# Patient Record
Sex: Male | Born: 1965 | Race: Black or African American | Hispanic: No | Marital: Married | Smoking: Never smoker
Health system: Southern US, Community
[De-identification: ages and names within clinical notes are randomized; demographics above are authoritative.]

## PROBLEM LIST (undated history)

## (undated) DIAGNOSIS — E669 Obesity, unspecified: Secondary | ICD-10-CM

## (undated) DIAGNOSIS — Z87442 Personal history of urinary calculi: Secondary | ICD-10-CM

## (undated) DIAGNOSIS — D509 Iron deficiency anemia, unspecified: Secondary | ICD-10-CM

## (undated) DIAGNOSIS — D649 Anemia, unspecified: Secondary | ICD-10-CM

## (undated) DIAGNOSIS — J45909 Unspecified asthma, uncomplicated: Secondary | ICD-10-CM

## (undated) DIAGNOSIS — K219 Gastro-esophageal reflux disease without esophagitis: Secondary | ICD-10-CM

## (undated) DIAGNOSIS — E785 Hyperlipidemia, unspecified: Secondary | ICD-10-CM

## (undated) DIAGNOSIS — F419 Anxiety disorder, unspecified: Secondary | ICD-10-CM

## (undated) DIAGNOSIS — M1990 Unspecified osteoarthritis, unspecified site: Secondary | ICD-10-CM

## (undated) DIAGNOSIS — I10 Essential (primary) hypertension: Secondary | ICD-10-CM

## (undated) DIAGNOSIS — K5790 Diverticulosis of intestine, part unspecified, without perforation or abscess without bleeding: Secondary | ICD-10-CM

## (undated) HISTORY — DX: Iron deficiency anemia, unspecified: D50.9

## (undated) HISTORY — DX: Obesity, unspecified: E66.9

## (undated) HISTORY — DX: Anemia, unspecified: D64.9

## (undated) HISTORY — DX: Diverticulosis of intestine, part unspecified, without perforation or abscess without bleeding: K57.90

## (undated) HISTORY — DX: Unspecified asthma, uncomplicated: J45.909

## (undated) HISTORY — DX: Hyperlipidemia, unspecified: E78.5

## (undated) HISTORY — DX: Essential (primary) hypertension: I10

## (undated) HISTORY — DX: Unspecified osteoarthritis, unspecified site: M19.90

---

## 1992-11-18 HISTORY — PX: HERNIA REPAIR: SHX51

## 2010-07-29 ENCOUNTER — Ambulatory Visit: Payer: Self-pay | Admitting: Family Medicine

## 2010-09-03 ENCOUNTER — Encounter: Payer: Self-pay | Admitting: Family Medicine

## 2010-09-03 ENCOUNTER — Ambulatory Visit: Payer: Self-pay | Admitting: Internal Medicine

## 2010-09-03 DIAGNOSIS — Z6835 Body mass index (BMI) 35.0-35.9, adult: Secondary | ICD-10-CM

## 2010-09-03 DIAGNOSIS — I10 Essential (primary) hypertension: Secondary | ICD-10-CM

## 2010-09-03 DIAGNOSIS — E669 Obesity, unspecified: Secondary | ICD-10-CM | POA: Insufficient documentation

## 2010-09-05 ENCOUNTER — Encounter: Payer: Self-pay | Admitting: Family Medicine

## 2010-09-05 DIAGNOSIS — D509 Iron deficiency anemia, unspecified: Secondary | ICD-10-CM | POA: Insufficient documentation

## 2010-12-18 NOTE — Assessment & Plan Note (Signed)
Summary: FLU SHOT/EVM   Vital Signs:  Patient Profile:   45 Years Old Male CC:      flu shot Temp:     98.3 degrees F oral                  Current Allergies: ! * NUTSHistory of Present Illness Reason for visit: Flu shot Chief Complaint: flu shot    Social History: Married Works at [employer]  The patient and/or caregiver has been counseled thoroughly with regard to medications prescribed including dosage, schedule, interactions, rationale for use, and possible side effects and they verbalize understanding.  Diagnoses and expected course of recovery discussed and will return if not improved as expected or if the condition worsens. Patient and/or caregiver verbalized understanding.   Orders Added: 1)  Flu Vaccine 8yrs + [90658] 2)  Admin 1st Vaccine [90471] 3)  Admin 1st Vaccine [facility]) [90471S]   Influenza Vaccine    Vaccine Type: Fluvax 3+    Site: right deltoid    Mfr: GlaxoSmithKline    Dose: 0.5 ml    Route: IM    Given by: Providence Crosby LPN    Exp. Date: 03/2011    Lot #: [id number]    VIS given: 06/12/10 version given July 29, 2010.  Flu Vaccine Consent Questions    Do you have a history of severe allergic reactions to this vaccine? no    Any prior history of allergic reactions to egg and/or gelatin? no    Do you have a sensitivity to the preservative Thimersol? no    Do you have a past history of Guillan-Barre Syndrome? no    Do you currently have an acute febrile illness? no    Have you ever had a severe reaction to latex? no    Vaccine information given and explained to patient? yes

## 2010-12-18 NOTE — Letter (Signed)
Summary: [facility]: Immunoassay Fecal Occult Blood (iFOB) Order Form  [place] [facility]  [address] [place], [place] [address]   Phone: [phone]  Fax: [phone]      [facility]: Immunoassay Fecal Occult Blood (iFOB) Order Form   September 05, 2010 MRN: [id number]   Anwar Teague 05-Jun-1966   Physicican Name:_________________________  Diagnosis Code:_________790.09_________________      Eustaquio Boyden  MD

## 2010-12-18 NOTE — Assessment & Plan Note (Signed)
Summary: CPE   Vital Signs:  Patient profile:   45 year old male Height:      68.25 inches Weight:      242.25 pounds BMI:     36.70 Temp:     98.7 degrees F oral Pulse rate:   96 / minute Pulse rhythm:   regular BP sitting:   150 / 100  (left arm) Cuff size:   large  Vitals Entered By: Selena Batten Dance CMA Duncan Dull) (September 03, 2010 9:35 AM)  Serial Vital Signs/Assessments:  Time      Position  BP       Pulse  Resp  Temp     By                     152/100                        [name] Boyden  MD  CC: New patient to establish care Comments Patient is fasting and wants CPx   History of Present Illness: CC: new patient, requests CPE today.  1. elevated BP today.  no h/o HTN, never on meds.  no HA, vision changes, chest pain, tightness, urinary changes, LE swelling.    2. obesity - walking 62min/day.  thinks eats healthy.   3. bioscreen through job last month, not fasting.  told may have elevated cholesterol  preventative - had prostate checked 4 years ago, told normal.  would like recheck again as next month will be 44. colon screening - no early fmhx, no changes in stools.  tetanus shot - last done >10 years ago. flu shot - had this year already.  Current Medications (verified): 1)  None  Allergies: 1)  ! * Peanuts  Past History:  Past Medical History: ?HTN  ?HLD  asthma as child  Past Surgical History: hernia repair 1994  Family History: M: cervical CA F: alive and well MGF: CAD/MI at 46yo  No HTN, HLD, DM, CVA, other CA  Social History: No smoking, no etOH, no rec drugs Occupation: Diplomatic Services operational officer, billing specialist Lives with wife and son  Review of Systems  The patient denies anorexia, fever, weight loss, weight gain, vision loss, decreased hearing, hoarseness, chest pain, syncope, dyspnea on exertion, peripheral edema, prolonged cough, headaches, hemoptysis, abdominal pain, melena, hematochezia, severe indigestion/heartburn, hematuria, incontinence,  muscle weakness, suspicious skin lesions, transient blindness, difficulty walking, depression, and testicular masses.    Physical Exam  General:  Well-developed,well-nourished,in no acute distress; alert,appropriate and cooperative throughout examination Head:  Normocephalic and atraumatic without obvious abnormalities. No apparent alopecia or balding. Eyes:  No corneal or conjunctival inflammation noted. EOMI. Perrla.  Ears:  External ear exam shows no significant lesions or deformities.  Otoscopic examination reveals clear canals, tympanic membranes are intact bilaterally without bulging, retraction, inflammation or discharge. Hearing is grossly normal bilaterally. Nose:  External nasal examination shows no deformity or inflammation. Nasal mucosa are pink and moist without lesions or exudates. Mouth:  Oral mucosa and oropharynx without lesions or exudates.  Teeth in good repair. Neck:  No deformities, masses, or tenderness noted. Lungs:  Normal respiratory effort, chest expands symmetrically. Lungs are clear to auscultation, no crackles or wheezes. Heart:  Normal rate and regular rhythm. S1 and S2 normal without gallop, murmur, click, rub or other extra sounds. Abdomen:  Bowel sounds positive,abdomen soft and non-tender without masses, organomegaly or hernias noted.  obese Rectal:  No external abnormalities noted. Normal sphincter tone. No  rectal masses or tenderness. Prostate:  Prostate gland firm and smooth, no enlargement, nodularity, tenderness, mass, asymmetry or induration. Msk:  No deformity or scoliosis noted of thoracic or lumbar spine.   Pulses:  2+ rad pulses Extremities:  no edema Neurologic:  CN grossly intact, station and gait intact Skin:  no rash/jaundice Psych:  pleasant, full affect   Impression & Recommendations:  Problem # 1:  HEALTH MAINTENANCE EXAM (ICD-V70.0) Reviewed preventive care protocols, scheduled due services, and updated immunizations.  Tdap today.   Prostate check today.  Orders: TLB-CBC Platelet - w/Differential (85025-CBCD)  Problem # 2:  SPECIAL SCREENING MALIGNANT NEOPLASM OF PROSTATE (ICD-V76.44)  DRE reassuring, check PSA.  Orders: TLB-PSA (Prostate Specific Antigen) (84153-PSA)  Problem # 3:  ELEVATED BP READING WITHOUT DX HYPERTENSION (ICD-796.2) pt prefers to stay away from meds if possible, requests a few months to try lifestyle cahnges, increased exercise and diet modifications (rec eat more fruits/vegetables, stay away from salt - 2gm Na per day, less juices for weight loss).  RTC 2-3 mo for f/u.  If remains elevated, rec start med.  Likely start HCTZ.  basic blood work today. Orders: TLB-Lipid Panel (80061-LIPID) TLB-BMP (Basic Metabolic Panel-BMET) (80048-METABOL) TLB-Hepatic/Liver Function Pnl (80076-HEPATIC) TLB-TSH (Thyroid Stimulating Hormone) (84443-TSH) TLB-CBC Platelet - w/Differential (85025-CBCD)  BP today: 150/100  Instructed in low sodium diet (DASH Handout) and behavior modification.    Problem # 4:  OBESITY (ICD-278.00) see above.  rec weight loss, increased exercise (currently walking/day), increase fruits/vegetables, decreased salt.  Orders: TLB-CBC Platelet - w/Differential (85025-CBCD)  Ht: 68.25 (09/03/2010)   Wt: 242.25 (09/03/2010)   BMI: 36.70 (09/03/2010)  Other Orders: Tdap => 74yrs IM (32951) Admin 1st Vaccine (88416)  Patient Instructions: 1)  Return in 2-3 months for follow up blood pressure. 2)  Tetanus shot today. 3)  PSA level with cholesterol, as well as sugar to check at [facility]. 4)  Keep track of blood pressure for next couple months. 5)  Limit sodium in your diet to 2000mg /day.  Increase potassium in diet (more fruits and vegetable). 6)  If blood pressure consistently >150/100, I'd probably want to see you sooner.   7)  Good to meet you today, call clinic with questions.   Orders Added: 1)  TLB-Lipid Panel [80061-LIPID] 2)  TLB-BMP (Basic Metabolic Panel-BMET)  [80048-METABOL] 3)  TLB-Hepatic/Liver Function Pnl [80076-HEPATIC] 4)  TLB-TSH (Thyroid Stimulating Hormone) [84443-TSH] 5)  TLB-PSA (Prostate Specific Antigen) [60630-ZSW] 6)  TLB-CBC Platelet - w/Differential [85025-CBCD] 7)  New Patient 40-64 years [99386] 8)  Tdap => 65yrs IM [90715] 9)  Admin 1st Vaccine [90471]   Immunizations Administered:  Tetanus Vaccine:    Vaccine Type: Tdap    Site: left deltoid    Mfr: GlaxoSmithKline    Dose: 0.5 ml    Route: IM    Given by: Selena Batten Dance CMA (AAMA)    Exp. Date: 09/06/2012    Lot #: [id number]    VIS given: 10/05/08 version given September 03, 2010.   Immunizations Administered:  Tetanus Vaccine:    Vaccine Type: Tdap    Site: left deltoid    Mfr: GlaxoSmithKline    Dose: 0.5 ml    Route: IM    Given by: Selena Batten Dance CMA (AAMA)    Exp. Date: 09/06/2012    Lot #: [id number]    VIS given: 10/05/08 version given September 03, 2010.  Prior Medications: None Current Allergies (reviewed today): ! * PEANUTS

## 2011-08-26 ENCOUNTER — Encounter: Payer: Self-pay | Admitting: Family Medicine

## 2011-08-27 ENCOUNTER — Encounter: Payer: Self-pay | Admitting: Family Medicine

## 2011-08-27 ENCOUNTER — Ambulatory Visit (INDEPENDENT_AMBULATORY_CARE_PROVIDER_SITE_OTHER): Payer: 59 | Admitting: Family Medicine

## 2011-08-27 DIAGNOSIS — I10 Essential (primary) hypertension: Secondary | ICD-10-CM

## 2011-08-27 DIAGNOSIS — J329 Chronic sinusitis, unspecified: Secondary | ICD-10-CM

## 2011-08-27 MED ORDER — FLUTICASONE PROPIONATE 50 MCG/ACT NA SUSP: 2.0000 | Freq: Every day | NASAL | Status: DC

## 2011-08-27 MED ORDER — HYDROCHLOROTHIAZIDE 12.5 MG PO CAPS: 12.5000 mg | ORAL_CAPSULE | Freq: Every day | ORAL | Status: DC

## 2011-08-27 NOTE — Assessment & Plan Note (Signed)
Going on 6 days. Likely viral. Supportive care. Treat with flonase as well given turbinate swelling. Update if red flags, call us end of week if not improving as expected for abx course.

## 2011-08-27 NOTE — Patient Instructions (Signed)
You have a sinus infection. Take medicine as prescribed: flonase Push fluids and plenty of rest. Nasal saline irrigation or neti pot to help drain sinuses. May use simple mucinex with plenty of fluid to help mobilize mucous. Let me know if fever >101.5, trouble opening/closing mouth, difficulty swallowing, or worsening. For blood pressure - start hydrochlorothiazide 12.5mg  once daily for blood pressure.  Limit salt intake to <1.5gm sodium/day.  Drink plenty of water.  Weight loss would help with blood pressure as well. Call me late this week if things not improving as expected.

## 2011-08-27 NOTE — Assessment & Plan Note (Signed)
Several elevated readings in last year.   Start HCTZ. Discussed low salt diet.

## 2011-08-27 NOTE — Progress Notes (Signed)
  Subjective:    Patient ID: [name] [name], male    DOB: 10-Oct-1966, 45 y.o.   MRN: [id number]  HPI CC: f/u HTN  Health screenings at work - bp elevated.  Asked to come in for check.  No HA, vision changes, CP/tightness, SOB, leg swelling.  Was high at last year's CPE, never returned for f/u.  Sinus - noticed 7days ago.  Sinus congestion, pressure, worse with sleep at night with sore gums, body aches, pain.  Tylenol not helping.  Using pseudophed as well.  Hasn't tried nasal saline.  No sick contacts at home.  No smokers at home.  H/o asthma as child, grew out of this.  No cough, abd pain.  Scheduled to return for cpe in next few months.  Never returned stool kit last year.  Was microcytic without anemia.  Review of Systems Per HPI    Objective:   Physical Exam  Nursing note and vitals reviewed. Constitutional: He appears well-developed and well-nourished. No distress.  HENT:  Head: Normocephalic and atraumatic.  Right Ear: Hearing, tympanic membrane, external ear and ear canal normal.  Left Ear: Hearing, tympanic membrane, external ear and ear canal normal.  Nose: Mucosal edema present. No rhinorrhea. Right sinus exhibits frontal sinus tenderness. Right sinus exhibits no maxillary sinus tenderness. Left sinus exhibits frontal sinus tenderness. Left sinus exhibits no maxillary sinus tenderness.  Mouth/Throat: Uvula is midline, oropharynx is clear and moist and mucous membranes are normal. No oropharyngeal exudate, posterior oropharyngeal edema, posterior oropharyngeal erythema or tonsillar abscesses.       Mild sinus pressure R>L turbinate swelling  Eyes: Conjunctivae and EOM are normal. Pupils are equal, round, and reactive to light. No scleral icterus.  Neck: Normal range of motion. Neck supple. Carotid bruit is not present. No thyromegaly present.  Cardiovascular: Normal rate, regular rhythm, normal heart sounds and intact distal pulses.   No murmur heard. Pulmonary/Chest: Effort  normal and breath sounds normal. No respiratory distress. He has no wheezes. He has no rales.  Abdominal: Soft. Bowel sounds are normal.       No abd/renal bruits  Musculoskeletal: He exhibits no edema.  Lymphadenopathy:    He has no cervical adenopathy.  Skin: Skin is warm and dry. No rash noted.  Psychiatric: He has a normal mood and affect.          Assessment & Plan:

## 2011-08-29 ENCOUNTER — Telehealth: Payer: Self-pay | Admitting: *Deleted

## 2011-08-29 MED ORDER — AMOXICILLIN-POT CLAVULANATE 875-125 MG PO TABS: 1.0000 | ORAL_TABLET | Freq: Two times a day (BID) | ORAL | Status: AC

## 2011-08-29 NOTE — Telephone Encounter (Signed)
Will send in course of augmentin twice daily for 10 days. Has he tried ibuprfoen/advil for pain?  I'd recommend 400-600mg  2-3 times/day.  Let me know if he wants something stronger.

## 2011-08-29 NOTE — Telephone Encounter (Signed)
Pt was seen on 10/9 for sinusitis. He says he is not any better, his teeth are hurting and he is requesting an antibiotic and something for pain.  Says he discussed this with you at his visit.  Uses [facility] [address].

## 2011-08-29 NOTE — Telephone Encounter (Signed)
Message left on VM advising patient of Rx and to try 400-600 mg of ibuprofen for pain and that should help in combination with the abx. I instructed him to call back if that doesn't help or if he has any questions or concerns.

## 2011-09-09 ENCOUNTER — Encounter: Payer: Self-pay | Admitting: Family Medicine

## 2011-09-23 ENCOUNTER — Ambulatory Visit (INDEPENDENT_AMBULATORY_CARE_PROVIDER_SITE_OTHER): Payer: 59 | Admitting: Family Medicine

## 2011-09-23 ENCOUNTER — Encounter: Payer: Self-pay | Admitting: Family Medicine

## 2011-09-23 DIAGNOSIS — Z0000 Encounter for general adult medical examination without abnormal findings: Secondary | ICD-10-CM | POA: Insufficient documentation

## 2011-09-23 DIAGNOSIS — E785 Hyperlipidemia, unspecified: Secondary | ICD-10-CM | POA: Insufficient documentation

## 2011-09-23 DIAGNOSIS — I10 Essential (primary) hypertension: Secondary | ICD-10-CM

## 2011-09-23 DIAGNOSIS — R718 Other abnormality of red blood cells: Secondary | ICD-10-CM

## 2011-09-23 DIAGNOSIS — Z0001 Encounter for general adult medical examination with abnormal findings: Secondary | ICD-10-CM | POA: Insufficient documentation

## 2011-09-23 DIAGNOSIS — Z1211 Encounter for screening for malignant neoplasm of colon: Secondary | ICD-10-CM

## 2011-09-23 MED ORDER — HYDROCHLOROTHIAZIDE 25 MG PO TABS: 25.0000 mg | ORAL_TABLET | Freq: Every day | ORAL | Status: DC

## 2011-09-23 NOTE — Assessment & Plan Note (Signed)
Increase HCTZ to 25mg  daily as still somewhat elevated. rtc 3 mo for /fu.

## 2011-09-23 NOTE — Assessment & Plan Note (Signed)
Reviewed last year's numbers. Advised to get blood work fasting. Goal for him <130, ideally <100.  (obesity, HTN, fmhx). Low chol diet provided today.

## 2011-09-23 NOTE — Progress Notes (Signed)
Subjective:    Patient ID: [name] [name], male    DOB: Oct 24, 1966, 45 y.o.   MRN: [id number]  HPI CC: CPE  [name] [name] presents today for CPE.    Started on HCTZ last month for elevated bp readings and dx HTN.    Good diet - fish 2x/wk, limits red meat to 2/wk.  Tries to eat sweet potatoes instead of rice, wheat instead of white.  Tries to stay active by walking 29min/day.  Thinks could increase amount of walking.  Thinks portion sizes are all right.  Wt Readings from Last 3 Encounters:  09/23/11 247 lb 1.9 oz (112.093 kg)  08/27/11 244 lb (110.678 kg)  09/03/10 242 lb 4 oz (109.884 kg)   Found to have microcytosis without anemia last year, never returned iFOB or had iron panel drawn.  States will do this year.  Preventative Prostate - desires check yearly. colon screening - no early fmhx, no changes in stools.   Did not turn in iFOB last year, states will turn in today. tetanus shot - 2011 flu shot - at [facility].  Asks if I can sign temporary handicap placard for father who will be visiting this winter.  Advised to mail to his father's physician to sign, if any problems, to let me know.  Medications and allergies reviewed and updated in chart.  Past histories reviewed and updated if relevant as below. Patient Active Problem List  Diagnoses  . OBESITY  . MICROCYTOSIS  . HTN (hypertension)  . Sinusitis   Past Medical History  Diagnosis Date  . HTN (hypertension)   . Microcytosis   . Obesity, unspecified   . HLD (hyperlipidemia)   . Childhood asthma    Past Surgical History  Procedure Date  . Hernia repair 1994   History  Substance Use Topics  . Smoking status: Never Smoker   . Smokeless tobacco: Not on file  . Alcohol Use: No   Family History  Problem Relation Age of Onset  . Cervical cancer Mother   . Healthy Father   . Coronary artery disease Maternal Grandfather   . Heart attack Maternal Grandfather 56  . Hypertension Neg Hx   . Hyperlipidemia Neg Hx     . Diabetes Neg Hx   . Stroke Neg Hx    Allergies  Allergen Reactions  . Peanut-Containing Drug Products     REACTION: swelling   Current Outpatient Prescriptions on File Prior to Visit  Medication Sig Dispense Refill  . fluticasone (FLONASE) 50 MCG/ACT nasal spray Place 2 sprays into the nose daily.  16 g  3   Review of Systems  Constitutional: Negative for fever, chills, activity change, appetite change, fatigue and unexpected weight change.  HENT: Negative for hearing loss and neck pain.   Eyes: Negative for visual disturbance.  Respiratory: Negative for cough, chest tightness, shortness of breath and wheezing.   Cardiovascular: Negative for chest pain, palpitations and leg swelling.  Gastrointestinal: Negative for nausea, vomiting, abdominal pain, diarrhea, constipation, blood in stool and abdominal distention.  Genitourinary: Negative for hematuria and difficulty urinating.  Musculoskeletal: Negative for myalgias and arthralgias.  Skin: Negative for rash.  Neurological: Negative for dizziness, seizures, syncope and headaches.  Hematological: Does not bruise/bleed easily.  Psychiatric/Behavioral: Negative for dysphoric mood. The patient is not nervous/anxious.        Objective:   Physical Exam  Nursing note and vitals reviewed. Constitutional: He is oriented to person, place, and time. He appears well-developed and well-nourished. No distress.  HENT:  Head: Normocephalic and atraumatic.  Right Ear: External ear normal.  Left Ear: External ear normal.  Nose: Nose normal.  Mouth/Throat: Oropharynx is clear and moist. No oropharyngeal exudate.  Eyes: Conjunctivae and EOM are normal. Pupils are equal, round, and reactive to light. No scleral icterus.  Neck: Normal range of motion. Neck supple. No thyromegaly present.  Cardiovascular: Normal rate, regular rhythm, normal heart sounds and intact distal pulses.   No murmur heard. Pulses:      Radial pulses are 2+ on the right  side, and 2+ on the left side.  Pulmonary/Chest: Effort normal and breath sounds normal. No respiratory distress. He has no wheezes. He has no rales.  Abdominal: Soft. Bowel sounds are normal. He exhibits no distension and no mass. There is no tenderness. There is no rebound and no guarding.  Genitourinary: Prostate normal. Rectal exam shows external hemorrhoid. Rectal exam shows no internal hemorrhoid, no fissure, no mass, no tenderness and anal tone normal. Guaiac negative stool. Prostate is not enlarged and not tender.       20-30gm prostate  Musculoskeletal: Normal range of motion.  Lymphadenopathy:    He has no cervical adenopathy.  Neurological: He is alert and oriented to person, place, and time.       CN grossly intact, station and gait intact  Skin: Skin is warm and dry. No rash noted.  Psychiatric: He has a normal mood and affect. His behavior is normal. Judgment and thought content normal.      Assessment & Plan:

## 2011-09-23 NOTE — Patient Instructions (Addendum)
Increase hydrochlorothiazide to 25mg  daily (2 pills daily, new dose will be 25mg  so will need only one pill a day). Your bad cholesterol was 158 last year, goal for you is <130.  Low cholesterol diet provided today. Return in 3 months for follow up blood pressure. Stool kit provided today. Good to see you today, call us with questions.

## 2011-09-23 NOTE — Assessment & Plan Note (Signed)
Never f/u last year.  Set up with iron panel as well as iFOB today. No fmhx colon cancer.  Hemoccult negative today.

## 2011-09-23 NOTE — Assessment & Plan Note (Addendum)
Reviewed preventative protocols and updated. Requests yearly prostate screen.  DRE today, to get PSA at [facility]. tdap last year 2011 Flu this year at [facility] Sent iFOB to [facility]. Discussed healthy living and diet, recommended low cholesterol diet.

## 2011-09-30 ENCOUNTER — Encounter: Payer: Self-pay | Admitting: Family Medicine

## 2011-10-01 ENCOUNTER — Telehealth: Payer: Self-pay | Admitting: Family Medicine

## 2011-10-01 NOTE — Telephone Encounter (Signed)
Patient notified. He said he sent in the iFOB on Thursday of last week, so we should be getting results soon. He said he wanted to investigate GI doctors and check with his insurance. He said he will call me back with where he wants to go. Copy of labs mailed to patient as requested.

## 2011-10-01 NOTE — Telephone Encounter (Signed)
Please notify has mild anemia - with low iron stores.  Has he turned in iFOB to [facility]?  Because I have not received results yet.  I would like to set him up with a screening colonoscopy given blood count has decreased some compared to last year. Kidneys, sugar, prostate normal. Cholesterol levels improved from last year, still recommend low cholesterol diet. Offer to send copy of blood work.

## 2011-10-01 NOTE — Telephone Encounter (Signed)
Message left for patient to return my call.  

## 2011-10-01 NOTE — Telephone Encounter (Signed)
Noted  

## 2011-10-14 ENCOUNTER — Telehealth: Payer: Self-pay | Admitting: *Deleted

## 2011-10-14 DIAGNOSIS — R718 Other abnormality of red blood cells: Secondary | ICD-10-CM

## 2011-10-14 DIAGNOSIS — Z1211 Encounter for screening for malignant neoplasm of colon: Secondary | ICD-10-CM

## 2011-10-14 NOTE — Telephone Encounter (Signed)
Patient dropped off temporary handicapped placard paperwork for his father.

## 2011-10-14 NOTE — Telephone Encounter (Signed)
And pt states he wants to go to Dr. Niel Hummer in [place] for colonoscopy. Pt states checked with father's doctor who didn't want to fill out handicap placard. Filled out temporary placard for 6 mo.  Indication - unable to ambulate without use of cane.

## 2011-11-06 ENCOUNTER — Encounter: Payer: Self-pay | Admitting: Radiology

## 2011-11-18 ENCOUNTER — Encounter: Payer: Self-pay | Admitting: Family Medicine

## 2012-10-19 ENCOUNTER — Other Ambulatory Visit: Payer: Self-pay | Admitting: Family Medicine

## 2012-10-19 ENCOUNTER — Encounter: Payer: Self-pay | Admitting: Family Medicine

## 2012-10-19 ENCOUNTER — Ambulatory Visit (INDEPENDENT_AMBULATORY_CARE_PROVIDER_SITE_OTHER): Payer: 59 | Admitting: Family Medicine

## 2012-10-19 VITALS — BP 136/82 | HR 94 | Temp 98.2°F | Ht 69.0 in | Wt 243.8 lb

## 2012-10-19 DIAGNOSIS — Z1211 Encounter for screening for malignant neoplasm of colon: Secondary | ICD-10-CM

## 2012-10-19 DIAGNOSIS — E785 Hyperlipidemia, unspecified: Secondary | ICD-10-CM

## 2012-10-19 DIAGNOSIS — I10 Essential (primary) hypertension: Secondary | ICD-10-CM

## 2012-10-19 DIAGNOSIS — Z0000 Encounter for general adult medical examination without abnormal findings: Secondary | ICD-10-CM

## 2012-10-19 DIAGNOSIS — D509 Iron deficiency anemia, unspecified: Secondary | ICD-10-CM

## 2012-10-19 DIAGNOSIS — E669 Obesity, unspecified: Secondary | ICD-10-CM

## 2012-10-19 LAB — POC HEMOCCULT BLD/STL (OFFICE/1-CARD/DIAGNOSTIC): Fecal Occult Blood, POC: NEGATIVE

## 2012-10-19 MED ORDER — HYDROCHLOROTHIAZIDE 25 MG PO TABS: 25.0000 mg | ORAL_TABLET | Freq: Every day | ORAL | Status: DC

## 2012-10-19 NOTE — Patient Instructions (Addendum)
Get blood work at [facility] - fasting. I recommend colonoscopy for iron deficiency anemia found last year. Return in 1 year or as needed. Good to see you today, call us with questions.

## 2012-10-19 NOTE — Assessment & Plan Note (Signed)
Discussed concerns with this dx based on last year's blood work.  Pt has not had colonoscopy done yet.  Again recommended do this.  Pt opts to start with blood work again, aware of concern for colon cancer among other dx's. Will again refer to GI for IDA. Stool kit neg in office today.

## 2012-10-19 NOTE — Assessment & Plan Note (Signed)
Chronic.  Stable, continue HCTZ 25mg  daily.  Refilled today.

## 2012-10-19 NOTE — Progress Notes (Signed)
Subjective:    Patient ID: [name] [name], male    DOB: October 29, 1966, 46 y.o.   MRN: [id number]  HPI CC: CPE  H/o IDA - found last year.  Pt was advised to go to GI for colonoscopy - has not done.  Prior wanted to go to Dr. Niel Hummer.  Denies bleeding from anywhere.  Endorses good red meat intake.  Preventative  Prostate - desires check yearly.   colon screening - no early fmhx, no changes in stools. Did not turn in iFOB last year, states will turn in today.  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 Diet: overall healthy per pt, good water, fruits/vegetables daily, fish 1x/wk  Wt Readings from Last 3 Encounters:  10/19/12 243 lb 12 oz (110.564 kg)  09/23/11 247 lb 1.9 oz (112.093 kg)  08/27/11 244 lb (110.678 kg)    Medications and allergies reviewed and updated in chart.  Past histories reviewed and updated if relevant as below. Patient Active Problem List  Diagnosis  . OBESITY  . MICROCYTOSIS  . HTN (hypertension)  . Sinusitis  . Healthcare maintenance  . HLD (hyperlipidemia)   Past Medical History  Diagnosis Date  . HTN (hypertension)   . Microcytosis   . Obesity, unspecified   . HLD (hyperlipidemia)   . Childhood asthma    Past Surgical History  Procedure Date  . Hernia repair 1994   History  Substance Use Topics  . Smoking status: Never Smoker   . Smokeless tobacco: Not on file  . Alcohol Use: No   Family History  Problem Relation Age of Onset  . Cervical cancer Mother   . Healthy Father   . Coronary artery disease Maternal Grandfather   . Heart attack Maternal Grandfather 56  . Hypertension Neg Hx   . Hyperlipidemia Neg Hx   . Diabetes Neg Hx   . Stroke Neg Hx    Allergies  Allergen Reactions  . Peanut-Containing Drug Products     REACTION: swelling   Current Outpatient Prescriptions on File Prior to Visit  Medication Sig  Dispense Refill  . fluticasone (FLONASE) 50 MCG/ACT nasal spray Place 2 sprays into the nose daily.  16 g  3  . hydrochlorothiazide (HYDRODIURIL) 25 MG tablet Take 1 tablet (25 mg total) by mouth daily.  90 tablet  3     Review of Systems  Constitutional: Negative for fever, chills, activity change, appetite change, fatigue and unexpected weight change.  HENT: Negative for hearing loss and neck pain.   Eyes: Negative for visual disturbance.  Respiratory: Negative for cough, chest tightness, shortness of breath and wheezing.   Cardiovascular: Negative for chest pain, palpitations and leg swelling.  Gastrointestinal: Negative for nausea, vomiting, abdominal pain, diarrhea, constipation, blood in stool and abdominal distention.  Genitourinary: Negative for hematuria and difficulty urinating.  Musculoskeletal: Negative for myalgias and arthralgias.  Skin: Negative for rash.  Neurological: Negative for dizziness, seizures, syncope and headaches.  Hematological: Does not bruise/bleed easily.  Psychiatric/Behavioral: Negative for dysphoric mood. The patient is not nervous/anxious.        Objective:   Physical Exam  Nursing note and vitals reviewed. Constitutional: He is oriented to person, place, and time. He appears well-developed and well-nourished. No distress.  HENT:  Head: Normocephalic and atraumatic.  Right Ear: Hearing, tympanic membrane, external ear and ear canal normal.  Left Ear: Hearing, tympanic  membrane, external ear and ear canal normal.  Nose: Nose normal.  Mouth/Throat: Oropharynx is clear and moist. No oropharyngeal exudate.  Eyes: Conjunctivae normal and EOM are normal. Pupils are equal, round, and reactive to light. No scleral icterus.  Neck: Normal range of motion. Neck supple. No thyromegaly present.  Cardiovascular: Normal rate, regular rhythm, normal heart sounds and intact distal pulses.   No murmur heard. Pulses:      Radial pulses are 2+ on the right side, and  2+ on the left side.  Pulmonary/Chest: Effort normal and breath sounds normal. No respiratory distress. He has no wheezes. He has no rales.  Abdominal: Soft. Bowel sounds are normal. He exhibits no distension and no mass. There is no tenderness. There is no rebound and no guarding.  Genitourinary: Prostate normal. Rectal exam shows external hemorrhoid (noninflamed). Rectal exam shows no internal hemorrhoid, no fissure, no mass, no tenderness and anal tone normal. Guaiac negative stool. Prostate is not enlarged (20gm) and not tender.  Musculoskeletal: Normal range of motion. He exhibits no edema.  Lymphadenopathy:    He has no cervical adenopathy.  Neurological: He is alert and oriented to person, place, and time.       CN grossly intact, station and gait intact  Skin: Skin is warm and dry. No rash noted. No pallor.  Psychiatric: He has a normal mood and affect. His behavior is normal. Judgment and thought content normal.       Assessment & Plan:

## 2012-10-19 NOTE — Assessment & Plan Note (Signed)
Preventative protocols reviewed and updated unless pt declined. Discussed healthy diet and lifestyle.  

## 2012-10-19 NOTE — Assessment & Plan Note (Signed)
Mild, diet controlled.  Goal LDL <130, ideally lower given fmhx.

## 2012-10-19 NOTE — Assessment & Plan Note (Signed)
Encourage continued weight loss through healthy diet and increased activity.

## 2012-10-22 LAB — BASIC METABOLIC PANEL
CO2: 24 mmol/L (ref 19–28)
Calcium: 9.6 mg/dL (ref 8.7–10.2)
Creatinine, Ser: 1.38 mg/dL — ABNORMAL HIGH (ref 0.76–1.27)
GFR calc non Af Amer: 60 mL/min/{1.73_m2} (ref >59)
Glucose: 97 mg/dL (ref 65–99)
Potassium: 4 mmol/L (ref 3.5–5.2)
Sodium: 138 mmol/L (ref 134–144)

## 2012-10-22 LAB — CBC/DIFF AMBIGUOUS DEFAULT
Basophils Absolute: 0 10*3/uL (ref 0.0–0.2)
HCT: 36.9 % — ABNORMAL LOW (ref 37.5–51.0)
Immature Grans (Abs): 0 10*3/uL (ref 0.0–0.1)
Immature Granulocytes: 0 % (ref 0–2)
Lymphs: 20 % (ref 14–46)
Monocytes: 10 % (ref 4–12)
Neutrophils Absolute: 4.5 10*3/uL (ref 1.4–7.0)
Platelets: 256 10*3/uL (ref 155–379)
RDW: 15.4 % (ref 12.3–15.4)
WBC: 6.6 10*3/uL (ref 3.4–10.8)

## 2012-10-22 LAB — PSA: PSA: 1.3 ng/mL (ref 0.0–4.0)

## 2012-10-22 LAB — IRON AND TIBC
Iron: 84 ug/dL (ref 40–155)
UIBC: 297 ug/dL (ref 150–375)

## 2012-10-22 LAB — LIPID PANEL W/O CHOL/HDL RATIO: Cholesterol, Total: 225 mg/dL — ABNORMAL HIGH (ref 100–199)

## 2012-10-22 LAB — FERRITIN: Ferritin: 20 ng/mL — ABNORMAL LOW (ref 30–400)

## 2012-10-25 ENCOUNTER — Other Ambulatory Visit: Payer: Self-pay | Admitting: Family Medicine

## 2012-10-25 DIAGNOSIS — D509 Iron deficiency anemia, unspecified: Secondary | ICD-10-CM

## 2012-12-19 HISTORY — PX: COLONOSCOPY: SHX174

## 2012-12-21 LAB — HM COLONOSCOPY

## 2013-09-23 ENCOUNTER — Other Ambulatory Visit: Payer: Self-pay | Admitting: Family Medicine

## 2013-12-24 ENCOUNTER — Other Ambulatory Visit: Payer: Self-pay | Admitting: *Deleted

## 2013-12-24 MED ORDER — HYDROCHLOROTHIAZIDE 25 MG PO TABS: ORAL_TABLET | ORAL | Status: DC

## 2013-12-24 NOTE — Telephone Encounter (Signed)
Pt requesting medication refill.  Upcoming CPE 12/31/2013, last ov 10/2012. pls advise

## 2013-12-31 ENCOUNTER — Encounter: Payer: 59 | Admitting: Family Medicine

## 2014-02-09 ENCOUNTER — Encounter: Payer: 59 | Admitting: Family Medicine

## 2014-03-17 ENCOUNTER — Ambulatory Visit (INDEPENDENT_AMBULATORY_CARE_PROVIDER_SITE_OTHER): Payer: 59 | Admitting: Family Medicine

## 2014-03-17 ENCOUNTER — Encounter: Payer: Self-pay | Admitting: Family Medicine

## 2014-03-17 VITALS — BP 128/86 | HR 90 | Temp 98.1°F | Wt 242.0 lb

## 2014-03-17 DIAGNOSIS — I10 Essential (primary) hypertension: Secondary | ICD-10-CM

## 2014-03-17 DIAGNOSIS — D509 Iron deficiency anemia, unspecified: Secondary | ICD-10-CM

## 2014-03-17 MED ORDER — HYDROCHLOROTHIAZIDE 25 MG PO TABS: ORAL_TABLET | ORAL | Status: DC

## 2014-03-17 NOTE — Progress Notes (Signed)
Pre visit review using our clinic review tool, if applicable. No additional management support is needed unless otherwise documented below in the visit note. 

## 2014-03-17 NOTE — Patient Instructions (Signed)
Sign release for copy of colonoscopy from last year from Dr. Niel [facility]merIftik[facility] Script provided today for labs from [facility]. Hydrochlorothiazide refilled. Good to see you today, return in 1 year for physical or as needed.  Iron Deficiency Anemia, Adult Anemia is a condition in which there are less red blood cells or hemoglobin in the blood than normal. Hemoglobin is this part of red blood cells that carries oxygen. Iron deficiency anemia is anemia caused by too little iron. It is the most common type of anemia. It may leave you tired and short of breath. CAUSES   Lack of iron in the diet.  Poor absorption of iron, as seen with intestinal disorders.  Intestinal bleeding.  Heavy periods. SIGNS AND SYMPTOMS  Mild anemia may not be noticeable. Symptoms may include:  Fatigue.  Headache.  Pale skin.  Weakness.  Tiredness.  Shortness of breath.  Dizziness.  Cold hands and feet.  Fast or irregular heartbeat. DIAGNOSIS  Diagnosis requires a thorough evaluation and physical exam by your health care provider. Blood tests are generally used to confirm iron deficiency anemia. Additional tests may be done to find the underlying cause of your anemia. These may include:  Testing for blood in the stool (fecal occult blood test).  A procedure to see inside the colon and rectum (colonoscopy).  A procedure to see inside the esophagus and stomach (endoscopy). TREATMENT  Iron deficiency anemia is treated by correcting the cause of the deficiency. Treatment may involve:  Adding iron-rich foods to your diet.  Taking iron supplements. Pregnant or breastfeeding women need to take extra iron, because their normal diet usually does not provide the required amount.  Taking vitamins. Vitamin C improves the absorption of iron. Your health care provider may recommend taking your iron tablets with a glass of orange juice or vitamin C supplement.  Medicines to make heavy menstrual flow  lighter.  Surgery. HOME CARE INSTRUCTIONS   Take iron as directed by your health care provider.  If you cannot tolerate taking iron supplements by mouth, talk to your health care provider about taking them through a vein (intravenously) or an injection into a muscle.  For the best iron absorption, iron supplements should be taken on an empty stomach. If you cannot tolerate them on an empty stomach, you may need to take them with food.  Do not drink milk or take antacids at the same time as your iron supplements. Milk and antacids may interfere with the absorption of iron.  Iron supplements can cause constipation. Make sure to include fiber in your diet to prevent constipation. A stool softener may also be recommended.  Take vitamins as directed by your health care provider.  Eat a diet rich in iron. Foods high in iron include liver, lean beef, whole-grain bread, eggs, dried fruit, and dark green, leafy vegetables. SEEK IMMEDIATE MEDICAL CARE IF:   You faint. If this happens, do not drive. Call your local emergency services (911 in [place] if no other help is available.  You have chest pain.  You feel nauseous or vomit.  You have severe or increased shortness of breath with activity.  You feel weak.  You have a rapid heartbeat.  You have unexplained sweating.  You become lightheaded when getting up from a chair or bed. MAKE SURE YOU:   Understand these instructions.  Will watch your condition.  Will get help right away if you are not doing well or get worse. Document Released: 11/01/2000 Document Revised: 08/25/2013 Document Reviewed:  07/12/2013 Exit[employer] Patient Information 2014 [employer] [place]

## 2014-03-17 NOTE — Assessment & Plan Note (Signed)
Reviewed dx - recheck (CBC, iron panel with ferritin). Pt states had normal colonoscopy last year - have requested records today. Discussed causes of IDA - doubt dietary insufficiency or malabsorption.   Check UA at next labwork at [facility] (script provided today). Educational patient handout provided today

## 2014-03-17 NOTE — Assessment & Plan Note (Signed)
Chronic, stable. conitnue hctz 25mg  daily.  Check K today.

## 2014-03-17 NOTE — Progress Notes (Signed)
   BP 128/86  Pulse 90  Temp(Src) 98.1 F (36.7 C) (Oral)  Wt 242 lb (109.77 kg)   CC: med refill visit  Subjective:    Patient ID: [name] [name], male    DOB: 1966/05/27, 48 y.o.   MRN: [id number]  HPI: [name] [name] is a 48 y.o. male presenting on 03/17/2014 for Follow-up   Med refill visit today, no questions or concerns today.  Last seen here 10/2012.  HTN - Compliant with current antihypertensive regimen of hctz 25mg  daily.  Does not check blood pressures at home.  No low blood pressure symptoms of dizziness/syncope.  Denies HA, vision changes, CP/tightness, SOB, leg swelling.  Denies leg cramps.  IDA - states had normal colonoscopy done last year by Dr.Iftikhar   Obesity - Body mass index is 35.72 kg/(m^2).  Relevant past medical, surgical, family and social history reviewed and updated as indicated.  Allergies and medications reviewed and updated. No current outpatient prescriptions on file prior to visit.   No current facility-administered medications on file prior to visit.    Review of Systems  Gastrointestinal: Negative for nausea, vomiting, abdominal pain, diarrhea, constipation and blood in stool.  Genitourinary: Negative for hematuria.   Per HPI unless specifically indicated above    Objective:    BP 128/86  Pulse 90  Temp(Src) 98.1 F (36.7 C) (Oral)  Wt 242 lb (109.77 kg)  Physical Exam  Nursing note and vitals reviewed. Constitutional: He appears well-developed and well-nourished. No distress.  HENT:  Mouth/Throat: Oropharynx is clear and moist. No oropharyngeal exudate.  Neck: Normal range of motion. Neck supple. No thyromegaly present.  Cardiovascular: Normal rate, regular rhythm, normal heart sounds and intact distal pulses.   No murmur heard. Pulmonary/Chest: Effort normal and breath sounds normal. No respiratory distress. He has no wheezes. He has no rales.  Musculoskeletal: He exhibits no edema.  Skin: Skin is warm and dry. No rash noted.    Psychiatric: He has a normal mood and affect.       Assessment & Plan:   Problem List Items Addressed This Visit   Iron deficiency anemia     Reviewed dx - recheck (CBC, iron panel with ferritin). Pt states had normal colonoscopy last year - have requested records today. Discussed causes of IDA - doubt dietary insufficiency or malabsorption.   Check UA at next labwork at [facility] (script provided today). Educational patient handout provided today    HTN (hypertension) - Primary     Chronic, stable. conitnue hctz 25mg  daily.  Check K today.    Relevant Medications      hydrochlorothiazide tablet       Follow up plan: Return in about 1 year (around 03/18/2015), or as needed, for physical.

## 2014-03-18 ENCOUNTER — Telehealth: Payer: Self-pay | Admitting: Family Medicine

## 2014-03-18 ENCOUNTER — Encounter: Payer: Self-pay | Admitting: Family Medicine

## 2014-03-18 NOTE — Telephone Encounter (Signed)
Relevant patient education assigned to patient using Emmi. ° °

## 2014-03-24 ENCOUNTER — Encounter: Payer: Self-pay | Admitting: Family Medicine

## 2014-03-24 ENCOUNTER — Other Ambulatory Visit: Payer: Self-pay | Admitting: Family Medicine

## 2014-03-24 DIAGNOSIS — D509 Iron deficiency anemia, unspecified: Secondary | ICD-10-CM

## 2014-03-29 ENCOUNTER — Encounter: Payer: Self-pay | Admitting: Gastroenterology

## 2014-04-18 ENCOUNTER — Ambulatory Visit (INDEPENDENT_AMBULATORY_CARE_PROVIDER_SITE_OTHER): Payer: 59 | Admitting: Family Medicine

## 2014-04-18 ENCOUNTER — Encounter: Payer: Self-pay | Admitting: Family Medicine

## 2014-04-18 VITALS — BP 122/76 | HR 86 | Temp 97.7°F | Wt 241.0 lb

## 2014-04-18 DIAGNOSIS — D509 Iron deficiency anemia, unspecified: Secondary | ICD-10-CM

## 2014-04-18 DIAGNOSIS — M25562 Pain in left knee: Principal | ICD-10-CM

## 2014-04-18 DIAGNOSIS — M25569 Pain in unspecified knee: Secondary | ICD-10-CM

## 2014-04-18 DIAGNOSIS — M25561 Pain in right knee: Secondary | ICD-10-CM

## 2014-04-18 MED ORDER — TRAMADOL HCL 50 MG PO TABS: 50.0000 mg | ORAL_TABLET | Freq: Two times a day (BID) | ORAL | Status: DC | PRN

## 2014-04-18 NOTE — Assessment & Plan Note (Signed)
Anticipate PFPS vs patellofemoral arthritis. Treat with exercises from [facility] pt advisor on runner's knee. Rec back off NSAID 2/2 IDA currently undergoing workup. Will treat with tramadol prn in interim. Pt does not tolerate tylenol Update if not improving with treatment for referral to PT. Pt agrees with plan.

## 2014-04-18 NOTE — Progress Notes (Signed)
   BP 122/76  Pulse 86  Temp(Src) 97.7 F (36.5 C) (Oral)  Wt 241 lb (109.317 kg)  SpO2 98%   CC: knee pain  Subjective:    Patient ID: [name] [name], male    DOB: July 06, 1966, 48 y.o.   MRN: [id number]  HPI: [name] [name] is a 48 y.o. male presenting on 04/18/2014 for Knee Pain   Several month h/o bilateral R>L knee pain, worse over the past week. Worse with walking up stairs. Points to anterior knee at kneecap. Denies inciting trauma/injury. Denies locking of knee, fever.  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 Not able to wear sneakers as much as he was used to (with transition to new [facility] workplace) Self treating with ibuprofen 600-800mg  twice daily as needed. Tylenol upsets stomach.  Wt Readings from Last 3 Encounters:  04/18/14 241 lb (109.317 kg)  03/17/14 242 lb (109.77 kg)  10/19/12 243 lb 12 oz (110.564 kg)  Body mass index is 35.57 kg/(m^2).  H/o IDA - referred back to GI, scheduled appt 6/25 with Dr. Arlyce [name].  Relevant past medical, surgical, family and social history reviewed and updated as indicated.  Allergies and medications reviewed and updated. Current Outpatient Prescriptions on File Prior to Visit  Medication Sig  . hydrochlorothiazide (HYDRODIURIL) 25 MG tablet TAKE ONE TABLET BY MOUTH EVERY DAY   No current facility-administered medications on file prior to visit.    Review of Systems Per HPI unless specifically indicated above    Objective:    BP 122/76  Pulse 86  Temp(Src) 97.7 F (36.5 C) (Oral)  Wt 241 lb (109.317 kg)  SpO2 98%  Physical Exam  Nursing note and vitals reviewed. Constitutional: He appears well-developed and well-nourished. No distress.  Musculoskeletal: He exhibits no edema.  Bilateral knee exam: No deformity on inspection. Mild pain with palpation of L>R anterior knee medial to patella No effusion/swelling noted. FROM in flex/extension without crepitus. No popliteal fullness. Neg  drawer test. Mild discomfort with mcmurray on left. Tender to pressure at [facility] Mildly + PFgrind L>R No abnormal patellar mobility.    Results for orders placed in visit on 03/18/14  [facility] COLONOSCOPY      Result Value Ref Range   HM Colonoscopy [name] [name]        Assessment & Plan:   Problem List Items Addressed This Visit   Knee pain, bilateral - Primary     Anticipate PFPS vs patellofemoral arthritis. Treat with exercises from [facility] pt advisor on runner's knee. Rec back off NSAID 2/2 IDA currently undergoing workup. Will treat with tramadol prn in interim. Pt does not tolerate tylenol Update if not improving with treatment for referral to PT. Pt agrees with plan.    Iron deficiency anemia     rec back off NSAIDs until GI eval later this month.        Follow up plan: Return if symptoms worsen or fail to improve.

## 2014-04-18 NOTE — Assessment & Plan Note (Signed)
rec back off NSAIDs until GI eval later this month.

## 2014-04-18 NOTE — Patient Instructions (Signed)
Let's try and back off ibuprofen. May use tramadol up to twice daily as needed for discomfort (sedation precautions). I think you have inflammation between kneecap and knee. Do stretching and strengthening exercises provided today. Let me know if not improving - for referral to physical therapy

## 2014-04-18 NOTE — Progress Notes (Signed)
Pre-visit discussion using our clinic review tool. No additional management support is needed unless otherwise documented below in the visit note.  

## 2014-04-19 ENCOUNTER — Telehealth: Payer: Self-pay

## 2014-04-19 NOTE — Telephone Encounter (Signed)
[name] [name] with [facility] [address] said pts tramadol rx was considered tampered with due to pt cutting around rx. Verified Tramadol 50 mg with instructions take 1 tab q 12 h prn for moderate pain # 50 x 0 printed on 04/18/14. [name] [name] voiced understanding.

## 2014-05-12 ENCOUNTER — Ambulatory Visit: Payer: 59 | Admitting: Gastroenterology

## 2014-12-21 ENCOUNTER — Ambulatory Visit (INDEPENDENT_AMBULATORY_CARE_PROVIDER_SITE_OTHER): Payer: 59 | Admitting: Family Medicine

## 2014-12-21 ENCOUNTER — Encounter: Payer: Self-pay | Admitting: Family Medicine

## 2014-12-21 ENCOUNTER — Telehealth: Payer: Self-pay | Admitting: Family Medicine

## 2014-12-21 VITALS — BP 146/98 | HR 88 | Temp 98.1°F | Ht 69.0 in | Wt 235.5 lb

## 2014-12-21 DIAGNOSIS — M25562 Pain in left knee: Secondary | ICD-10-CM

## 2014-12-21 DIAGNOSIS — M25561 Pain in right knee: Secondary | ICD-10-CM

## 2014-12-21 DIAGNOSIS — Z0000 Encounter for general adult medical examination without abnormal findings: Secondary | ICD-10-CM

## 2014-12-21 DIAGNOSIS — E785 Hyperlipidemia, unspecified: Secondary | ICD-10-CM

## 2014-12-21 DIAGNOSIS — I10 Essential (primary) hypertension: Secondary | ICD-10-CM

## 2014-12-21 DIAGNOSIS — D509 Iron deficiency anemia, unspecified: Secondary | ICD-10-CM

## 2014-12-21 DIAGNOSIS — Z125 Encounter for screening for malignant neoplasm of prostate: Secondary | ICD-10-CM

## 2014-12-21 DIAGNOSIS — E669 Obesity, unspecified: Secondary | ICD-10-CM

## 2014-12-21 MED ORDER — TRAMADOL HCL 50 MG PO TABS: 50.0000 mg | ORAL_TABLET | Freq: Two times a day (BID) | ORAL | Status: DC | PRN

## 2014-12-21 MED ORDER — HYDROCHLOROTHIAZIDE 25 MG PO TABS: ORAL_TABLET | ORAL | Status: DC

## 2014-12-21 NOTE — Telephone Encounter (Signed)
Tobi BastosAnna, [facility] called re: concerns over Ambien medication.  Pt is trying to get wife's Ambien filled for his use.  Please call Tobi Bastosnna at [phone] / lt

## 2014-12-21 NOTE — Assessment & Plan Note (Signed)
Recheck levels today. Pt has backed off NSAIDs. Pt cancelled prior GI office visit.

## 2014-12-21 NOTE — Assessment & Plan Note (Signed)
Preventative protocols reviewed and updated unless pt declined. Discussed healthy diet and lifestyle.  

## 2014-12-21 NOTE — Assessment & Plan Note (Addendum)
R knee pain improved. Intermittent L knee pain flares. ?PFPS vs arthritis. Provided with temporary handicap placard for next 6 months. If needs past this will need further evaluation Per patient when he has a flare cannot walk >27900ft

## 2014-12-21 NOTE — Assessment & Plan Note (Signed)
Chronic, elevated today. Prior levels normal. Will monitor at home and notify me if persistently >140/90

## 2014-12-21 NOTE — Progress Notes (Addendum)
BP 140/100 mmHg  Pulse 88  Temp(Src) 98.1 F (36.7 C) (Oral)  Ht  (1.753 m)  Wt 235 lb 8 oz (106.822 kg)  BMI 34.76 kg/m2   CC: CPE  Subjective:    Patient ID: [name] [name], male    DOB: 17-Jul-1966, 49 y.o.   MRN: [id number]  HPI: [name] [name] is a 49 y.o. male presenting on 12/21/2014 for Annual Exam   Iron deficiency anemia - s/p normal colonoscopy, referred back to Dr Arlyce Dice 04/2014 but later cancelled appointment.  Knee pain - R knee pain has resolved. Intermittent L sharp stabbing lateral knee pain affects ability to walk. Occurs very occasionally. Requests temporary handicap placard be filled out for when flaring pain.  HTN - bp elevated today. Doesn't check at home. Walking regularly. Avoids salt in diet. No HA, vision changes, CP/tightness, SOB, leg swelling. 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 Diet: overall healthy per pt, good water, fruits/vegetables daily, fish 1x/wk   Relevant past medical, surgical, family and social history reviewed and updated as indicated. Interim medical history since our last visit reviewed. Allergies and medications reviewed and updated. Current Outpatient Prescriptions on File Prior to Visit  Medication Sig  . ibuprofen (ADVIL,MOTRIN) 200 MG tablet Take 200 mg by mouth every 6 (six) hours as needed.   No current facility-administered medications on file prior to visit.    Review of Systems  Constitutional: Negative for fever, chills, activity change, appetite change, fatigue and unexpected weight  change.  HENT: Positive for congestion. Negative for hearing loss.   Eyes: Negative for visual disturbance.  Respiratory: Negative for cough, chest tightness, shortness of breath and wheezing.   Cardiovascular: Negative for chest pain, palpitations and leg swelling.  Gastrointestinal: Negative for nausea, vomiting, abdominal pain, diarrhea, constipation, blood in stool and abdominal distention.  Genitourinary: Negative for hematuria and difficulty urinating.  Musculoskeletal: Negative for myalgias, arthralgias and neck pain.  Skin: Negative for rash.  Neurological: Negative for dizziness, seizures, syncope and headaches.  Hematological: Negative for adenopathy. Does not bruise/bleed easily.  Psychiatric/Behavioral: Negative for dysphoric mood. The patient is not nervous/anxious.    Per HPI unless specifically indicated above     Objective:    BP 140/100 mmHg  Pulse 88  Temp(Src) 98.1 F (36.7 C) (Oral)  Ht  (1.753 m)  Wt 235 lb 8 oz (106.822 kg)  BMI 34.76 kg/m2  Wt Readings from Last 3 Encounters:  12/21/14 235 lb 8 oz (106.822 kg)  04/18/14 241 lb (109.317 kg)  03/17/14 242 lb (109.77 kg)    Physical Exam  Constitutional: He is oriented to person, place, and time. He appears well-developed and well-nourished. No distress.  HENT:  Head: Normocephalic and atraumatic.  Right Ear: Hearing, tympanic membrane, external ear and ear canal normal.  Left Ear: Hearing, tympanic membrane, external ear and ear canal normal.  Nose: Nose normal.  Mouth/Throat: Uvula is midline, oropharynx is clear and moist and mucous membranes are normal. No oropharyngeal exudate, posterior oropharyngeal edema or posterior oropharyngeal erythema.  Eyes: Conjunctivae and  EOM are normal. Pupils are equal, round, and reactive to light. No scleral icterus.  Neck: Normal range of motion. Neck supple. No thyromegaly present.  Cardiovascular: Normal rate, regular rhythm, normal heart sounds and intact  distal pulses.   No murmur heard. Pulses:      Radial pulses are 2+ on the right side, and 2+ on the left side.  Pulmonary/Chest: Effort normal and breath sounds normal. No respiratory distress. He has no wheezes. He has no rales.  Abdominal: Soft. Bowel sounds are normal. He exhibits no distension and no mass. There is no tenderness. There is no rebound and no guarding.  Genitourinary: Rectum normal and prostate normal. Rectal exam shows no external hemorrhoid, no internal hemorrhoid, no fissure, no mass, no tenderness and anal tone normal. Prostate is not enlarged and not tender.  Musculoskeletal: Normal range of motion. He exhibits no edema.  Lymphadenopathy:    He has no cervical adenopathy.  Neurological: He is alert and oriented to person, place, and time.  CN grossly intact, station and gait intact  Skin: Skin is warm and dry. No rash noted.  Psychiatric: He has a normal mood and affect. His behavior is normal. Judgment and thought content normal.  Nursing note and vitals reviewed.  Results for orders placed or performed in visit on 03/18/14  HM COLONOSCOPY  Result Value Ref Range   HM Colonoscopy Iftikhar       Assessment & Plan:   Problem List Items Addressed This Visit    Obesity    Continue to encourage regular exercise and active lifestyle to affect sustainable weight loss.      Knee pain, bilateral    R knee pain improved. Intermittent L knee pain flares. ?PFPS vs arthritis. Provided with temporary handicap placard for next 6 months. If needs past this will need further evaluation Per patient when he has a flare cannot walk >24000ft      Iron deficiency anemia    Recheck levels today. Pt has backed off NSAIDs. Pt cancelled prior GI office visit.      Relevant Orders   TSH   CBC with Differential/Platelet   Ferritin   IBC panel   HTN (hypertension)    Chronic, elevated today. Prior levels normal. Will monitor at home and notify me if persistently >140/90       Relevant Medications   hydrochlorothiazide tablet   Other Relevant Orders   Comprehensive metabolic panel   HLD (hyperlipidemia)    Recheck today. Diet controlled in the past.      Relevant Medications   hydrochlorothiazide tablet   Other Relevant Orders   Lipid panel   Comprehensive metabolic panel   Healthcare maintenance - Primary    Preventative protocols reviewed and updated unless pt declined. Discussed healthy diet and lifestyle.        Other Visit Diagnoses    Special screening for malignant neoplasm of prostate        Relevant Orders    PSA        Follow up plan: Return as needed, for annual exam, prior fasting for blood work.

## 2014-12-21 NOTE — Telephone Encounter (Signed)
PLACED IN WRONG CHART - please place in correct chart - [name]. Spoke with pharmacy. Pharmacy not to fill early, pt to contact us with future requests like this.

## 2014-12-21 NOTE — Assessment & Plan Note (Signed)
Recheck today. Diet controlled in the past.

## 2014-12-21 NOTE — Patient Instructions (Addendum)
Start monitoring blood pressure at local pharmacy once every 2 weeks. If persistently >140/90 let me know. Pass by [facility] for blood work today - script provided today.

## 2014-12-21 NOTE — Progress Notes (Signed)
Pre visit review using our clinic review tool, if applicable. No additional management support is needed unless otherwise documented below in the visit note. 

## 2014-12-21 NOTE — Assessment & Plan Note (Signed)
Continue to encourage regular exercise and active lifestyle to affect sustainable weight loss.

## 2014-12-21 NOTE — Addendum Note (Signed)
Addended by: Eustaquio BoydenGUTIERREZ, Ed Rayson on: 12/21/2014 08:44 AM   Modules accepted: Kipp BroodSmartSet

## 2014-12-22 ENCOUNTER — Encounter: Payer: Self-pay | Admitting: Family Medicine

## 2015-02-20 ENCOUNTER — Ambulatory Visit (INDEPENDENT_AMBULATORY_CARE_PROVIDER_SITE_OTHER): Payer: 59 | Admitting: Family Medicine

## 2015-02-20 ENCOUNTER — Encounter: Payer: Self-pay | Admitting: Family Medicine

## 2015-02-20 VITALS — BP 146/100 | HR 92 | Temp 98.2°F | Wt 229.2 lb

## 2015-02-20 DIAGNOSIS — I10 Essential (primary) hypertension: Secondary | ICD-10-CM

## 2015-02-20 MED ORDER — AMLODIPINE BESYLATE 5 MG PO TABS: 5.0000 mg | ORAL_TABLET | Freq: Every day | ORAL | Status: DC

## 2015-02-20 NOTE — Progress Notes (Addendum)
   BP 146/100 mmHg  Pulse 92  Temp(Src) 98.2 F (36.8 C) (Oral)  Wt 229 lb 4 oz (103.987 kg)   CC: check BP  Subjective:    Patient ID: [name] [name], male    DOB: 02-Mar-1966, 49 y.o.   MRN: [id number]  HPI: [name] ShellLee Bocchino is a 49 y.o. male presenting on 02/20/2015 for Hypertension   HTN - bp elevated again today. Over past 2 weeks bp running elevated last well. Has noticed increased headaches. Every time he's checked, bp 150/100s. Walking regularly. No diet changes recently. Avoids salt in diet. No vision changes, CP/tightness, SOB, leg swelling.  BP Readings from Last 3 Encounters:  02/20/15 146/100  12/21/14 146/98  04/18/14 122/76   Relevant past medical, surgical, family and social history reviewed and updated as indicated. Interim medical history since our last visit reviewed. Allergies and medications reviewed and updated. Current Outpatient Prescriptions on File Prior to Visit  Medication Sig  . hydrochlorothiazide (HYDRODIURIL) 25 MG tablet TAKE ONE TABLET BY MOUTH EVERY DAY  . ibuprofen (ADVIL,MOTRIN) 200 MG tablet Take 200 mg by mouth every 6 (six) hours as needed.  . traMADol (ULTRAM) 50 MG tablet Take 1 tablet (50 mg total) by mouth every 12 (twelve) hours as needed for moderate pain.   No current facility-administered medications on file prior to visit.    Review of Systems Per HPI unless specifically indicated above     Objective:    BP 146/100 mmHg  Pulse 92  Temp(Src) 98.2 F (36.8 C) (Oral)  Wt 229 lb 4 oz (103.987 kg)  Wt Readings from Last 3 Encounters:  02/20/15 229 lb 4 oz (103.987 kg)  12/21/14 235 lb 8 oz (106.822 kg)  04/18/14 241 lb (109.317 kg)    Physical Exam  Constitutional: He appears well-developed and well-nourished. No distress.  HENT:  Mouth/Throat: Oropharynx is clear and moist. No oropharyngeal exudate.  Cardiovascular: Normal rate, regular rhythm, normal heart sounds and intact distal pulses.   No murmur heard. Pulmonary/Chest:  Effort normal and breath sounds normal. No respiratory distress. He has no wheezes. He has no rales.  Musculoskeletal: He exhibits no edema.  Skin: Skin is warm and dry. No rash noted.  Psychiatric: He has a normal mood and affect.  Nursing note and vitals reviewed.  Results for orders placed or performed in visit on 03/18/14  HM COLONOSCOPY  Result Value Ref Range   HM Colonoscopy Iftikhar       Assessment & Plan:   Problem List Items Addressed This Visit    HTN (hypertension) - Primary    bp again staying elevated - will add on amlodipine 5mg  daily to bp regimen. Discussed common side effects to monitor for. updat in 3-4 wks if not achieving goal bp <140/90 for increased dose. Baseline EKG today. Pt agrees with plan. EKG - NSR rate 90, mild LAD, normal axis and intervals, no acute ST/T changes.      Relevant Medications   amLODIpine (NORVASC) tablet   Other Relevant Orders   EKG 12-Lead (Completed)       Follow up plan: Return in about 3 months (around 05/22/2015).

## 2015-02-20 NOTE — Addendum Note (Signed)
Addended by: Josph MachoANCE, KIMBERLY A on: 02/20/2015 03:25 PM   Modules accepted: Orders

## 2015-02-20 NOTE — Addendum Note (Signed)
Addended by: Eustaquio BoydenGUTIERREZ, Lilia Letterman on: 02/20/2015 11:09 PM   Modules accepted: Kipp BroodSmartSet

## 2015-02-20 NOTE — Progress Notes (Signed)
Pre visit review using our clinic review tool, if applicable. No additional management support is needed unless otherwise documented below in the visit note. 

## 2015-02-20 NOTE — Patient Instructions (Addendum)
Your goal blood pressure is <140/90.  Let's add on amlodipine 5mg  daily to blood pressure regimen. Work on low salt/sodium diet - goal <1.5gm (1,500mg ) per day. Eat a diet high in fruits/vegetables and whole grains.  Look into mediterranean and DASH diet. Goal activity is 11050min/wk of moderate intensity exercise.  This can be split into 30 minute chunks.  If you are not at this level, you can start with smaller 10-15 min increments and slowly build up activity. Look at www.heart.org for more resources.  EKG today.

## 2015-02-20 NOTE — Assessment & Plan Note (Addendum)
bp again staying elevated - will add on amlodipine 5mg  daily to bp regimen. Discussed common side effects to monitor for. updat in 3-4 wks if not achieving goal bp <140/90 for increased dose. Baseline EKG today. Pt agrees with plan. EKG - NSR rate 90, mild LAD, normal axis and intervals, no acute ST/T changes.

## 2015-06-23 ENCOUNTER — Ambulatory Visit: Payer: 59 | Admitting: Family Medicine

## 2015-09-25 ENCOUNTER — Other Ambulatory Visit: Payer: Self-pay

## 2015-09-25 MED ORDER — AMLODIPINE BESYLATE 5 MG PO TABS: 5.0000 mg | ORAL_TABLET | Freq: Every day | ORAL | Status: DC

## 2015-09-25 MED ORDER — TRAMADOL HCL 50 MG PO TABS: 50.0000 mg | ORAL_TABLET | Freq: Two times a day (BID) | ORAL | Status: DC | PRN

## 2015-09-25 NOTE — Telephone Encounter (Signed)
plz phone in tramadol. Will refill amlodipine for 77mo then pt will need CPE.

## 2015-09-25 NOTE — Telephone Encounter (Signed)
Pt left v/m requesting refill amlodipine(last refilled # 30 x 6 0n 02/20/15) and tramadol(last refilled # 50 on 12/21/14). Pt last seen 02/20/15 and was to f/u in 3 months; pt cancelled 06/23/15 f/u appt and no future appt scheduled. Is OK to refill med?

## 2015-09-26 NOTE — Telephone Encounter (Signed)
Rx called in as directed.   

## 2015-11-21 ENCOUNTER — Telehealth: Payer: Self-pay | Admitting: Family Medicine

## 2015-11-21 NOTE — Telephone Encounter (Signed)
Pt is wanting to get [facility]PE [facility] Feb [facility] He needs the orders. The best number to contact him is [phone].

## 2015-11-23 NOTE — Telephone Encounter (Signed)
Message left advising patient that orders were upfront for pick up.

## 2015-11-23 NOTE — Telephone Encounter (Signed)
Filled and in Kim's box. 

## 2015-12-15 ENCOUNTER — Other Ambulatory Visit: Payer: Self-pay | Admitting: Family Medicine

## 2015-12-16 LAB — LIPID PANEL W/O CHOL/HDL RATIO
Cholesterol, Total: 211 mg/dL — ABNORMAL HIGH (ref 100–199)
HDL: 45 mg/dL (ref >39)
LDL Calculated: 145 mg/dL — ABNORMAL HIGH (ref 0–99)
TRIGLYCERIDES: 107 mg/dL (ref 0–149)
VLDL Cholesterol Cal: 21 mg/dL (ref 5–40)

## 2015-12-16 LAB — CBC WITH DIFFERENTIAL/PLATELET
BASOS ABS: 0 10*3/uL (ref 0.0–0.2)
BASOS: 1 %
EOS (ABSOLUTE): 0.1 10*3/uL (ref 0.0–0.4)
Eos: 2 %
HEMOGLOBIN: 11.8 g/dL — AB (ref 12.6–17.7)
Hematocrit: 35.5 % — ABNORMAL LOW (ref 37.5–51.0)
IMMATURE GRANS (ABS): 0 10*3/uL (ref 0.0–0.1)
Immature Granulocytes: 0 %
Lymphocytes Absolute: 1.2 10*3/uL (ref 0.7–3.1)
Lymphs: 21 %
MCH: 24.9 pg — AB (ref 26.6–33.0)
MCHC: 33.2 g/dL (ref 31.5–35.7)
MCV: 75 fL — ABNORMAL LOW (ref 79–97)
MONOCYTES: 12 %
Monocytes Absolute: 0.7 10*3/uL (ref 0.1–0.9)
NEUTROS ABS: 3.7 10*3/uL (ref 1.4–7.0)
Neutrophils: 64 %
PLATELETS: 279 10*3/uL (ref 150–379)
RBC: 4.74 x10E6/uL (ref 4.14–5.80)
RDW: 15.4 % (ref 12.3–15.4)
WBC: 5.7 10*3/uL (ref 3.4–10.8)

## 2015-12-16 LAB — RENAL FUNCTION PANEL
Albumin: 4.2 g/dL (ref 3.5–5.5)
BUN/Creatinine Ratio: 12 (ref 9–20)
BUN: 13 mg/dL (ref 6–24)
CALCIUM: 9.5 mg/dL (ref 8.7–10.2)
CHLORIDE: 97 mmol/L (ref 96–106)
CO2: 23 mmol/L (ref 18–29)
Creatinine, Ser: 1.11 mg/dL (ref 0.76–1.27)
GFR calc Af Amer: 89 mL/min/{1.73_m2} (ref >59)
GFR calc non Af Amer: 77 mL/min/{1.73_m2} (ref >59)
GLUCOSE: 96 mg/dL (ref 65–99)
Phosphorus: 4.1 mg/dL (ref 2.5–4.5)
Potassium: 3.8 mmol/L (ref 3.5–5.2)
SODIUM: 138 mmol/L (ref 134–144)

## 2015-12-16 LAB — PSA: Prostate Specific Ag, Serum: 1.4 ng/mL (ref 0.0–4.0)

## 2015-12-16 LAB — FERRITIN: Ferritin: 22 ng/mL — ABNORMAL LOW (ref 30–400)

## 2016-01-03 ENCOUNTER — Ambulatory Visit (INDEPENDENT_AMBULATORY_CARE_PROVIDER_SITE_OTHER): Payer: 59 | Admitting: Family Medicine

## 2016-01-03 ENCOUNTER — Encounter: Payer: Self-pay | Admitting: Family Medicine

## 2016-01-03 VITALS — BP 138/86 | HR 88 | Temp 98.1°F | Wt 227.8 lb

## 2016-01-03 DIAGNOSIS — I10 Essential (primary) hypertension: Secondary | ICD-10-CM

## 2016-01-03 DIAGNOSIS — M25561 Pain in right knee: Secondary | ICD-10-CM

## 2016-01-03 DIAGNOSIS — Z0000 Encounter for general adult medical examination without abnormal findings: Secondary | ICD-10-CM

## 2016-01-03 DIAGNOSIS — E669 Obesity, unspecified: Secondary | ICD-10-CM

## 2016-01-03 DIAGNOSIS — E785 Hyperlipidemia, unspecified: Secondary | ICD-10-CM

## 2016-01-03 DIAGNOSIS — D509 Iron deficiency anemia, unspecified: Secondary | ICD-10-CM

## 2016-01-03 MED ORDER — HYDROCHLOROTHIAZIDE 25 MG PO TABS: ORAL_TABLET | ORAL | Status: DC

## 2016-01-03 MED ORDER — AMLODIPINE BESYLATE 5 MG PO TABS: 5.0000 mg | ORAL_TABLET | Freq: Every day | ORAL | Status: DC

## 2016-01-03 MED ORDER — TRAMADOL HCL 50 MG PO TABS: 50.0000 mg | ORAL_TABLET | Freq: Two times a day (BID) | ORAL | Status: DC | PRN

## 2016-01-03 NOTE — Patient Instructions (Addendum)
Work on low cholesterol diet. Look into mediterranean diet. If cholesterol remaining elevated next year we will discuss cholesterol medicine. Mild anemia with low iron stores - either from low iron or small amout of blood loss (?upper GI tract). Consider return to Dr Niel Hummer to discuss. Start ferrous sulfate  (65FE) iron tablet daily.  Return as needed or in 1 year for next physical.      Mediterranean Diet  Why follow it? Research shows. . Those who follow the Mediterranean diet have a reduced risk of heart disease  . The diet is associated with a reduced incidence of Parkinson's and Alzheimer's diseases . People following the diet may have longer life expectancies and lower rates of chronic diseases  . The Dietary Guidelines for Americans recommends the Mediterranean diet as an eating plan to promote health and prevent disease  What Is the Mediterranean Diet?  . Healthy eating plan based on typical foods and recipes of Mediterranean-style cooking . The diet is primarily a plant based diet; these foods should make up a majority of meals   Starches - Plant based foods should make up a majority of meals - They are an important sources of vitamins, minerals, energy, antioxidants, and fiber - Choose whole grains, foods high in fiber and minimally processed items  - Typical grain sources include wheat, oats, barley, corn, brown rice, bulgar, farro, millet, polenta, couscous  - Various types of beans include chickpeas, lentils, fava beans, black beans, white beans   Fruits  Veggies - Large quantities of antioxidant rich fruits & veggies; 6 or more servings  - Vegetables can be eaten raw or lightly drizzled with oil and cooked  - Vegetables common to the traditional Mediterranean Diet include: artichokes, arugula, beets, broccoli, brussel sprouts, cabbage, carrots, celery, collard greens, cucumbers, eggplant, kale, leeks, lemons, lettuce, mushrooms, okra, onions, peas, peppers, potatoes,  pumpkin, radishes, rutabaga, shallots, spinach, sweet potatoes, turnips, zucchini - Fruits common to the Mediterranean Diet include: apples, apricots, avocados, cherries, clementines, dates, figs, grapefruits, grapes, melons, nectarines, oranges, peaches, pears, pomegranates, strawberries, tangerines  Fats - Replace butter and margarine with healthy oils, such as olive oil, canola oil, and tahini  - Limit nuts to no more than a handful a day  - Nuts include walnuts, almonds, pecans, pistachios, pine nuts  - Limit or avoid candied, honey roasted or heavily salted nuts - Olives are central to the [employer] - can be eaten whole or used in a variety of dishes   Meats Protein - Limiting red meat: no more than a few times a month - When eating red meat: choose lean cuts and keep the portion to the size of deck of cards - Eggs: approx. 0 to 4 times a week  - Fish and lean poultry: at least 2 a week  - Healthy protein sources include, chicken, [place], lean beef, lamb - Increase intake of seafood such as tuna, salmon, trout, mackerel, shrimp, scallops - Avoid or limit high fat processed meats such as sausage and bacon  Dairy - Include moderate amounts of low fat dairy products  - Focus on healthy dairy such as fat free yogurt, skim milk, low or reduced fat cheese - Limit dairy products higher in fat such as whole or 2% milk, cheese, ice cream  Alcohol - Moderate amounts of red wine is ok  - No more than 5 oz daily for women (all ages) and men older than age 55  - No more than 10 oz of wine  daily for men younger than 7  Other - Limit sweets and other desserts  - Use herbs and spices instead of salt to flavor foods  - Herbs and spices common to the traditional Mediterranean Diet include: basil, bay leaves, chives, cloves, cumin, fennel, garlic, lavender, marjoram, mint, oregano, parsley, pepper, rosemary, sage, savory, sumac, tarragon, thyme   It's not just a diet, it's a lifestyle:  . The  Mediterranean diet includes lifestyle factors typical of those in the region  . Foods, drinks and meals are best eaten with others and savored . Daily physical activity is important for overall good health . This could be strenuous exercise like running and aerobics . This could also be more leisurely activities such as walking, housework, yard-work, or taking the stairs . Moderation is the key; a balanced and healthy diet accommodates most foods and drinks . Consider portion sizes and frequency of consumption of certain foods   Meal Ideas & Options:  . Breakfast:  o Whole wheat toast or whole wheat English muffins with peanut butter & hard boiled egg o Steel cut oats topped with apples & cinnamon and skim milk  o Fresh fruit: banana, strawberries, melon, berries, peaches  o Smoothies: strawberries, bananas, greek yogurt, peanut butter o Low fat greek yogurt with blueberries and granola  o Egg white omelet with spinach and mushrooms o Breakfast couscous: whole wheat couscous, apricots, skim milk, cranberries  . Sandwiches:  o Hummus and grilled vegetables (peppers, zucchini, squash) on whole wheat bread   o Grilled chicken on whole wheat pita with lettuce, tomatoes, cucumbers or tzatziki  o Tuna salad on whole wheat bread: tuna salad made with greek yogurt, olives, red peppers, capers, green onions o Garlic rosemary lamb pita: lamb sauted with garlic, rosemary, salt & pepper; add lettuce, cucumber, greek yogurt to pita - flavor with lemon juice and black pepper  . Seafood:  o Mediterranean grilled salmon, seasoned with garlic, basil, parsley, lemon juice and black pepper o Shrimp, lemon, and spinach whole-grain pasta salad made with low fat greek yogurt  o Seared scallops with lemon orzo  o Seared tuna steaks seasoned salt, pepper, coriander topped with tomato mixture of olives, tomatoes, olive oil, minced garlic, parsley, green onions and cappers  . Meats:  o Herbed greek chicken salad  with kalamata olives, cucumber, feta  o Red bell peppers stuffed with spinach, bulgur, lean ground beef (or lentils) & topped with feta   o Kebabs: skewers of chicken, tomatoes, onions, zucchini, squash  o [place] burgers: made with red onions, mint, dill, lemon juice, feta cheese topped with roasted red peppers . Vegetarian o Cucumber salad: cucumbers, artichoke hearts, celery, red onion, feta cheese, tossed in olive oil & lemon juice  o Hummus and whole grain pita points with a greek salad (lettuce, tomato, feta, olives, cucumbers, red onion) o Lentil soup with celery, carrots made with vegetable broth, garlic, salt and pepper  o Tabouli salad: parsley, bulgur, mint, scallions, cucumbers, tomato, radishes, lemon juice, olive oil, salt and pepper.

## 2016-01-03 NOTE — Addendum Note (Signed)
Addended by: Eustaquio Boyden on: 01/03/2016 08:57 AM   Modules accepted: Kipp Brood

## 2016-01-03 NOTE — Assessment & Plan Note (Signed)
Preventative protocols reviewed and updated unless pt declined. Discussed healthy diet and lifestyle.  

## 2016-01-03 NOTE — Assessment & Plan Note (Signed)
Chronic. Not at goal given fmhx (LDL<100). Discussed low chol diet and rec mediterranean diet. Handouts provided. If no improvement next year, discussed would recommend starting statin.

## 2016-01-03 NOTE — Progress Notes (Addendum)
BP 138/86 mmHg  Pulse 88  Temp(Src) 98.1 F (36.7 C) (Oral)  Wt 227 lb 12 oz (103.307 kg)   CC: CPE  Subjective:    Patient ID: [name] [name], male    DOB: May 24, 1966, 50 y.o.   MRN: [id number]  HPI: [name] [name] is a 50 y.o. male presenting on 01/03/2016 for Annual Exam   Iron deficiency anemia - s/p normal colonoscopy, referred back to Dr Arlyce Dice 04/2014 but later cancelled appointment.   Intermittent arthralgias - takes ibuprofen and tramadol intermittently for this. R knee, R ankle, L thigh occasionally. No active synovitis. Denies known trauma/injury.   Preventative: COLONOSCOPY Date: 12/2012 int hem, diverticulosis [employer]) Prostate - desires check yearly  Tetanus shot - 2011  Flu shot - declines, last year vomited after flu shot  Seat belt use discussed  No suspicious moles on skin  Billing specialist - [facility] Lives with wife and son Activity: walks daily 1 hour, walking on breaks at work and on weekends.  Diet: good water, fruits/vegetables daily, fish 1x/wk.   Relevant past medical, surgical, family and social history reviewed and updated as indicated. Interim medical history since our last visit reviewed. Allergies and medications reviewed and updated. Current Outpatient Prescriptions on File Prior to Visit  Medication Sig  . ibuprofen (ADVIL,MOTRIN) 200 MG tablet Take 200 mg by mouth every 6 (six) hours as needed.   No current facility-administered medications on file prior to visit.    Review of Systems  Constitutional: Negative for fever, chills, activity change, appetite change, fatigue and unexpected weight change.  HENT: Negative for hearing loss.   Eyes: Negative for visual disturbance.  Respiratory: Negative for cough, chest tightness, shortness of breath and wheezing.   Cardiovascular: Negative for chest pain, palpitations and leg swelling.  Gastrointestinal: Negative for nausea, vomiting, abdominal pain, diarrhea, constipation, blood in stool and  abdominal distention.  Genitourinary: Negative for hematuria and difficulty urinating.  Musculoskeletal: Negative for myalgias, arthralgias and neck pain.  Skin: Negative for rash.  Neurological: Negative for dizziness, seizures, syncope and headaches.  Hematological: Negative for adenopathy. Does not bruise/bleed easily.  Psychiatric/Behavioral: Negative for dysphoric mood. The patient is not nervous/anxious.    Per HPI unless specifically indicated in ROS section     Objective:    BP 138/86 mmHg  Pulse 88  Temp(Src) 98.1 F (36.7 C) (Oral)  Wt 227 lb 12 oz (103.307 kg)  Wt Readings from Last 3 Encounters:  01/03/16 227 lb 12 oz (103.307 kg)  02/20/15 229 lb 4 oz (103.987 kg)  12/21/14 235 lb 8 oz (106.822 kg)   Body mass index is 33.62 kg/(m^2).  Physical Exam  Constitutional: He is oriented to person, place, and time. He appears well-developed and well-nourished. No distress.  HENT:  Head: Normocephalic and atraumatic.  Right Ear: Hearing, tympanic membrane, external ear and ear canal normal.  Left Ear: Hearing, tympanic membrane, external ear and ear canal normal.  Nose: Nose normal.  Mouth/Throat: Uvula is midline, oropharynx is clear and moist and mucous membranes are normal. No oropharyngeal exudate, posterior oropharyngeal edema or posterior oropharyngeal erythema.  Eyes: Conjunctivae and EOM are normal. Pupils are equal, round, and reactive to light. No scleral icterus.  Neck: Normal range of motion. Neck supple. No thyromegaly present.  Cardiovascular: Normal rate, regular rhythm, normal heart sounds and intact distal pulses.   No murmur heard. Pulses:      Radial pulses are 2+ on the right side, and 2+ on the left side.  Pulmonary/Chest: Effort normal and breath sounds normal. No respiratory distress. He has no wheezes. He has no rales.  Abdominal: Soft. Bowel sounds are normal. He exhibits no distension and no mass. There is no tenderness. There is no rebound and no  guarding.  Genitourinary: Rectum normal and prostate normal. Rectal exam shows no external hemorrhoid, no internal hemorrhoid, no fissure, no mass, no tenderness and anal tone normal. Prostate is not enlarged (20gm) and not tender.  Musculoskeletal: Normal range of motion. He exhibits no edema.  L knee WNL R Knee exam: No deformity on inspection. No pain with palpation of knee landmarks. No effusion/swelling noted. FROM in flex/extension with crepitus on right. No popliteal fullness. Neg drawer test. Neg mcmurray test. No pain with valgus/varus stress. No PFgrind. No abnormal patellar mobility.   Lymphadenopathy:    He has no cervical adenopathy.  Neurological: He is alert and oriented to person, place, and time.  CN grossly intact, station and gait intact  Skin: Skin is warm and dry. No rash noted.  Psychiatric: He has a normal mood and affect. His behavior is normal. Judgment and thought content normal.  Nursing note and vitals reviewed.  Results for orders placed or performed in visit on 12/15/15  CBC with Differential/Platelet  Result Value Ref Range   WBC 5.7 3.4 - 10.8 x10E3/uL   RBC 4.74 4.14 - 5.80 x10E6/uL   Hemoglobin 11.8 (L) 12.6 - 17.7 g/dL   Hematocrit 16.1 (L) 09.6 - 51.0 %   MCV 75 (L) 79 - 97 fL   MCH 24.9 (L) 26.6 - 33.0 pg   MCHC 33.2 31.5 - 35.7 g/dL   RDW 04.5 40.9 - 81.1 %   Platelets 279 150 - 379 x10E3/uL   Neutrophils 64 %   Lymphs 21 %   Monocytes 12 %   Eos 2 %   Basos 1 %   Neutrophils Absolute 3.7 1.4 - 7.0 x10E3/uL   Lymphocytes Absolute 1.2 0.7 - 3.1 x10E3/uL   Monocytes Absolute 0.7 0.1 - 0.9 x10E3/uL   EOS (ABSOLUTE) 0.1 0.0 - 0.4 x10E3/uL   Basophils Absolute 0.0 0.0 - 0.2 x10E3/uL   Immature Granulocytes 0 %   Immature Grans (Abs) 0.0 0.0 - 0.1 x10E3/uL  Renal function panel  Result Value Ref Range   Glucose 96 65 - 99 mg/dL   BUN 13 6 - 24 mg/dL   Creatinine, Ser 9.14 0.76 - 1.27 mg/dL   GFR calc non Af Amer 77 >59 mL/min/1.73    GFR calc Af Amer 89 >59 mL/min/1.73   BUN/Creatinine Ratio 12 9 - 20   Sodium 138 134 - 144 mmol/L   Potassium 3.8 3.5 - 5.2 mmol/L   Chloride 97 96 - 106 mmol/L   CO2 23 18 - 29 mmol/L   Calcium 9.5 8.7 - 10.2 mg/dL   Phosphorus 4.1 2.5 - 4.5 mg/dL   Albumin 4.2 3.5 - 5.5 g/dL  Lipid Panel w/o Chol/HDL Ratio  Result Value Ref Range   Cholesterol, Total 211 (H) 100 - 199 mg/dL   Triglycerides 782 0 - 149 mg/dL   HDL 45 >95 mg/dL   VLDL Cholesterol Cal 21 5 - 40 mg/dL   LDL Calculated 621 (H) 0 - 99 mg/dL  PSA  Result Value Ref Range   Prostate Specific Ag, Serum 1.4 0.0 - 4.0 ng/mL  Ferritin  Result Value Ref Range   Ferritin 22 (L) 30 - 400 ng/mL      Assessment & Plan:  Problem List Items Addressed This Visit    Right knee pain    Intermittent discomfort anticipate osteoarthritis component. No pain today on exam. Declined to fill handicap placard today. Pt states he does go up 3 flights of stairs daily at work.      Obesity, Class I, BMI 30-34.9    Discussed healthy diet and lifestyles changes to affect sustainable weight loss      Iron deficiency anemia    Persistent. Had normal colonoscopy 2014 (int hem, diverticulosis). I sent him back to GI after this to discuss persistent IDA, pt cancelled appt. Discussed persistent IDA, rec return to GI and rec start ferrous sulfate  daily. Advised take on empty stomach with OJ if tolerated otherwise with meal.      Relevant Medications   ferrous sulfate 325 (65 FE) MG tablet   HTN (hypertension)    Chronic, stable. Continue current regimen.      Relevant Medications   amLODipine (NORVASC) 5 MG tablet   hydrochlorothiazide (HYDRODIURIL) 25 MG tablet   HLD (hyperlipidemia)    Chronic. Not at goal given fmhx (LDL<100). Discussed low chol diet and rec mediterranean diet. Handouts provided. If no improvement next year, discussed would recommend starting statin.      Relevant Medications   amLODipine (NORVASC) 5 MG tablet    hydrochlorothiazide (HYDRODIURIL) 25 MG tablet   Healthcare maintenance - Primary    Preventative protocols reviewed and updated unless pt declined. Discussed healthy diet and lifestyle.           Follow up plan: No Follow-up on file.

## 2016-01-03 NOTE — Assessment & Plan Note (Signed)
Intermittent discomfort anticipate osteoarthritis component. No pain today on exam. Declined to fill handicap placard today. Pt states he does go up 3 flights of stairs daily at work.

## 2016-01-03 NOTE — Assessment & Plan Note (Addendum)
Persistent. Had normal colonoscopy 2014 (int hem, diverticulosis). I sent him back to GI after this to discuss persistent IDA, pt cancelled appt. Discussed persistent IDA, rec return to GI and rec start ferrous sulfate  daily. Advised take on empty stomach with OJ if tolerated otherwise with meal.

## 2016-01-03 NOTE — Progress Notes (Signed)
Pre visit review using our clinic review tool, if applicable. No additional management support is needed unless otherwise documented below in the visit note. 

## 2016-01-03 NOTE — Assessment & Plan Note (Signed)
Chronic, stable. Continue current regimen. 

## 2016-01-03 NOTE — Assessment & Plan Note (Signed)
Discussed healthy diet and lifestyles changes to affect sustainable weight loss

## 2016-04-16 ENCOUNTER — Other Ambulatory Visit: Payer: Self-pay

## 2016-04-16 MED ORDER — TRAMADOL HCL 50 MG PO TABS: 50.0000 mg | ORAL_TABLET | Freq: Two times a day (BID) | ORAL | Status: DC | PRN

## 2016-04-16 NOTE — Telephone Encounter (Signed)
plz phone in. 

## 2016-04-16 NOTE — Telephone Encounter (Signed)
Pt left v/m requesting refill tramadol to [facility] [address]. Pt last seen and rx last refilled # 50 on 01/03/16.Please advise.

## 2016-04-17 NOTE — Telephone Encounter (Signed)
Rx called in as directed.   

## 2016-12-09 ENCOUNTER — Encounter: Payer: Self-pay | Admitting: Family Medicine

## 2016-12-09 ENCOUNTER — Ambulatory Visit (INDEPENDENT_AMBULATORY_CARE_PROVIDER_SITE_OTHER): Payer: 59 | Admitting: Family Medicine

## 2016-12-09 ENCOUNTER — Ambulatory Visit (INDEPENDENT_AMBULATORY_CARE_PROVIDER_SITE_OTHER)
Admission: RE | Admit: 2016-12-09 | Discharge: 2016-12-09 | Disposition: A | Discharge status: Home / Self Care | Payer: 59 | Source: Ambulatory Visit | Attending: Family Medicine | Admitting: Family Medicine

## 2016-12-09 VITALS — BP 130/82 | HR 80 | Temp 98.2°F | Ht 69.0 in | Wt 203.5 lb

## 2016-12-09 DIAGNOSIS — D509 Iron deficiency anemia, unspecified: Secondary | ICD-10-CM | POA: Diagnosis not present

## 2016-12-09 DIAGNOSIS — I10 Essential (primary) hypertension: Secondary | ICD-10-CM

## 2016-12-09 DIAGNOSIS — Z0001 Encounter for general adult medical examination with abnormal findings: Secondary | ICD-10-CM | POA: Diagnosis not present

## 2016-12-09 DIAGNOSIS — M25549 Pain in joints of unspecified hand: Secondary | ICD-10-CM

## 2016-12-09 DIAGNOSIS — E669 Obesity, unspecified: Secondary | ICD-10-CM | POA: Diagnosis not present

## 2016-12-09 DIAGNOSIS — M2550 Pain in unspecified joint: Secondary | ICD-10-CM | POA: Insufficient documentation

## 2016-12-09 DIAGNOSIS — R634 Abnormal weight loss: Secondary | ICD-10-CM

## 2016-12-09 DIAGNOSIS — M545 Low back pain, unspecified: Secondary | ICD-10-CM | POA: Insufficient documentation

## 2016-12-09 DIAGNOSIS — E785 Hyperlipidemia, unspecified: Secondary | ICD-10-CM

## 2016-12-09 DIAGNOSIS — M25561 Pain in right knee: Secondary | ICD-10-CM

## 2016-12-09 DIAGNOSIS — G8929 Other chronic pain: Secondary | ICD-10-CM | POA: Insufficient documentation

## 2016-12-09 LAB — POC URINALSYSI DIPSTICK (AUTOMATED)
Bilirubin, UA: NEGATIVE
GLUCOSE UA: NEGATIVE
Ketones, UA: NEGATIVE
Leukocytes, UA: NEGATIVE
Nitrite, UA: NEGATIVE
Protein, UA: NEGATIVE
RBC UA: NEGATIVE
Spec Grav, UA: >=1.03
UROBILINOGEN UA: 0.2
pH, UA: 6

## 2016-12-09 NOTE — Assessment & Plan Note (Signed)
New. Not intentionally trying but overall healthy lifestyle changes endorsed.  Check CXR, UA, baseline labs as above.

## 2016-12-09 NOTE — Progress Notes (Addendum)
BP 130/82   Pulse 80   Temp 98.2 F (36.8 C) (Oral)   Ht 5' 9"  (1.753 m)   Wt 203 lb 8 oz (92.3 kg)   BMI 30.05 kg/m    CC: CPE Subjective:    Patient ID: [name] [name], male    DOB: [birth year], 51 y.o.   MRN: [id number]  HPI: [name] [name] is a 51 y.o. male presenting on 12/09/2016 for Annual Exam   25 lb weight loss - eating healthier but not intentionally trying to lose weight. Stressful year - with recent move.   Some stiffening of joints and ongoing R knee discomfort worse with climbing stairs. Tramadol and ibuprofen help, but notes some itching with tramadol.   Iron deficiency anemia - s/p normal colonoscopy, referred back to Dr Deatra Ina 04/2014 but later cancelled appointment.   Takes 816m ibuprofen daily. Not much caffeine, spicy foods.  Preventative: COLONOSCOPY Date: 12/2012 int hem, diverticulosis ([facility]ft[facility]har)  Prostate - desires check yearly  Flu shot - declines Tetanus shot - 2011  Seat belt use discussed Sunscreen use discussed. 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 Interim medical history since our last visit reviewed. Allergies and medications reviewed and updated. Current Outpatient Prescriptions on File Prior to Visit  Medication Sig  . amLODipine (NORVASC) 5 MG tablet Take 1 tablet (5 mg total) by mouth daily.  . ferrous sulfate 325 (65 FE) MG tablet Take 325 mg by mouth daily with breakfast.  . hydrochlorothiazide (HYDRODIURIL) 25 MG tablet TAKE ONE TABLET BY MOUTH EVERY DAY  . ibuprofen (ADVIL,MOTRIN) 200 MG tablet Take 200 mg by mouth every 6 (six) hours as needed.  . traMADol (ULTRAM) 50 MG tablet Take 1 tablet (50 mg total) by mouth every 12 (twelve) hours as  needed for moderate pain.   No current facility-administered medications on file prior to visit.     Review of Systems  Constitutional: Positive for unexpected weight change (loss). Negative for activity change, appetite change, chills, fatigue and fever.  HENT: Negative for hearing loss.   Eyes: Negative for visual disturbance.  Respiratory: Negative for cough, chest tightness, shortness of breath and wheezing.   Cardiovascular: Negative for chest pain, palpitations and leg swelling.  Gastrointestinal: Negative for abdominal distention, abdominal pain, blood in stool, constipation, diarrhea, nausea and vomiting.       No dysphagia, early satiety   Endocrine: Positive for cold intolerance. Negative for heat intolerance.  Genitourinary: Negative for difficulty urinating and hematuria.  Musculoskeletal: Positive for arthralgias. Negative for myalgias and neck pain.  Skin: Negative for rash.  Neurological: Negative for dizziness, seizures, syncope and headaches.  Hematological: Negative for adenopathy. Does not bruise/bleed easily.  Psychiatric/Behavioral: Negative for dysphoric mood. The patient is not nervous/anxious.    Per HPI unless specifically indicated in ROS section     Objective:    BP 130/82   Pulse 80   Temp 98.2 F (36.8 C) (Oral)   Ht 5' 9"  (1.753 m)   Wt 203 lb 8 oz (92.3 kg)   BMI 30.05 kg/m   Wt Readings from Last 3 Encounters:  12/09/16 203 lb 8 oz (92.3 kg)  01/03/16 227 lb 12 oz (103.3 kg)  02/20/15 229 lb  4 oz (104 kg)    Physical Exam  Constitutional: He is oriented to person, place, and time. He appears well-developed and well-nourished. No distress.  HENT:  Head: Normocephalic and atraumatic.  Right Ear: Hearing, tympanic membrane, external ear and ear canal normal.  Left Ear: Hearing, tympanic membrane, external ear and ear canal normal.  Nose: Nose normal.  Mouth/Throat: Uvula is midline, oropharynx is clear and moist and mucous membranes are  normal. No oropharyngeal exudate, posterior oropharyngeal edema or posterior oropharyngeal erythema.  Eyes: Conjunctivae and EOM are normal. Pupils are equal, round, and reactive to light. No scleral icterus.  Neck: Normal range of motion. Neck supple. No thyromegaly present.  Cardiovascular: Normal rate, regular rhythm, normal heart sounds and intact distal pulses.   No murmur heard. Pulses:      Radial pulses are 2+ on the right side, and 2+ on the left side.  Pulmonary/Chest: Effort normal and breath sounds normal. No respiratory distress. He has no wheezes. He has no rales.  Abdominal: Soft. Bowel sounds are normal. He exhibits no distension and no mass. There is no tenderness. There is no rebound and no guarding.  Genitourinary: Rectum normal and prostate normal. Rectal exam shows no external hemorrhoid, no fissure, no mass, no tenderness and anal tone normal. Prostate is not enlarged (20gm) and not tender.  Musculoskeletal: Normal range of motion. He exhibits no edema.  No active synovitis  Lymphadenopathy:    He has no cervical adenopathy.  Neurological: He is alert and oriented to person, place, and time.  CN grossly intact, station and gait intact  Skin: Skin is warm and dry. No rash noted.  Psychiatric: He has a normal mood and affect. His behavior is normal. Judgment and thought content normal.  Nursing note and vitals reviewed.  Results for orders placed or performed in visit on 12/15/15  CBC with Differential/Platelet  Result Value Ref Range   WBC 5.7 3.4 - 10.8 x10E3/uL   RBC 4.74 4.14 - 5.80 x10E6/uL   Hemoglobin 11.8 (L) 12.6 - 17.7 g/dL   Hematocrit 35.5 (L) 37.5 - 51.0 %   MCV 75 (L) 79 - 97 fL   MCH 24.9 (L) 26.6 - 33.0 pg   MCHC 33.2 31.5 - 35.7 g/dL   RDW 15.4 12.3 - 15.4 %   Platelets 279 150 - 379 x10E3/uL   Neutrophils 64 %   Lymphs 21 %   Monocytes 12 %   Eos 2 %   Basos 1 %   Neutrophils Absolute 3.7 1.4 - 7.0 x10E3/uL   Lymphocytes Absolute 1.2 0.7 -  3.1 x10E3/uL   Monocytes Absolute 0.7 0.1 - 0.9 x10E3/uL   EOS (ABSOLUTE) 0.1 0.0 - 0.4 x10E3/uL   Basophils Absolute 0.0 0.0 - 0.2 x10E3/uL   Immature Granulocytes 0 %   Immature Grans (Abs) 0.0 0.0 - 0.1 x10E3/uL  Renal function panel  Result Value Ref Range   Glucose 96 65 - 99 mg/dL   BUN 13 6 - 24 mg/dL   Creatinine, Ser 1.11 0.76 - 1.27 mg/dL   GFR calc non Af Amer 77 >59 mL/min/1.73   GFR calc Af Amer 89 >59 mL/min/1.73   BUN/Creatinine Ratio 12 9 - 20   Sodium 138 134 - 144 mmol/L   Potassium 3.8 3.5 - 5.2 mmol/L   Chloride 97 96 - 106 mmol/L   CO2 23 18 - 29 mmol/L   Calcium 9.5 8.7 - 10.2 mg/dL   Phosphorus 4.1 2.5 - 4.5 mg/dL  Albumin 4.2 3.5 - 5.5 g/dL  Lipid Panel w/o Chol/HDL Ratio  Result Value Ref Range   Cholesterol, Total 211 (H) 100 - 199 mg/dL   Triglycerides 107 0 - 149 mg/dL   HDL 45 >39 mg/dL   VLDL Cholesterol Cal 21 5 - 40 mg/dL   LDL Calculated 145 (H) 0 - 99 mg/dL  PSA  Result Value Ref Range   Prostate Specific Ag, Serum 1.4 0.0 - 4.0 ng/mL  Ferritin  Result Value Ref Range   Ferritin 22 (L) 30 - 400 ng/mL  No results found for: TSH     Assessment & Plan:   Problem List Items Addressed This Visit    Arthralgia    Of hands and R knee. Anticipate OA. Suggested glucosamine daily x 1 mo then update with effect. Tramadol causing itching - consider tylenol #3.  Discussed caution with NSAID use and IDA.      Encounter for well adult exam with abnormal findings - Primary    Preventative protocols reviewed and updated unless pt declined. Discussed healthy diet and lifestyle.       HLD (hyperlipidemia)    Chronic, stable off meds. Update FLP.       HTN (hypertension)    Chronic, stable. Continue current regimen.       Iron deficiency anemia    Normal colonoscopy 2014. Did not return to GI despite my request to do so. Reports compliance with ferrous sulfate daily.  Update labs (CBC, iron panel, ferritin, ESR).  Discussed if persistent IDA  - will need GI referral to eval for upper GI sources of bleeding.       Loss of weight    New. Not intentionally trying but overall healthy lifestyle changes endorsed.  Check CXR, UA, baseline labs as above.       Relevant Orders   DG Chest 2 View   Obesity, Class I, BMI 30-34.9    25 lb weight loss noted, not significantly trying - see below.           Follow up plan: Return in about 1 year (around 12/09/2017) for annual exam, prior fasting for blood work.  Ria Bush, MD

## 2016-12-09 NOTE — Progress Notes (Signed)
Pre visit review using our clinic review tool, if applicable. No additional management support is needed unless otherwise documented below in the visit note. 

## 2016-12-09 NOTE — Assessment & Plan Note (Signed)
Preventative protocols reviewed and updated unless pt declined. Discussed healthy diet and lifestyle.  

## 2016-12-09 NOTE — Assessment & Plan Note (Signed)
Normal colonoscopy 2014. Did not return to GI despite my request to do so. Reports compliance with ferrous sulfate daily.  Update labs (CBC, iron panel, ferritin, ESR).  Discussed if persistent IDA - will need GI referral to eval for upper GI sources of bleeding.

## 2016-12-09 NOTE — Addendum Note (Signed)
Addended by: Josph MachoANCE, KIMBERLY A on: 12/09/2016 11:10 AM   Modules accepted: Orders

## 2016-12-09 NOTE — Assessment & Plan Note (Signed)
25 lb weight loss noted, not significantly trying - see below.

## 2016-12-09 NOTE — Assessment & Plan Note (Signed)
Chronic, stable. Continue current regimen. 

## 2016-12-09 NOTE — Assessment & Plan Note (Signed)
Of hands and R knee. Anticipate OA. Suggested glucosamine daily x 1 mo then update with effect. Tramadol causing itching - consider tylenol #3.  Discussed caution with NSAID use and IDA.

## 2016-12-09 NOTE — Assessment & Plan Note (Signed)
Chronic, stable off meds. Update FLP.

## 2016-12-09 NOTE — Patient Instructions (Addendum)
Fasting labs at [facility] - script provided today. We will be in touch with lab results. Xray and urine check today.  Trial glucosamine supplement daily for joint health - update me with effect after a month.   Health Maintenance, Male A healthy lifestyle and preventative care can promote health and wellness.  Maintain regular health, dental, and eye exams.  Eat a healthy diet. Foods like vegetables, fruits, whole grains, low-fat dairy products, and lean protein foods contain the nutrients you need and are low in calories. Decrease your intake of foods high in solid fats, added sugars, and salt. Get information about a proper diet from your health care provider, if necessary.  Regular physical exercise is one of the most important things you can do for your health. Most adults should get at least 150 minutes of moderate-intensity exercise (any activity that increases your heart rate and causes you to sweat) each week. In addition, most adults need muscle-strengthening exercises on 2 or more days a week.   Maintain a healthy weight. The body mass index (BMI) is a screening tool to identify possible weight problems. It provides an estimate of body fat based on height and weight. Your health care provider can find your BMI and can help you achieve or maintain a healthy weight. For males 20 years and older:  A BMI below 18.5 is considered underweight.  A BMI of 18.5 to 24.9 is normal.  A BMI of 25 to 29.9 is considered overweight.  A BMI of 30 and above is considered obese.  Maintain normal blood lipids and cholesterol by exercising and minimizing your intake of saturated fat. Eat a balanced diet with plenty of fruits and vegetables. Blood tests for lipids and cholesterol should begin at age 73 and be repeated every 5 years. If your lipid or cholesterol levels are high, you are over age 27, or you are at high risk for heart disease, you may need your cholesterol levels checked more  frequently.Ongoing high lipid and cholesterol levels should be treated with medicines if diet and exercise are not working.  If you smoke, find out from your health care provider how to quit. If you do not use tobacco, do not start.  Lung cancer screening is recommended for adults aged 55-80 years who are at high risk for developing lung cancer because of a history of smoking. A yearly low-dose CT scan of the lungs is recommended for people who have at least a 30-pack-year history of smoking and are current smokers or have quit within the past 15 years. A pack year of smoking is smoking an average of 1 pack of cigarettes a day for 1 year (for example, a 30-pack-year history of smoking could mean smoking 1 pack a day for 30 years or 2 packs a day for 15 years). Yearly screening should continue until the smoker has stopped smoking for at least 15 years. Yearly screening should be stopped for people who develop a health problem that would prevent them from having lung cancer treatment.  If you choose to drink alcohol, do not have more than 2 drinks per day. One drink is considered to be 12 oz (360 mL) of beer, 5 oz (150 mL) of wine, or 1.5 oz (45 mL) of liquor.  Avoid the use of street drugs. Do not share needles with anyone. Ask for help if you need support or instructions about stopping the use of drugs.  High blood pressure causes heart disease and increases the risk of stroke.  High blood pressure is more likely to develop in:  People who have blood pressure in the end of the normal range (100-139/85-89 mm Hg).  People who are overweight or obese.  People who are African American.  If you are 7418-52 years of age, have your blood pressure checked every 3-5 years. If you are 52 years of age or older, have your blood pressure checked every year. You should have your blood pressure measured twice-once when you are at a hospital or clinic, and once when you are not at a hospital or clinic. Record the  average of the two measurements. To check your blood pressure when you are not at a hospital or clinic, you can use:  An automated blood pressure machine at a pharmacy.  A home blood pressure monitor.  If you are 3045-52 years old, ask your health care provider if you should take aspirin to prevent heart disease.  Diabetes screening involves taking a blood sample to check your fasting blood sugar level. This should be done once every 3 years after age 52 if you are at a normal weight and without risk factors for diabetes. Testing should be considered at a younger age or be carried out more frequently if you are overweight and have at least 1 risk factor for diabetes.  Colorectal cancer can be detected and often prevented. Most routine colorectal cancer screening begins at the age of 52 and continues through age 52. However, your health care provider may recommend screening at an earlier age if you have risk factors for colon cancer. On a yearly basis, your health care provider may provide home test kits to check for hidden blood in the stool. A small camera at the end of a tube may be used to directly examine the colon (sigmoidoscopy or colonoscopy) to detect the earliest forms of colorectal cancer. Talk to your health care provider about this at age 52 when routine screening begins. A direct exam of the colon should be repeated every 5-10 years through age 52, unless early forms of precancerous polyps or small growths are found.  People who are at an increased risk for hepatitis B should be screened for this virus. You are considered at high risk for hepatitis B if:  You were born in a country where hepatitis B occurs often. Talk with your health care provider about which countries are considered high risk.  Your parents were born in a high-risk country and you have not received a shot to protect against hepatitis B (hepatitis B vaccine).  You have HIV or AIDS.  You use needles to inject street  drugs.  You live with, or have sex with, someone who has hepatitis B.  You are a man who has sex with other men (MSM).  You get hemodialysis treatment.  You take certain medicines for conditions like cancer, organ transplantation, and autoimmune conditions.  Hepatitis C blood testing is recommended for all people born from [id number] through 1966 and any individual with known risk factors for hepatitis C.  Healthy men should no longer receive prostate-specific antigen (PSA) blood tests as part of routine cancer screening. Talk to your health care provider about prostate cancer screening.  Testicular cancer screening is not recommended for adolescents or adult males who have no symptoms. Screening includes self-exam, a health care provider exam, and other screening tests. Consult with your health care provider about any symptoms you have or any concerns you have about testicular cancer.  Practice safe sex. Use condoms  and avoid high-risk sexual practices to reduce the spread of sexually transmitted infections (STIs).  You should be screened for STIs, including gonorrhea and chlamydia if:  You are sexually active and are younger than 24 years.  You are older than 24 years, and your health care provider tells you that you are at risk for this type of infection.  Your sexual activity has changed since you were last screened, and you are at an increased risk for chlamydia or gonorrhea. Ask your health care provider if you are at risk.  If you are at risk of being infected with HIV, it is recommended that you take a prescription medicine daily to prevent HIV infection. This is called pre-exposure prophylaxis (PrEP). You are considered at risk if:  You are a man who has sex with other men (MSM).  You are a heterosexual man who is sexually active with multiple partners.  You take drugs by injection.  You are sexually active with a partner who has HIV.  Talk with your health care provider about  whether you are at high risk of being infected with HIV. If you choose to begin PrEP, you should first be tested for HIV. You should then be tested every 3 months for as long as you are taking PrEP.  Use sunscreen. Apply sunscreen liberally and repeatedly throughout the day. You should seek shade when your shadow is shorter than you. Protect yourself by wearing long sleeves, pants, a wide-brimmed hat, and sunglasses year round whenever you are outdoors.  Tell your health care provider of new moles or changes in moles, especially if there is a change in shape or color. Also, tell your health care provider if a mole is larger than the size of a pencil eraser.  A one-time screening for abdominal aortic aneurysm (AAA) and surgical repair of large AAAs by ultrasound is recommended for men aged 65-75 years who are current or former smokers.  Stay current with your vaccines (immunizations). This information is not intended to replace advice given to you by your health care provider. Make sure you discuss any questions you have with your health care provider. Document Released: 05/02/2008 Document Revised: 11/25/2014 Document Reviewed: 08/08/2015 Elsevier Interactive Patient Education  2017 [employer].

## 2016-12-09 NOTE — Addendum Note (Signed)
Addended by: Eustaquio BoydenGUTIERREZ, Jasman Pfeifle on: 12/09/2016 10:33 AM   Modules accepted: Orders

## 2016-12-13 ENCOUNTER — Other Ambulatory Visit: Payer: Self-pay | Admitting: Family Medicine

## 2016-12-14 LAB — CBC WITH DIFFERENTIAL/PLATELET
BASOS ABS: 0 10*3/uL (ref 0.0–0.2)
Basos: 0 %
EOS (ABSOLUTE): 0.1 10*3/uL (ref 0.0–0.4)
Eos: 2 %
HEMATOCRIT: 36.3 % — AB (ref 37.5–51.0)
Hemoglobin: 11.7 g/dL — ABNORMAL LOW (ref 13.0–17.7)
Immature Grans (Abs): 0 10*3/uL (ref 0.0–0.1)
Immature Granulocytes: 0 %
LYMPHS ABS: 1.1 10*3/uL (ref 0.7–3.1)
Lymphs: 23 %
MCH: 25.7 pg — ABNORMAL LOW (ref 26.6–33.0)
MCHC: 32.2 g/dL (ref 31.5–35.7)
MCV: 80 fL (ref 79–97)
MONOS ABS: 0.7 10*3/uL (ref 0.1–0.9)
Monocytes: 14 %
Neutrophils Absolute: 2.8 10*3/uL (ref 1.4–7.0)
Neutrophils: 61 %
Platelets: 252 10*3/uL (ref 150–379)
RBC: 4.56 x10E6/uL (ref 4.14–5.80)
RDW: 15.4 % (ref 12.3–15.4)
WBC: 4.7 10*3/uL (ref 3.4–10.8)

## 2016-12-14 LAB — COMPREHENSIVE METABOLIC PANEL
A/G RATIO: 1.8 (ref 1.2–2.2)
ALK PHOS: 37 IU/L — AB (ref 39–117)
ALT: 9 IU/L (ref 0–44)
AST: 12 IU/L (ref 0–40)
Albumin: 4.2 g/dL (ref 3.5–5.5)
BILIRUBIN TOTAL: 1.3 mg/dL — AB (ref 0.0–1.2)
BUN/Creatinine Ratio: 16 (ref 9–20)
BUN: 17 mg/dL (ref 6–24)
CO2: 25 mmol/L (ref 18–29)
Calcium: 9.6 mg/dL (ref 8.7–10.2)
Chloride: 99 mmol/L (ref 96–106)
Creatinine, Ser: 1.07 mg/dL (ref 0.76–1.27)
GFR calc Af Amer: 92 mL/min/{1.73_m2} (ref >59)
GFR calc non Af Amer: 80 mL/min/{1.73_m2} (ref >59)
GLUCOSE: 88 mg/dL (ref 65–99)
Globulin, Total: 2.4 g/dL (ref 1.5–4.5)
POTASSIUM: 4.2 mmol/L (ref 3.5–5.2)
Sodium: 139 mmol/L (ref 134–144)
Total Protein: 6.6 g/dL (ref 6.0–8.5)

## 2016-12-14 LAB — SEDIMENTATION RATE: SED RATE: 4 mm/h (ref 0–30)

## 2016-12-14 LAB — LIPID PANEL W/O CHOL/HDL RATIO
CHOLESTEROL TOTAL: 215 mg/dL — AB (ref 100–199)
HDL: 50 mg/dL (ref >39)
LDL Calculated: 146 mg/dL — ABNORMAL HIGH (ref 0–99)
Triglycerides: 93 mg/dL (ref 0–149)
VLDL CHOLESTEROL CAL: 19 mg/dL (ref 5–40)

## 2016-12-14 LAB — IRON AND TIBC
IRON: 113 ug/dL (ref 38–169)
Iron Saturation: 37 % (ref 15–55)
Total Iron Binding Capacity: 309 ug/dL (ref 250–450)
UIBC: 196 ug/dL (ref 111–343)

## 2016-12-14 LAB — FERRITIN: FERRITIN: 24 ng/mL — AB (ref 30–400)

## 2016-12-14 LAB — TSH: TSH: 1.24 u[IU]/mL (ref 0.450–4.500)

## 2016-12-18 ENCOUNTER — Other Ambulatory Visit: Payer: Self-pay | Admitting: Family Medicine

## 2016-12-18 DIAGNOSIS — D509 Iron deficiency anemia, unspecified: Secondary | ICD-10-CM

## 2016-12-18 DIAGNOSIS — R634 Abnormal weight loss: Secondary | ICD-10-CM

## 2016-12-19 ENCOUNTER — Encounter: Payer: Self-pay | Admitting: Physician Assistant

## 2016-12-28 ENCOUNTER — Other Ambulatory Visit: Payer: Self-pay | Admitting: Family Medicine

## 2016-12-30 ENCOUNTER — Ambulatory Visit: Payer: 59 | Admitting: Nurse Practitioner

## 2016-12-30 MED ORDER — HYDROCHLOROTHIAZIDE 25 MG PO TABS: ORAL_TABLET | ORAL | 3 refills | Status: DC

## 2016-12-30 MED ORDER — TRAMADOL HCL 50 MG PO TABS: 50.0000 mg | ORAL_TABLET | Freq: Two times a day (BID) | ORAL | 0 refills | Status: DC | PRN

## 2016-12-30 MED ORDER — AMLODIPINE BESYLATE 5 MG PO TABS: 5.0000 mg | ORAL_TABLET | Freq: Every day | ORAL | 3 refills | Status: DC

## 2016-12-30 NOTE — Telephone Encounter (Signed)
plz phone in. 

## 2016-12-30 NOTE — Telephone Encounter (Signed)
Rx called in as directed.   

## 2017-01-09 ENCOUNTER — Ambulatory Visit: Payer: 59 | Admitting: Physician Assistant

## 2017-02-25 ENCOUNTER — Other Ambulatory Visit: Payer: Self-pay | Admitting: Family Medicine

## 2017-02-27 MED ORDER — TRAMADOL HCL 50 MG PO TABS: 50.0000 mg | ORAL_TABLET | Freq: Two times a day (BID) | ORAL | 0 refills | Status: DC | PRN

## 2017-02-27 NOTE — Telephone Encounter (Signed)
Left refill on voice mail at pharmacy  

## 2017-02-27 NOTE — Telephone Encounter (Signed)
plz phone in. 

## 2017-04-17 ENCOUNTER — Other Ambulatory Visit: Payer: Self-pay | Admitting: Family Medicine

## 2017-04-18 ENCOUNTER — Other Ambulatory Visit: Payer: Self-pay | Admitting: Family Medicine

## 2017-04-18 MED ORDER — TRAMADOL HCL 50 MG PO TABS: 50.0000 mg | ORAL_TABLET | Freq: Two times a day (BID) | ORAL | 0 refills | Status: DC | PRN

## 2017-04-18 NOTE — Telephone Encounter (Signed)
Rx called in to requested pharmacy 

## 2017-04-18 NOTE — Telephone Encounter (Signed)
Last Rx 02/27/2017. Last OV 11/2016

## 2017-04-18 NOTE — Telephone Encounter (Signed)
plz phone in. 

## 2017-04-21 ENCOUNTER — Encounter: Payer: Self-pay | Admitting: Gastroenterology

## 2017-05-18 HISTORY — PX: COLONOSCOPY: SHX174

## 2017-05-28 ENCOUNTER — Ambulatory Visit (INDEPENDENT_AMBULATORY_CARE_PROVIDER_SITE_OTHER): Payer: 59 | Admitting: Gastroenterology

## 2017-05-28 ENCOUNTER — Encounter: Payer: Self-pay | Admitting: Gastroenterology

## 2017-05-28 VITALS — BP 114/72 | HR 90 | Ht 67.0 in | Wt 206.0 lb

## 2017-05-28 DIAGNOSIS — D509 Iron deficiency anemia, unspecified: Secondary | ICD-10-CM | POA: Diagnosis not present

## 2017-05-28 DIAGNOSIS — K649 Unspecified hemorrhoids: Secondary | ICD-10-CM

## 2017-05-28 MED ORDER — NA SULFATE-K SULFATE-MG SULF 17.5-3.13-1.6 GM/177ML PO SOLN: 1.0000 | Freq: Once | ORAL | 0 refills | Status: AC

## 2017-05-28 NOTE — Patient Instructions (Signed)
If you are age 765 or older, your body mass index should be between 23-30. Your Body mass index is 32.26 kg/m. If this is out of the aforementioned range listed, please consider follow up with your Primary Care Provider.  If you are age 52 or younger, your body mass index should be between 19-25. Your Body mass index is 32.26 kg/m. If this is out of the aformentioned range listed, please consider follow up with your Primary Care Provider.   We have sent the following medications to your pharmacy for you to pick up at your convenience:  Suprep  You have been scheduled for an endoscopy and colonoscopy. Please follow the written instructions given to you at your visit today. Please pick up your prep supplies at the pharmacy within the next 1-3 days. If you use inhalers (even only as needed), please bring them with you on the day of your procedure. Your physician has requested that you go to www.startemmi.com and enter the access code given to you at your visit today. This web site gives a general overview about your procedure. However, you should still follow specific instructions given to you by our office regarding your preparation for the procedure.  Please purchase Miralax over the counter and use twice daily. You may titrate as needed.  Please purchase over the counter glycerin suppository with 1% hydrocortisone cream and use daily as needed for hemorrhoids.  Thank you.

## 2017-05-28 NOTE — Progress Notes (Signed)
HPI :  51 y/o male with a history of iron deficiency anemia, HTN, HLD, here for a consultation from Dr. Ria Bush for an iron deficiency anemia, rectal bleeding  Labs in January 2018 - Hgb of 11.7, MCV 80, ferritin of 24 but iron level itself normal while taking oral iron.  Negative hemeoccult in 2013 Hgb of 12.2 noted back in old labs in 2013   He thinks anemic since 2013. He has been oral iron a few years. Colonoscopy in 2014 by by a Dr. Lia Hopping at [facility] showed hemorrhoids and diverticulosis.   He reports he has had some blood in the stools and in the toilet. He thinks this has been ongoing for the past month, notes it a few times per week, which is new for him. He does endorse straining and constipation, and notes some perianal pain with bowel movements. No abdominal pains. He thinks he has hemorrhoids.   No known FH of colon cancer. No dysphagia, eating well. No vomiting. No upper abdomina; pain. No prior upper endoscopy.   He uses prune juice occasionally. He usuallly does have a bowel movement in the morning each day prior to work, endorses straining. Denies routine NSAID use.  Colonoscopy 12/21/2012 - hemorrhoids, diverticulosis   Past Medical History:  Diagnosis Date  . Anemia   . Childhood asthma   . Diverticulosis   . HLD (hyperlipidemia)   . HTN (hypertension)   . Iron deficiency anemia    s/p unrevealing colonoscopy 12/2012 referred back to GI 2015  . Obesity, unspecified      Past Surgical History:  Procedure Laterality Date  . COLONOSCOPY  12/2012   int hem, diverticulosis ([place])  . HERNIA REPAIR  1994   Family History  Problem Relation Age of Onset  . Cervical cancer Mother   . Healthy Father   . Coronary artery disease Maternal Grandfather 56       MI  . Hypertension Neg Hx   . Hyperlipidemia Neg Hx   . Diabetes Neg Hx   . Stroke Neg Hx   . Stomach cancer Neg Hx   . Colon cancer Neg Hx    Social History  Substance Use Topics  .  Smoking status: Never Smoker  . Smokeless tobacco: Never Used  . Alcohol use No   Current Outpatient Prescriptions  Medication Sig Dispense Refill  . amLODipine (NORVASC) 5 MG tablet Take 1 tablet (5 mg total) by mouth daily. 90 tablet 3  . ferrous sulfate 325 (65 FE) MG tablet Take 325 mg by mouth daily with breakfast.    . hydrochlorothiazide (HYDRODIURIL) 25 MG tablet TAKE ONE TABLET BY MOUTH EVERY DAY 90 tablet 3  . ibuprofen (ADVIL,MOTRIN) 200 MG tablet Take 200 mg by mouth every 6 (six) hours as needed.    . traMADol (ULTRAM) 50 MG tablet Take 1 tablet (50 mg total) by mouth every 12 (twelve) hours as needed for moderate pain. 50 tablet 0  . Na Sulfate-K Sulfate-Mg Sulf 17.5-3.13-1.6 GM/180ML SOLN Take 1 kit by mouth once. 354 mL 0   No current facility-administered medications for this visit.    Allergies  Allergen Reactions  . Peanut-Containing Drug Products     REACTION: swelling     Review of Systems: All systems reviewed and negative except where noted in HPI.   Lab Results  Component Value Date   WBC 4.7 12/13/2016   HGB 11.7 (L) 12/13/2016   HCT 36.3 (L) 12/13/2016   MCV 80 12/13/2016  PLT 252 12/13/2016    Lab Results  Component Value Date   CREATININE 1.07 12/13/2016   BUN 17 12/13/2016   NA 139 12/13/2016   K 4.2 12/13/2016   CL 99 12/13/2016   CO2 25 12/13/2016    Lab Results  Component Value Date   ALT 9 12/13/2016   AST 12 12/13/2016   ALKPHOS 37 (L) 12/13/2016   BILITOT 1.3 (H) 12/13/2016   Lab Results  Component Value Date   IRON 113 12/13/2016   TIBC 309 12/13/2016   FERRITIN 24 (L) 12/13/2016     Physical Exam: BP 114/72   Pulse 90   Ht 5' 7" (1.702 m)   Wt 206 lb (93.4 kg)   BMI 32.26 kg/m  Constitutional: Pleasant,well-developed, male in no acute distress. HEENT: Normocephalic and atraumatic. Conjunctivae are normal. No scleral icterus. Neck supple.  Cardiovascular: Normal rate, regular rhythm.  Pulmonary/chest: Effort  normal and breath sounds normal. No wheezing, rales or rhonchi. Abdominal: Soft, nondistended, nontender. Bowel sounds active throughout. There are no masses palpable. No hepatomegaly Anoscopy / DRE - external hemorrhoids, large inflamed internal hemorrhoids no mass lesion Extremities: no edema Lymphadenopathy: No cervical adenopathy noted. Neurological: Alert and oriented to person place and time. Skin: Skin is warm and dry. No rashes noted. Psychiatric: Normal mood and affect. Behavior is normal.   ASSESSMENT AND PLAN: Iron deficiency anemia  Hemorrhoids  51 y/o male here for a consultation to assess the following issues:  Iron deficiency anemia - longstanding, mild anemia, persistent on oral iron (last labs done while on oral iron). Unclear etiology. He does have rectal bleeding, but this is a new complaint, his anemia dates back several years. Last colonoscopy 2014. On DRE / anoscopy he does have large inflamed hemorrhoids which are very likely the cause for his rectal bleeding. That being said, given his ongoing anemia which predates these symptoms, I think he warrants a repeated colonoscopy with his bleeding symptoms, and an upper endoscopy as well. If these exams are negative, we then may consider a capsule study. I have discussed the risks / benefits of endoscopy and sedation with him, he wanted to proceed. Further recommendations pending the results. We may consider IV iron pending is course.   Hemorrhoids - inflamed on anoscopy. He does have straining / constipation, recommend Miralax once daily and titrate to ensure stools soft without straining. I discussed options to treat hemorrhoids to include topical steroids, banding, surgery. I think he may be a good candidate for banding but he's having discomfort in the perianal area right now, no obvious fissure noted. He will think about banding but wants to hold off and await colonoscopy first, will treat with hydrocortizone cream on glycerin  suppository (due to cost of anusol) and see if that will help in the interim.   Further recommendations pending the results. All questions answered.   Meade Cellar, MD [facility] Pager [phone]  CC: Ria Bush, MD

## 2017-05-30 ENCOUNTER — Telehealth: Payer: Self-pay | Admitting: Gastroenterology

## 2017-05-30 NOTE — Telephone Encounter (Signed)
New instructions mailed to pt. 

## 2017-06-13 ENCOUNTER — Other Ambulatory Visit: Payer: Self-pay

## 2017-06-13 ENCOUNTER — Ambulatory Visit (AMBULATORY_SURGERY_CENTER): Payer: 59 | Admitting: Gastroenterology

## 2017-06-13 ENCOUNTER — Encounter: Payer: Self-pay | Admitting: Gastroenterology

## 2017-06-13 VITALS — BP 124/85 | HR 83 | Temp 98.9°F | Resp 13 | Ht 67.0 in | Wt 206.0 lb

## 2017-06-13 DIAGNOSIS — D649 Anemia, unspecified: Secondary | ICD-10-CM

## 2017-06-13 DIAGNOSIS — D509 Iron deficiency anemia, unspecified: Secondary | ICD-10-CM | POA: Diagnosis present

## 2017-06-13 DIAGNOSIS — K648 Other hemorrhoids: Secondary | ICD-10-CM | POA: Diagnosis not present

## 2017-06-13 DIAGNOSIS — K625 Hemorrhage of anus and rectum: Secondary | ICD-10-CM | POA: Diagnosis not present

## 2017-06-13 MED ORDER — SODIUM CHLORIDE 0.9 % IV SOLN: 500.0000 mL | INTRAVENOUS | Status: AC

## 2017-06-13 NOTE — Op Note (Signed)
[facility] Patient Name: Tae Vonada Procedure Date: 06/13/2017 10:43 AM MRN: [id number] Endoscopist: Viviann Spare P. Riyan Gavina MD, MD Age: 51 Referring MD:  Date of Birth: May 16, 1966 Gender: Male Account #: [id number] Procedure:                Colonoscopy Indications:              Hematochezia, Iron deficiency anemia Medicines:                Monitored Anesthesia Care Procedure:                Pre-Anesthesia Assessment:                           - Prior to the procedure, a History and Physical                            was performed, and patient medications and                            allergies were reviewed. The patient's tolerance of                            previous anesthesia was also reviewed. The risks                            and benefits of the procedure and the sedation                            options and risks were discussed with the patient.                            All questions were answered, and informed consent                            was obtained. Prior Anticoagulants: The patient has                            taken no previous anticoagulant or antiplatelet                            agents. ASA Grade Assessment: II - A patient with                            mild systemic disease. After reviewing the risks                            and benefits, the patient was deemed in                            satisfactory condition to undergo the procedure.                           After obtaining informed consent, the colonoscope  was passed under direct vision. Throughout the                            procedure, the patient's blood pressure, pulse, and                            oxygen saturations were monitored continuously. The                            Colonoscope was introduced through the anus and                            advanced to the the terminal ileum, with                            identification of the  appendiceal orifice and IC                            valve. The colonoscopy was performed without                            difficulty. The patient tolerated the procedure                            well. The quality of the bowel preparation was                            adequate. The terminal ileum, ileocecal valve,                            appendiceal orifice, and rectum were photographed. Scope In: 10:47:26 AM Scope Out: 11:03:48 AM Scope Withdrawal Time: 0 hours 11 minutes 7 seconds  Total Procedure Duration: 0 hours 16 minutes 22 seconds  Findings:                 The perianal exam findings include non-thrombosed                            external hemorrhoids.                           The terminal ileum appeared normal.                           A few small-mouthed diverticula were found in the                            ascending colon.                           Internal hemorrhoids were found during                            retroflexion. The hemorrhoids were large.  The exam was otherwise without abnormality. Complications:            No immediate complications. Estimated blood loss:                            None. Estimated Blood Loss:     Estimated blood loss: none. Impression:               - Non-thrombosed external hemorrhoids found on                            perianal exam.                           - The examined portion of the ileum was normal.                           - Diverticulosis in the ascending colon.                           - Large inflamed internal hemorrhoids, which are                            the cause for rectal bleeding.                           - The examination was otherwise normal.                           - No specimens collected.                           Overall, large hemorrhoids could be the cause of                            iron deficiency, but upper endoscopy is recommended                             if patient willing at some point in time to ensure                            no other pathology. Recommendation:           - Patient has a contact number available for                            emergencies. The signs and symptoms of potential                            delayed complications were discussed with the                            patient. Return to normal activities tomorrow.                            Written discharge instructions were provided to the  patient.                           - Resume previous diet.                           - Continue present medications.                           - Repeat colonoscopy in 10 years for screening                            purposes.                           - Continue medical management of hemorrhoids                            (Miralax, topical hydrocortizone as needed)                           - Hemorrhoid banding is recommended if symptoms                            persist, for more definitive therapy of hemorrhoids                           - Please go to the lab for CBC, repeat iron studies                           - EGD when able, to further evaluate anemia given                            longstanding iron deficiency Viviann Spare P. Nicha Hemann MD, MD 06/13/2017 11:12:39 AM This report has been signed electronically.

## 2017-06-13 NOTE — Patient Instructions (Signed)
Impression/recommendations:  Hemorrhoids (handout given) Diverticulosis (handout given) High Fiber Diet (handout given)  YOU HAD AN ENDOSCOPIC PROCEDURE TODAY AT THE [facility]:   Refer to the procedure report that was given to you for any specific questions about what was found during the examination.  If the procedure report does not answer your questions, please call your gastroenterologist to clarify.  If you requested that your care partner not be given the details of your procedure findings, then the procedure report has been included in a sealed envelope for you to review at your convenience later.  YOU SHOULD EXPECT: Some feelings of bloating in the abdomen. Passage of more gas than usual.  Walking can help get rid of the air that was put into your GI tract during the procedure and reduce the bloating. If you had a lower endoscopy (such as a colonoscopy or flexible sigmoidoscopy) you may notice spotting of blood in your stool or on the toilet paper. If you underwent a bowel prep for your procedure, you may not have a normal bowel movement for a few days.  Please Note:  You might notice some irritation and congestion in your nose or some drainage.  This is from the oxygen used during your procedure.  There is no need for concern and it should clear up in a day or so.  SYMPTOMS TO REPORT IMMEDIATELY:   Following lower endoscopy (colonoscopy or flexible sigmoidoscopy):  Excessive amounts of blood in the stool  Significant tenderness or worsening of abdominal pains  Swelling of the abdomen that is new, acute  Fever of 100F or higher  For urgent or emergent issues, a gastroenterologist can be reached at any hour by calling [phone].   DIET:  We do recommend a small meal at first, but then you may proceed to your regular diet.  Drink plenty of fluids but you should avoid alcoholic beverages for 24 hours.  ACTIVITY:  You should plan to take it easy for the rest of  today and you should NOT DRIVE or use heavy machinery until tomorrow (because of the sedation medicines used during the test).    FOLLOW UP: Our staff will call the number listed on your records the next business day following your procedure to check on you and address any questions or concerns that you may have regarding the information given to you following your procedure. If we do not reach you, we will leave a message.  However, if you are feeling well and you are not experiencing any problems, there is no need to return our call.  We will assume that you have returned to your regular daily activities without incident.  If any biopsies were taken you will be contacted by phone or by letter within the next 1-3 weeks.  Please call us at [phone] if you have not heard about the biopsies in 3 weeks.    SIGNATURES/CONFIDENTIALITY: You and/or your care partner have signed paperwork which will be entered into your electronic medical record.  These signatures attest to the fact that that the information above on your After Visit Summary has been reviewed and is understood.  Full responsibility of the confidentiality of this discharge information lies with you and/or your care-partner.

## 2017-06-13 NOTE — Progress Notes (Signed)
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  He states he was told downstairs that it "wouldn't be a problem to [facility]  I double checked with our clinic coordinator, who states we must send pathology to [facility] per our physicians decision.  I explained this to the pt, who then spoke with his wife.  They are ok to proceed with procedure as scheduled.  I will mention situation to Dr. Adela LankArmbruster.  I apologized to the pt for the miscommunication.

## 2017-06-13 NOTE — Progress Notes (Signed)
Report to PACU, RN, vss, BBS= Clear.  

## 2017-06-16 ENCOUNTER — Telehealth: Payer: Self-pay

## 2017-06-16 ENCOUNTER — Telehealth: Payer: Self-pay | Admitting: *Deleted

## 2017-06-16 NOTE — Telephone Encounter (Signed)
No answer, number identifier. Message left to call if any questions or concerns and that we would make an attempt to call again later in the day.

## 2017-06-16 NOTE — Telephone Encounter (Signed)
[name] [name], [name] [name], 51 y.o., 1966/08/31 Weight:  206 lb (93.4 kg) Phone:  [phone] (M) ... PCP:  Eustaquio BoydenGutierrez, Javier, MD FYI General MRN:  [id number] MyChart:  Active Next Appt:  None   hemorrhoid banding  Received: 3 days ago  Message Contents  Armbruster, Reeves ForthSteven Paul, MD  Dorene SorrowHill, Kyli Sorter N, LPN        Hi [name],  I just finished a colonoscopy on this patient. He needs to be scheduled for a banding in the next available routine spot. Can you give him a call next week to schedule? Thanks      Left message for pt to return call. He can be scheduled in the next available hemorrhoid spot.

## 2017-06-16 NOTE — Telephone Encounter (Signed)
No answer. Number identifier. Message left to call if questions or concerns. 

## 2017-06-18 ENCOUNTER — Telehealth: Payer: Self-pay

## 2017-06-18 ENCOUNTER — Other Ambulatory Visit: Payer: Self-pay | Admitting: Gastroenterology

## 2017-06-18 NOTE — Telephone Encounter (Signed)
Left message for pt to return call.  Per Dr. Adela LankArmbruster pt need to be scheduled for his first banding appt. It can be in the next available spot.

## 2017-06-19 ENCOUNTER — Telehealth: Payer: Self-pay

## 2017-06-19 LAB — CBC WITH DIFFERENTIAL/PLATELET
BASOS ABS: 0 10*3/uL (ref 0.0–0.2)
Basos: 0 %
EOS (ABSOLUTE): 0.1 10*3/uL (ref 0.0–0.4)
Eos: 1 %
Hematocrit: 37.2 % — ABNORMAL LOW (ref 37.5–51.0)
Hemoglobin: 11.7 g/dL — ABNORMAL LOW (ref 13.0–17.7)
IMMATURE GRANS (ABS): 0 10*3/uL (ref 0.0–0.1)
IMMATURE GRANULOCYTES: 0 %
LYMPHS: 22 %
Lymphocytes Absolute: 1.1 10*3/uL (ref 0.7–3.1)
MCH: 24.4 pg — AB (ref 26.6–33.0)
MCHC: 31.5 g/dL (ref 31.5–35.7)
MCV: 78 fL — ABNORMAL LOW (ref 79–97)
MONOS ABS: 0.5 10*3/uL (ref 0.1–0.9)
Monocytes: 10 %
NEUTROS ABS: 3.3 10*3/uL (ref 1.4–7.0)
NEUTROS PCT: 67 %
Platelets: 297 10*3/uL (ref 150–379)
RBC: 4.79 x10E6/uL (ref 4.14–5.80)
RDW: 15.6 % — AB (ref 12.3–15.4)
WBC: 4.9 10*3/uL (ref 3.4–10.8)

## 2017-06-19 LAB — FERRITIN: FERRITIN: 21 ng/mL — AB (ref 30–400)

## 2017-06-19 LAB — IRON AND TIBC
IRON: 145 ug/dL (ref 38–169)
Iron Saturation: 43 % (ref 15–55)
TIBC: 336 ug/dL (ref 250–450)
UIBC: 191 ug/dL (ref 111–343)

## 2017-06-19 NOTE — Telephone Encounter (Signed)
Pt wishes to not schedule an appointment for hemorrhoid banding at this time. He wants a little more time to think about it.  Information regarding banding procedure mailed to pts home address.

## 2017-07-17 ENCOUNTER — Encounter: Payer: 59 | Admitting: Gastroenterology

## 2017-08-11 ENCOUNTER — Other Ambulatory Visit: Payer: Self-pay | Admitting: Family Medicine

## 2017-08-11 NOTE — Telephone Encounter (Signed)
Last phoned in:  04/18/17, #50 Last OV (CPE):  12/09/16 Next OV:  None No CSA or uds

## 2017-08-12 MED ORDER — TRAMADOL HCL 50 MG PO TABS: 50.0000 mg | ORAL_TABLET | Freq: Two times a day (BID) | ORAL | 0 refills | Status: DC | PRN

## 2017-08-12 NOTE — Telephone Encounter (Signed)
Left refill on voice mail at pharmacy  

## 2017-08-12 NOTE — Telephone Encounter (Signed)
plz phone in. I don't do UDS for tramadol.

## 2017-09-12 ENCOUNTER — Telehealth: Payer: Self-pay | Admitting: Gastroenterology

## 2017-09-12 NOTE — Telephone Encounter (Signed)
Patient is ready to schedule his first hemorrhoid banding procedure, he wants to schedule this the week of Jan 7th. Unfortunately, that schedule is not opened up yet, so have put a reminder in Epic to call him when it opens. He will also check back mid Nov to see if schedule is open.

## 2017-10-02 ENCOUNTER — Telehealth: Payer: Self-pay

## 2017-10-02 NOTE — Telephone Encounter (Signed)
-----   Message from Leverne HumblesJulia M Fournier, RN sent at 09/12/2017  8:20 AM EDT ----- Patient wants to schedule hem band week of Jan 7th, call mid Nov.

## 2017-10-02 NOTE — Telephone Encounter (Signed)
Patient called back and has been scheduled for a hemorrhoid banding on 11/25/17 at 3:45pm.

## 2017-10-02 NOTE — Telephone Encounter (Signed)
Left message for patient that our schedule is available for January to call back if he wishes to schedule a hemorrhoid banding.

## 2017-11-25 ENCOUNTER — Encounter: Payer: 59 | Admitting: Gastroenterology

## 2017-11-25 ENCOUNTER — Encounter: Payer: Self-pay | Admitting: Family Medicine

## 2017-11-25 ENCOUNTER — Ambulatory Visit (INDEPENDENT_AMBULATORY_CARE_PROVIDER_SITE_OTHER): Payer: Managed Care, Other (non HMO) | Admitting: Family Medicine

## 2017-11-25 VITALS — BP 134/84 | HR 91 | Temp 97.9°F | Ht 67.5 in | Wt 218.5 lb

## 2017-11-25 DIAGNOSIS — D509 Iron deficiency anemia, unspecified: Secondary | ICD-10-CM | POA: Diagnosis not present

## 2017-11-25 DIAGNOSIS — I10 Essential (primary) hypertension: Secondary | ICD-10-CM | POA: Diagnosis not present

## 2017-11-25 DIAGNOSIS — Z0000 Encounter for general adult medical examination without abnormal findings: Secondary | ICD-10-CM | POA: Diagnosis not present

## 2017-11-25 DIAGNOSIS — E669 Obesity, unspecified: Secondary | ICD-10-CM | POA: Diagnosis not present

## 2017-11-25 DIAGNOSIS — E785 Hyperlipidemia, unspecified: Secondary | ICD-10-CM | POA: Diagnosis not present

## 2017-11-25 DIAGNOSIS — M2550 Pain in unspecified joint: Secondary | ICD-10-CM

## 2017-11-25 MED ORDER — HYDROCHLOROTHIAZIDE 25 MG PO TABS: ORAL_TABLET | ORAL | 3 refills | Status: DC

## 2017-11-25 MED ORDER — AMLODIPINE BESYLATE 5 MG PO TABS: 5.0000 mg | ORAL_TABLET | Freq: Every day | ORAL | 3 refills | Status: DC

## 2017-11-25 MED ORDER — TRAMADOL HCL 50 MG PO TABS: 50.0000 mg | ORAL_TABLET | Freq: Two times a day (BID) | ORAL | 0 refills | Status: DC | PRN

## 2017-11-25 NOTE — Assessment & Plan Note (Signed)
Encouraged healthy diet and lifestyle changes to affect sustainable weight loss.  

## 2017-11-25 NOTE — Assessment & Plan Note (Signed)
Recent colonoscopy reviewed. Presumed IDA from inflamed hemorrhoid. Pt needs to schedule banding, declines for now. Has declined EGD due to cost concerns.

## 2017-11-25 NOTE — Patient Instructions (Addendum)
Labs today Good to see you, return as needed or in 1 year for next physical.  Health Maintenance, Male A healthy lifestyle and preventive care is important for your health and wellness. Ask your health care provider about what schedule of regular examinations is right for you. What should I know about weight and diet? Eat a Healthy Diet  Eat plenty of vegetables, fruits, whole grains, low-fat dairy products, and lean protein.  Do not eat a lot of foods high in solid fats, added sugars, or salt.  Maintain a Healthy Weight Regular exercise can help you achieve or maintain a healthy weight. You should:  Do at least 150 minutes of exercise each week. The exercise should increase your heart rate and make you sweat (moderate-intensity exercise).  Do strength-training exercises at least twice a week.  Watch Your Levels of Cholesterol and Blood Lipids  Have your blood tested for lipids and cholesterol every 5 years starting at 53 years of age. If you are at high risk for heart disease, you should start having your blood tested when you are 53 years old. You may need to have your cholesterol levels checked more often if: ? Your lipid or cholesterol levels are high. ? You are older than 53 years of age. ? You are at high risk for heart disease.  What should I know about cancer screening? Many types of cancers can be detected early and may often be prevented. Lung Cancer  You should be screened every year for lung cancer if: ? You are a current smoker who has smoked for at least 30 years. ? You are a former smoker who has quit within the past 15 years.  Talk to your health care provider about your screening options, when you should start screening, and how often you should be screened.  Colorectal Cancer  Routine colorectal cancer screening usually begins at 53 years of age and should be repeated every 5-10 years until you are 53 years old. You may need to be screened more often if early  forms of precancerous polyps or small growths are found. Your health care provider may recommend screening at an earlier age if you have risk factors for colon cancer.  Your health care provider may recommend using home test kits to check for hidden blood in the stool.  A small camera at the end of a tube can be used to examine your colon (sigmoidoscopy or colonoscopy). This checks for the earliest forms of colorectal cancer.  Prostate and Testicular Cancer  Depending on your age and overall health, your health care provider may do certain tests to screen for prostate and testicular cancer.  Talk to your health care provider about any symptoms or concerns you have about testicular or prostate cancer.  Skin Cancer  Check your skin from head to toe regularly.  Tell your health care provider about any new moles or changes in moles, especially if: ? There is a change in a mole's size, shape, or color. ? You have a mole that is larger than a pencil eraser.  Always use sunscreen. Apply sunscreen liberally and repeat throughout the day.  Protect yourself by wearing long sleeves, pants, a wide-brimmed hat, and sunglasses when outside.  What should I know about heart disease, diabetes, and high blood pressure?  If you are 6318-53 years of age, have your blood pressure checked every 3-5 years. If you are 240 years of age or older, have your blood pressure checked every year. You should  have your blood pressure measured twice-once when you are at a hospital or clinic, and once when you are not at a hospital or clinic. Record the average of the two measurements. To check your blood pressure when you are not at a hospital or clinic, you can use: ? An automated blood pressure machine at a pharmacy. ? A home blood pressure monitor.  Talk to your health care provider about your target blood pressure.  If you are between 20-51 years old, ask your health care provider if you should take aspirin to prevent  heart disease.  Have regular diabetes screenings by checking your fasting blood sugar level. ? If you are at a normal weight and have a low risk for diabetes, have this test once every three years after the age of 48. ? If you are overweight and have a high risk for diabetes, consider being tested at a younger age or more often.  A one-time screening for abdominal aortic aneurysm (AAA) by ultrasound is recommended for men aged 81-75 years who are current or former smokers. What should I know about preventing infection? Hepatitis B If you have a higher risk for hepatitis B, you should be screened for this virus. Talk with your health care provider to find out if you are at risk for hepatitis B infection. Hepatitis C Blood testing is recommended for:  Everyone born from [birth year] through 1966.  Anyone with known risk factors for hepatitis C.  Sexually Transmitted Diseases (STDs)  You should be screened each year for STDs including gonorrhea and chlamydia if: ? You are sexually active and are younger than 53 years of age. ? You are older than 53 years of age and your health care provider tells you that you are at risk for this type of infection. ? Your sexual activity has changed since you were last screened and you are at an increased risk for chlamydia or gonorrhea. Ask your health care provider if you are at risk.  Talk with your health care provider about whether you are at high risk of being infected with HIV. Your health care provider may recommend a prescription medicine to help prevent HIV infection.  What else can I do?  Schedule regular health, dental, and eye exams.  Stay current with your vaccines (immunizations).  Do not use any tobacco products, such as cigarettes, chewing tobacco, and e-cigarettes. If you need help quitting, ask your health care provider.  Limit alcohol intake to no more than 2 drinks per day. One drink equals 12 ounces of beer, 5 ounces of wine, or 1 ounces  of hard liquor.  Do not use street drugs.  Do not share needles.  Ask your health care provider for help if you need support or information about quitting drugs.  Tell your health care provider if you often feel depressed.  Tell your health care provider if you have ever been abused or do not feel safe at home. This information is not intended to replace advice given to you by your health care provider. Make sure you discuss any questions you have with your health care provider. Document Released: 05/02/2008 Document Revised: 07/03/2016 Document Reviewed: 08/08/2015 Elsevier Interactive Patient Education  [employer].

## 2017-11-25 NOTE — Assessment & Plan Note (Signed)
Chronic, stable. Continue current regimen. 

## 2017-11-25 NOTE — Assessment & Plan Note (Signed)
Chronic, off medication. Update FLP. The 10-year ASCVD risk score [name](Goff DC Montez HagemanJr., et al., 2013) is: 10.7%   Values used to calculate the score:     Age: 89 years     Sex: Male     Is Non-Hispanic African American: Yes     Diabetic: No     Tobacco smoker: No     Systolic Blood Pressure: 134 mmHg     Is BP treated: Yes     HDL Cholesterol: 50 mg/dL     Total Cholesterol: 215 mg/dL

## 2017-11-25 NOTE — Assessment & Plan Note (Signed)
Preventative protocols reviewed and updated unless pt declined. Discussed healthy diet and lifestyle.  

## 2017-11-25 NOTE — Assessment & Plan Note (Signed)
Tramadol refilled. Also takes PRN ibuprofen a few times a week.

## 2017-11-25 NOTE — Progress Notes (Signed)
BP 134/84 (BP Location: Left Arm, Patient Position: Sitting, Cuff Size: Large)   Pulse 91   Temp 97.9 F (36.6 C) (Oral)   Ht 5' 7.5" (1.715 m)   Wt 218 lb 8 oz (99.1 kg)   SpO2 99%   BMI 33.72 kg/m    CC: CPE Subjective:    Patient ID: Barrett ShellLee Montemayor, male    DOB: 13-Oct-1966, 52 y.o.   MRN: [id number]  HPI: Barrett ShellLee Alberty is a 52 y.o. male presenting on 11/25/2017 for Annual Exam   Some stiffening of joints and ongoing R knee discomfort worse with climbing stairs. Tramadol and ibuprofen help, but notes some itching with tramadol.   Iron deficiency anemia - s/p normal colonoscopy, referred back to Dr Arlyce DiceKaplan 04/2014 but later cancelled appointment. Did return to see GI 2018s/p rpt colonoscopy with inflamed hemorrhoid planned banding today. Denies indigestion, GERD, dysphagia or nausea.   Preventative: COLONOSCOPY Date: 12/2012 int hem, diverticulosis ([facility])  COLONOSCOPY 05/2017 inflamed int hem possible source of IDA, offered EGD to patient, diverticulosis, rpt 10 yrs (Armbruster) Prostate - desires check yearly  Flu shot - declines Tetanus shot - 2011  Seat belt use discussed Sunscreen use discussed. 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 Interim medical history since our last visit reviewed. Allergies and medications reviewed and updated. Outpatient Medications Prior to Visit  Medication Sig Dispense Refill  . ferrous sulfate 325 (65 FE) MG tablet Take 325 mg by mouth daily with breakfast.    . ibuprofen (ADVIL,MOTRIN) 200 MG tablet Take 200 mg by mouth every 6 (six) hours as needed.    Marland Kitchen. amLODipine (NORVASC) 5 MG tablet Take 1 tablet (5 mg total) by mouth daily. 90 tablet 3  .  hydrochlorothiazide (HYDRODIURIL) 25 MG tablet TAKE ONE TABLET BY MOUTH EVERY DAY 90 tablet 3  . traMADol (ULTRAM) 50 MG tablet Take 1 tablet (50 mg total) by mouth every 12 (twelve) hours as needed for moderate pain. 50 tablet 0   Facility-Administered Medications Prior to Visit  Medication Dose Route Frequency Provider Last Rate Last Dose  . 0.9 %  sodium chloride infusion  500 mL Intravenous Continuous Armbruster, Willaim RayasSteven P, MD         Per HPI unless specifically indicated in ROS section below Review of Systems  Constitutional: Negative for activity change, appetite change, chills, fatigue, fever and unexpected weight change.  HENT: Negative for hearing loss.   Eyes: Negative for visual disturbance.  Respiratory: Negative for cough, chest tightness, shortness of breath and wheezing.   Cardiovascular: Negative for chest pain, palpitations and leg swelling.  Gastrointestinal: Positive for blood in stool. Negative for abdominal distention, abdominal pain, constipation, diarrhea, nausea and vomiting.  Genitourinary: Negative for difficulty urinating and hematuria.  Musculoskeletal: Negative for arthralgias, myalgias and neck pain.  Skin: Negative for rash.  Neurological: Negative for dizziness, seizures, syncope and headaches.  Hematological: Negative for adenopathy. Does not bruise/bleed easily.  Psychiatric/Behavioral: Negative for dysphoric mood. The patient is nervous/anxious (occasionally overwhelming).        Objective:    BP 134/84 (BP Location: Left Arm, Patient Position: Sitting, Cuff Size: Large)   Pulse 91   Temp 97.9 F (36.6 C) (Oral)  Ht 5' 7.5" (1.715 m)   Wt 218 lb 8 oz (99.1 kg)   SpO2 99%   BMI 33.72 kg/m   Wt Readings from Last 3 Encounters:  11/25/17 218 lb 8 oz (99.1 kg)  06/13/17 206 lb (93.4 kg)  05/28/17 206 lb (93.4 kg)    Physical Exam  Constitutional: He is oriented to person, place, and time. He appears well-developed and well-nourished. No  distress.  HENT:  Head: Normocephalic and atraumatic.  Right Ear: Hearing, tympanic membrane, external ear and ear canal normal.  Left Ear: Hearing, tympanic membrane, external ear and ear canal normal.  Nose: Nose normal.  Mouth/Throat: Uvula is midline, oropharynx is clear and moist and mucous membranes are normal. No oropharyngeal exudate, posterior oropharyngeal edema or posterior oropharyngeal erythema.  Eyes: Conjunctivae and EOM are normal. Pupils are equal, round, and reactive to light. No scleral icterus.  Neck: Normal range of motion. Neck supple. Carotid bruit is not present. No thyromegaly present.  Cardiovascular: Normal rate, regular rhythm, normal heart sounds and intact distal pulses.  No murmur heard. Pulses:      Radial pulses are 2+ on the right side, and 2+ on the left side.  Pulmonary/Chest: Effort normal and breath sounds normal. No respiratory distress. He has no wheezes. He has no rales.  Abdominal: Soft. Bowel sounds are normal. He exhibits no distension and no mass. There is no tenderness. There is no rebound and no guarding.  Genitourinary: Rectum normal and prostate normal. Rectal exam shows no external hemorrhoid, no internal hemorrhoid (not appreciated), no fissure, no mass, no tenderness and anal tone normal. Prostate is not enlarged (20gm) and not tender.  Musculoskeletal: Normal range of motion. He exhibits no edema.  Lymphadenopathy:    He has no cervical adenopathy.  Neurological: He is alert and oriented to person, place, and time.  CN grossly intact, station and gait intact  Skin: Skin is warm and dry. No rash noted.  Psychiatric: He has a normal mood and affect. His behavior is normal. Judgment and thought content normal.  Nursing note and vitals reviewed.  Results for orders placed or performed in visit on 06/18/17  CBC with Differential/Platelet  Result Value Ref Range   WBC 4.9 3.4 - 10.8 x10E3/uL   RBC 4.79 4.14 - 5.80 x10E6/uL   Hemoglobin 11.7  (L) 13.0 - 17.7 g/dL   Hematocrit 91.4 (L) 78.2 - 51.0 %   MCV 78 (L) 79 - 97 fL   MCH 24.4 (L) 26.6 - 33.0 pg   MCHC 31.5 31.5 - 35.7 g/dL   RDW 95.6 (H) 21.3 - 08.6 %   Platelets 297 150 - 379 x10E3/uL   Neutrophils 67 Not Estab. %   Lymphs 22 Not Estab. %   Monocytes 10 Not Estab. %   Eos 1 Not Estab. %   Basos 0 Not Estab. %   Neutrophils Absolute 3.3 1.4 - 7.0 x10E3/uL   Lymphocytes Absolute 1.1 0.7 - 3.1 x10E3/uL   Monocytes Absolute 0.5 0.1 - 0.9 x10E3/uL   EOS (ABSOLUTE) 0.1 0.0 - 0.4 x10E3/uL   Basophils Absolute 0.0 0.0 - 0.2 x10E3/uL   Immature Granulocytes 0 Not Estab. %   Immature Grans (Abs) 0.0 0.0 - 0.1 x10E3/uL  Iron and TIBC  Result Value Ref Range   Total Iron Binding Capacity 336 250 - 450 ug/dL   UIBC 578 469 - 629 ug/dL   Iron 528 38 - 413 ug/dL   Iron Saturation 43 15 - 55 %  Ferritin  Result Value Ref Range   Ferritin 21 (L) 30 - 400 ng/mL      Assessment & Plan:   Problem List Items Addressed This Visit    Arthralgia    Tramadol refilled. Also takes PRN ibuprofen a few times a week.       Health maintenance examination - Primary    Preventative protocols reviewed and updated unless pt declined. Discussed healthy diet and lifestyle.       HLD (hyperlipidemia)    Chronic, off medication. Update FLP. The 10-year ASCVD risk score Denman George DC Montez Hageman., et al., 2013) is: 10.7%   Values used to calculate the score:     Age: 48 years     Sex: Male     Is Non-Hispanic African American: Yes     Diabetic: No     Tobacco smoker: No     Systolic Blood Pressure: 134 mmHg     Is BP treated: Yes     HDL Cholesterol: 50 mg/dL     Total Cholesterol: 215 mg/dL       Relevant Medications   amLODipine (NORVASC) 5 MG tablet   hydrochlorothiazide (HYDRODIURIL) 25 MG tablet   HTN (hypertension)    Chronic, stable. Continue current regimen.      Relevant Medications   amLODipine (NORVASC) 5 MG tablet   hydrochlorothiazide (HYDRODIURIL) 25 MG tablet   Iron  deficiency anemia    Recent colonoscopy reviewed. Presumed IDA from inflamed hemorrhoid. Pt needs to schedule banding, declines for now. Has declined EGD due to cost concerns.       Obesity, Class I, BMI 30-34.9    Encouraged healthy diet and lifestyle changes to affect sustainable weight loss.           Follow up plan: No Follow-up on file.  Eustaquio Boyden, MD

## 2017-12-09 ENCOUNTER — Other Ambulatory Visit: Payer: Self-pay | Admitting: Family Medicine

## 2017-12-10 LAB — COMPREHENSIVE METABOLIC PANEL
A/G RATIO: 1.8 (ref 1.2–2.2)
ALT: 14 IU/L (ref 0–44)
AST: 11 IU/L (ref 0–40)
Albumin: 4.6 g/dL (ref 3.5–5.5)
Alkaline Phosphatase: 41 IU/L (ref 39–117)
BUN/Creatinine Ratio: 14 (ref 9–20)
BUN: 18 mg/dL (ref 6–24)
Bilirubin Total: 1 mg/dL (ref 0.0–1.2)
CALCIUM: 9.7 mg/dL (ref 8.7–10.2)
CO2: 24 mmol/L (ref 20–29)
CREATININE: 1.27 mg/dL (ref 0.76–1.27)
Chloride: 99 mmol/L (ref 96–106)
GFR, EST AFRICAN AMERICAN: 75 mL/min/{1.73_m2} (ref >59)
GFR, EST NON AFRICAN AMERICAN: 65 mL/min/{1.73_m2} (ref >59)
GLOBULIN, TOTAL: 2.6 g/dL (ref 1.5–4.5)
Glucose: 108 mg/dL — ABNORMAL HIGH (ref 65–99)
POTASSIUM: 4 mmol/L (ref 3.5–5.2)
SODIUM: 139 mmol/L (ref 134–144)
TOTAL PROTEIN: 7.2 g/dL (ref 6.0–8.5)

## 2017-12-10 LAB — ANEMIA PROFILE B
BASOS: 0 %
Basophils Absolute: 0 10*3/uL (ref 0.0–0.2)
EOS (ABSOLUTE): 0.1 10*3/uL (ref 0.0–0.4)
EOS: 2 %
FERRITIN: 19 ng/mL — AB (ref 30–400)
Folate: 13.7 ng/mL (ref >3.0)
HEMATOCRIT: 35.7 % — AB (ref 37.5–51.0)
HEMOGLOBIN: 11.7 g/dL — AB (ref 13.0–17.7)
IMMATURE GRANS (ABS): 0 10*3/uL (ref 0.0–0.1)
IRON SATURATION: 18 % (ref 15–55)
Immature Granulocytes: 0 %
Iron: 68 ug/dL (ref 38–169)
LYMPHS: 31 %
Lymphocytes Absolute: 1.9 10*3/uL (ref 0.7–3.1)
MCH: 25.3 pg — ABNORMAL LOW (ref 26.6–33.0)
MCHC: 32.8 g/dL (ref 31.5–35.7)
MCV: 77 fL — ABNORMAL LOW (ref 79–97)
MONOCYTES: 10 %
Monocytes Absolute: 0.6 10*3/uL (ref 0.1–0.9)
Neutrophils Absolute: 3.4 10*3/uL (ref 1.4–7.0)
Neutrophils: 57 %
Platelets: 309 10*3/uL (ref 150–379)
RBC: 4.63 x10E6/uL (ref 4.14–5.80)
RDW: 14.7 % (ref 12.3–15.4)
RETIC CT PCT: 1.2 % (ref 0.6–2.6)
TIBC: 387 ug/dL (ref 250–450)
UIBC: 319 ug/dL (ref 111–343)
Vitamin B-12: 662 pg/mL (ref 232–1245)
WBC: 6.1 10*3/uL (ref 3.4–10.8)

## 2017-12-10 LAB — LIPID PANEL W/O CHOL/HDL RATIO
Cholesterol, Total: 226 mg/dL — ABNORMAL HIGH (ref 100–199)
HDL: 53 mg/dL (ref >39)
LDL CALC: 150 mg/dL — AB (ref 0–99)
Triglycerides: 116 mg/dL (ref 0–149)
VLDL Cholesterol Cal: 23 mg/dL (ref 5–40)

## 2017-12-10 LAB — SPECIMEN STATUS REPORT

## 2017-12-10 LAB — PSA: PROSTATE SPECIFIC AG, SERUM: 1.2 ng/mL (ref 0.0–4.0)

## 2017-12-22 ENCOUNTER — Encounter: Payer: Self-pay | Admitting: Family Medicine

## 2017-12-23 MED ORDER — HYDROXYZINE HCL 25 MG PO TABS: 12.5000 mg | ORAL_TABLET | Freq: Two times a day (BID) | ORAL | 0 refills | Status: DC | PRN

## 2017-12-23 NOTE — Telephone Encounter (Signed)
Pt's pharmacy list has been updated to [facility] [address]  [facility] [address] has been removed.

## 2018-07-01 ENCOUNTER — Other Ambulatory Visit: Payer: Self-pay | Admitting: Family Medicine

## 2018-07-02 MED ORDER — HYDROXYZINE HCL 25 MG PO TABS: 12.5000 mg | ORAL_TABLET | Freq: Two times a day (BID) | ORAL | 0 refills | Status: DC | PRN

## 2018-07-02 NOTE — Telephone Encounter (Signed)
Name of Medication: Tramadol Name of Pharmacy: [facility] [address] Last Fill or Written Date and Quantity: 12/05/17, #50 Last Office Visit and Type: 11/25/17, CPE Next Office Visit and Type: none Last Controlled Substance Agreement Date: none Last UDS: none

## 2018-07-03 MED ORDER — TRAMADOL HCL 50 MG PO TABS: 50.0000 mg | ORAL_TABLET | Freq: Two times a day (BID) | ORAL | 0 refills | Status: DC | PRN

## 2018-07-03 NOTE — Telephone Encounter (Signed)
Eprescribed.

## 2018-07-03 NOTE — Addendum Note (Signed)
Addended by: Eustaquio BoydenGUTIERREZ, Ragnar Waas on: 07/03/2018 05:32 PM   Modules accepted: Orders

## 2018-09-28 ENCOUNTER — Encounter: Payer: Self-pay | Admitting: Family Medicine

## 2018-11-30 ENCOUNTER — Encounter: Payer: Managed Care, Other (non HMO) | Admitting: Family Medicine

## 2018-12-03 ENCOUNTER — Encounter: Payer: Self-pay | Admitting: Family Medicine

## 2018-12-03 ENCOUNTER — Ambulatory Visit (INDEPENDENT_AMBULATORY_CARE_PROVIDER_SITE_OTHER): Payer: Managed Care, Other (non HMO) | Admitting: Family Medicine

## 2018-12-03 ENCOUNTER — Other Ambulatory Visit: Payer: Self-pay | Admitting: Family Medicine

## 2018-12-03 VITALS — BP 136/86 | HR 93 | Temp 97.6°F | Ht 67.0 in | Wt 229.8 lb

## 2018-12-03 DIAGNOSIS — Z0000 Encounter for general adult medical examination without abnormal findings: Secondary | ICD-10-CM | POA: Diagnosis not present

## 2018-12-03 DIAGNOSIS — E785 Hyperlipidemia, unspecified: Secondary | ICD-10-CM

## 2018-12-03 DIAGNOSIS — I10 Essential (primary) hypertension: Secondary | ICD-10-CM

## 2018-12-03 DIAGNOSIS — M2550 Pain in unspecified joint: Secondary | ICD-10-CM

## 2018-12-03 DIAGNOSIS — Z6835 Body mass index (BMI) 35.0-35.9, adult: Secondary | ICD-10-CM

## 2018-12-03 DIAGNOSIS — N400 Enlarged prostate without lower urinary tract symptoms: Secondary | ICD-10-CM | POA: Insufficient documentation

## 2018-12-03 DIAGNOSIS — N401 Enlarged prostate with lower urinary tract symptoms: Secondary | ICD-10-CM

## 2018-12-03 DIAGNOSIS — Z125 Encounter for screening for malignant neoplasm of prostate: Secondary | ICD-10-CM

## 2018-12-03 DIAGNOSIS — D509 Iron deficiency anemia, unspecified: Secondary | ICD-10-CM | POA: Diagnosis not present

## 2018-12-03 DIAGNOSIS — R351 Nocturia: Secondary | ICD-10-CM

## 2018-12-03 MED ORDER — HYDROXYZINE HCL 25 MG PO TABS: 12.5000 mg | ORAL_TABLET | Freq: Two times a day (BID) | ORAL | 0 refills | Status: DC | PRN

## 2018-12-03 MED ORDER — HYDROCHLOROTHIAZIDE 25 MG PO TABS: ORAL_TABLET | ORAL | 3 refills | Status: DC

## 2018-12-03 MED ORDER — AMLODIPINE BESYLATE 5 MG PO TABS: 5.0000 mg | ORAL_TABLET | Freq: Every day | ORAL | 3 refills | Status: DC

## 2018-12-03 MED ORDER — TRAMADOL HCL 50 MG PO TABS: 50.0000 mg | ORAL_TABLET | Freq: Two times a day (BID) | ORAL | 0 refills | Status: DC | PRN

## 2018-12-03 NOTE — Progress Notes (Addendum)
BP 136/86 (BP Location: Left Arm, Patient Position: Sitting, Cuff Size: Large)   Pulse 93   Temp 97.6 F (36.4 C) (Oral)   Ht 5\' 7"  (1.702 m)   Wt 229 lb 12 oz (104.2 kg)   SpO2 100%   BMI 35.98 kg/m    CC: CPE Subjective:    Patient ID: [name] [name], male    DOB: 04/26/1966, 53 y.o.   MRN: [id number]  HPI: [name] [name] is a 53 y.o. male presenting on 12/03/2018 for Annual Exam   IDA - s/u colonoscopy 2018 ([facility]) with inflamed hemorrhoid as possible cause. Denies ongoing trouble.   Requests lab slip to [facility].   Requests hydroxyzine used PRN anxiety. Not overly sedating.  Requests tramadol refill for back pain and knee pain. Also uses ibuprofen PRN.  Denies blood in stool or urine. Occasional blood with wiping.  Occasional GERD.   Preventative: COLONOSCOPY Date: 12/2012 int hem, diverticulosis ([facility])  COLONOSCOPY 05/2017 inflamed int hem possible source of IDA, offered EGD to patient, diverticulosis, rpt 10 yrs ([facility]) Prostate - desires check yearly. Nocturia x2-3, some weakening of stream. Somewhat bothersome.  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 Interim medical history since our last visit reviewed. Allergies and medications reviewed and updated. Outpatient Medications Prior to Visit  Medication Sig Dispense Refill  . ferrous sulfate 325 (65 FE) MG tablet Take 325 mg by mouth daily with breakfast.    . ibuprofen (ADVIL,MOTRIN) 200 MG tablet Take 200 mg by mouth every 6 (six) hours as needed.    Marland Kitchen amLODipine (NORVASC) 5 MG tablet Take  1 tablet (5 mg total) by mouth daily. 90 tablet 3  . hydrochlorothiazide (HYDRODIURIL) 25 MG tablet TAKE ONE TABLET BY MOUTH EVERY DAY 90 tablet 3  . hydrOXYzine (ATARAX/VISTARIL) 25 MG tablet Take 0.5-1 tablets (12.5-25 mg total) by mouth 2 (two) times daily as needed for anxiety (sedation precautions). 30 tablet 0  . traMADol (ULTRAM) 50 MG tablet Take 1 tablet (50 mg total) by mouth every 12 (twelve) hours as needed for moderate pain. 50 tablet 0   No facility-administered medications prior to visit.      Per HPI unless specifically indicated in ROS section below Review of Systems  Constitutional: Negative for activity change, appetite change, chills, fatigue, fever and unexpected weight change.  HENT: Negative for hearing loss.   Eyes: Negative for visual disturbance.  Respiratory: Negative for cough, chest tightness, shortness of breath and wheezing.   Cardiovascular: Negative for chest pain, palpitations and leg swelling.  Gastrointestinal: Negative for abdominal distention, abdominal pain, blood in stool, constipation, diarrhea, nausea and vomiting.  Genitourinary: Negative for difficulty urinating and hematuria.  Musculoskeletal: Negative for arthralgias, myalgias and neck pain.  Skin: Negative for rash.  Neurological: Negative for dizziness, seizures, syncope and headaches.  Hematological: Negative for adenopathy. Does not bruise/bleed easily.  Psychiatric/Behavioral: Negative for dysphoric mood. The patient is not nervous/anxious.    Objective:    BP 136/86 (BP Location: Left Arm, Patient Position: Sitting, Cuff Size: Large)   Pulse 93   Temp 97.6  F (36.4 C) (Oral)   Ht 5\' 7"  (1.702 m)   Wt 229 lb 12 oz (104.2 kg)   SpO2 100%   BMI 35.98 kg/m   Wt Readings from Last 3 Encounters:  12/03/18 229 lb 12 oz (104.2 kg)  11/25/17 218 lb 8 oz (99.1 kg)  06/13/17 206 lb (93.4 kg)    Physical Exam Vitals signs and nursing note reviewed.  Constitutional:      General: He is  not in acute distress.    Appearance: He is well-developed.  HENT:     Head: Normocephalic and atraumatic.     Right Ear: Hearing, tympanic membrane, ear canal and external ear normal.     Left Ear: Hearing, tympanic membrane, ear canal and external ear normal.     Nose: Nose normal.     Mouth/Throat:     Pharynx: Uvula midline. No oropharyngeal exudate or posterior oropharyngeal erythema.  Eyes:     General: No scleral icterus.    Conjunctiva/sclera: Conjunctivae normal.     Pupils: Pupils are equal, round, and reactive to light.  Neck:     Musculoskeletal: Normal range of motion and neck supple.  Cardiovascular:     Rate and Rhythm: Normal rate and regular rhythm.     Pulses:          Radial pulses are 2+ on the right side and 2+ on the left side.     Heart sounds: Normal heart sounds. No murmur.  Pulmonary:     Effort: Pulmonary effort is normal. No respiratory distress.     Breath sounds: Normal breath sounds. No wheezing or rales.  Abdominal:     General: Bowel sounds are normal. There is no distension.     Palpations: Abdomen is soft. There is no mass.     Tenderness: There is no abdominal tenderness. There is no guarding or rebound.  Genitourinary:    Prostate: Enlarged (30gm). Not tender and no nodules present.     Rectum: Normal. No mass, tenderness, anal fissure, external hemorrhoid or internal hemorrhoid. Normal anal tone.  Musculoskeletal: Normal range of motion.  Lymphadenopathy:     Cervical: No cervical adenopathy.  Skin:    General: Skin is warm and dry.     Findings: No rash.  Neurological:     Mental Status: He is alert and oriented to person, place, and time.     Comments: CN grossly intact, station and gait intact  Psychiatric:        Behavior: Behavior normal.        Thought Content: Thought content normal.        Judgment: Judgment normal.       Results for orders placed or performed in visit on 12/09/17  Anemia Profile B  Result Value Ref Range     Total Iron Binding Capacity 387 250 - 450 ug/dL   UIBC 161 096 - 045 ug/dL   Iron 68 38 - 409 ug/dL   Iron Saturation 18 15 - 55 %   Ferritin 19 (L) 30 - 400 ng/mL   Vitamin B-12 662 232 - 1,245 pg/mL   Folate 13.7 >3.0 ng/mL   WBC 6.1 3.4 - 10.8 x10E3/uL   RBC 4.63 4.14 - 5.80 x10E6/uL   Hemoglobin 11.7 (L) 13.0 - 17.7 g/dL   Hematocrit 81.1 (L) 91.4 - 51.0 %   MCV 77 (L) 79 - 97 fL   MCH 25.3 (L) 26.6 - 33.0 pg   MCHC 32.8 31.5 -  35.7 g/dL   RDW 82.9 56.2 - 13.0 %   Platelets 309 150 - 379 x10E3/uL   Neutrophils 57 Not Estab. %   Lymphs 31 Not Estab. %   Monocytes 10 Not Estab. %   Eos 2 Not Estab. %   Basos 0 Not Estab. %   Neutrophils Absolute 3.4 1.4 - 7.0 x10E3/uL   Lymphocytes Absolute 1.9 0.7 - 3.1 x10E3/uL   Monocytes Absolute 0.6 0.1 - 0.9 x10E3/uL   EOS (ABSOLUTE) 0.1 0.0 - 0.4 x10E3/uL   Basophils Absolute 0.0 0.0 - 0.2 x10E3/uL   Immature Granulocytes 0 Not Estab. %   Immature Grans (Abs) 0.0 0.0 - 0.1 x10E3/uL   Retic Ct Pct 1.2 0.6 - 2.6 %  Comprehensive metabolic panel  Result Value Ref Range   Glucose 108 (H) 65 - 99 mg/dL   BUN 18 6 - 24 mg/dL   Creatinine, Ser 8.65 0.76 - 1.27 mg/dL   GFR calc non Af Amer 65 >59 mL/min/1.73   GFR calc Af Amer 75 >59 mL/min/1.73   BUN/Creatinine Ratio 14 9 - 20   Sodium 139 134 - 144 mmol/L   Potassium 4.0 3.5 - 5.2 mmol/L   Chloride 99 96 - 106 mmol/L   CO2 24 20 - 29 mmol/L   Calcium 9.7 8.7 - 10.2 mg/dL   Total Protein 7.2 6.0 - 8.5 g/dL   Albumin 4.6 3.5 - 5.5 g/dL   Globulin, Total 2.6 1.5 - 4.5 g/dL   Albumin/Globulin Ratio 1.8 1.2 - 2.2   Bilirubin Total 1.0 0.0 - 1.2 mg/dL   Alkaline Phosphatase 41 39 - 117 IU/L   AST 11 0 - 40 IU/L   ALT 14 0 - 44 IU/L  Lipid Panel w/o Chol/HDL Ratio  Result Value Ref Range   Cholesterol, Total 226 (H) 100 - 199 mg/dL   Triglycerides 784 0 - 149 mg/dL   HDL 53 >69 mg/dL   VLDL Cholesterol Cal 23 5 - 40 mg/dL   LDL Calculated 629 (H) 0 - 99 mg/dL  PSA  Result  Value Ref Range   Prostate Specific Ag, Serum 1.2 0.0 - 4.0 ng/mL  Specimen status report  Result Value Ref Range   specimen status report Comment    Assessment & Plan:   Problem List Items Addressed This Visit    Severe obesity (BMI 35.0-35.9 with comorbidity) (HCC)    Encouraged healthy diet and lifestyle changes to affect sustainable weight loss. Obesity contributes to chronic medical conditions.       Iron deficiency anemia    Update labs. Latest colonoscopy reassuring 2018. Continues iron tablet daily.       Relevant Orders   CBC with Differential/Platelet   Iron, TIBC and Ferritin Panel   HTN (hypertension)    Chronic, stable. Continue current regimen.       Relevant Medications   amLODipine (NORVASC) 5 MG tablet   hydrochlorothiazide (HYDRODIURIL) 25 MG tablet   Other Relevant Orders   Microalbumin / creatinine urine ratio   HLD (hyperlipidemia)    Chronic, off medication. Update FLP. The 10-year ASCVD risk score Denman George DC Montez Hageman., et al., 2013) is: 11.4%   Values used to calculate the score:     Age: 89 years     Sex: Male     Is Non-Hispanic African American: Yes     Diabetic: No     Tobacco smoker: No     Systolic Blood Pressure: 136 mmHg     Is BP  treated: Yes     HDL Cholesterol: 53 mg/dL     Total Cholesterol: 226 mg/dL       Relevant Medications   amLODipine (NORVASC) 5 MG tablet   hydrochlorothiazide (HYDRODIURIL) 25 MG tablet   Other Relevant Orders   Lipid panel   Comprehensive metabolic panel   Health maintenance examination - Primary    Preventative protocols reviewed and updated unless pt declined. Discussed healthy diet and lifestyle.       BPH (benign prostatic hyperplasia)    DRE with evidence of enlargement, PSA has been reassuring. Discussed medical management options. IPSS 6/2 - mild. Will monitor for now off medication.       Arthralgia    Ongoing back and knee pain - tramadol refilled. Sparing use. Also on regular NSAID (ibuprofen)        Other Visit Diagnoses    Special screening for malignant neoplasm of prostate       Relevant Orders   PSA       Meds ordered this encounter  Medications  . amLODipine (NORVASC) 5 MG tablet    Sig: Take 1 tablet (5 mg total) by mouth daily.    Dispense:  90 tablet    Refill:  3  . hydrochlorothiazide (HYDRODIURIL) 25 MG tablet    Sig: TAKE ONE TABLET BY MOUTH EVERY DAY    Dispense:  90 tablet    Refill:  3  . hydrOXYzine (ATARAX/VISTARIL) 25 MG tablet    Sig: Take 0.5-1 tablets (12.5-25 mg total) by mouth 2 (two) times daily as needed for anxiety (sedation precautions).    Dispense:  30 tablet    Refill:  0  . traMADol (ULTRAM) 50 MG tablet    Sig: Take 1 tablet (50 mg total) by mouth every 12 (twelve) hours as needed for moderate pain.    Dispense:  50 tablet    Refill:  0   Orders Placed This Encounter  Procedures  . Lipid panel    Standing Status:   Future    Number of Occurrences:   1    Standing Expiration Date:   12/04/2019  . Comprehensive metabolic panel    Standing Status:   Future    Number of Occurrences:   1    Standing Expiration Date:   12/04/2019  . PSA    Standing Status:   Future    Number of Occurrences:   1    Standing Expiration Date:   12/04/2019  . CBC with Differential/Platelet    Standing Status:   Future    Number of Occurrences:   1    Standing Expiration Date:   12/04/2019  . Microalbumin / creatinine urine ratio    Standing Status:   Future    Number of Occurrences:   1    Standing Expiration Date:   12/04/2019  . Iron, TIBC and Ferritin Panel    Standing Status:   Future    Number of Occurrences:   1    Standing Expiration Date:   12/04/2019    Follow up plan: Return in about 1 year (around 12/04/2019), or if symptoms worsen or fail to improve, for annual exam, prior fasting for blood work.  Eustaquio BoydenJavier Rip Hawes, MD

## 2018-12-03 NOTE — Addendum Note (Signed)
Addended by: Alvina Chou on: 12/03/2018 10:36 AM   Modules accepted: Orders

## 2018-12-03 NOTE — Assessment & Plan Note (Signed)
DRE with evidence of enlargement, PSA has been reassuring. Discussed medical management options. IPSS 6/2 - mild. Will monitor for now off medication.

## 2018-12-03 NOTE — Addendum Note (Signed)
Addended by: Eustaquio Boyden on: 12/03/2018 10:35 AM   Modules accepted: Orders

## 2018-12-03 NOTE — Assessment & Plan Note (Signed)
Chronic, off medication. Update FLP. The 10-year ASCVD risk score Denman George DC Montez Hageman., et al., 2013) is: 11.4%   Values used to calculate the score:     Age: 53 years     Sex: Male     Is Non-Hispanic African American: Yes     Diabetic: No     Tobacco smoker: No     Systolic Blood Pressure: 136 mmHg     Is BP treated: Yes     HDL Cholesterol: 53 mg/dL     Total Cholesterol: 226 mg/dL

## 2018-12-03 NOTE — Patient Instructions (Addendum)
Get labs done at [facility] (slip provided today)  Prostate questionairre today.  You are doing well today.   Health Maintenance, Male A healthy lifestyle and preventive care is important for your health and wellness. Ask your health care provider about what schedule of regular examinations is right for you. What should I know about weight and diet? Eat a Healthy Diet  Eat plenty of vegetables, fruits, whole grains, low-fat dairy products, and lean protein.  Do not eat a lot of foods high in solid fats, added sugars, or salt.  Maintain a Healthy Weight Regular exercise can help you achieve or maintain a healthy weight. You should:  Do at least 150 minutes of exercise each week. The exercise should increase your heart rate and make you sweat (moderate-intensity exercise).  Do strength-training exercises at least twice a week. Watch Your Levels of Cholesterol and Blood Lipids  Have your blood tested for lipids and cholesterol every 5 years starting at 54 years of age. If you are at high risk for heart disease, you should start having your blood tested when you are 54 years old. You may need to have your cholesterol levels checked more often if: ? Your lipid or cholesterol levels are high. ? You are older than 54 years of age. ? You are at high risk for heart disease. What should I know about cancer screening? Many types of cancers can be detected early and may often be prevented. Lung Cancer  You should be screened every year for lung cancer if: ? You are a current smoker who has smoked for at least 30 years. ? You are a former smoker who has quit within the past 15 years.  Talk to your health care provider about your screening options, when you should start screening, and how often you should be screened. Colorectal Cancer  Routine colorectal cancer screening usually begins at 54 years of age and should be repeated every 5-10 years until you are 54 years old. You may need to be  screened more often if early forms of precancerous polyps or small growths are found. Your health care provider may recommend screening at an earlier age if you have risk factors for colon cancer.  Your health care provider may recommend using home test kits to check for hidden blood in the stool.  A small camera at the end of a tube can be used to examine your colon (sigmoidoscopy or colonoscopy). This checks for the earliest forms of colorectal cancer. Prostate and Testicular Cancer  Depending on your age and overall health, your health care provider may do certain tests to screen for prostate and testicular cancer.  Talk to your health care provider about any symptoms or concerns you have about testicular or prostate cancer. Skin Cancer  Check your skin from head to toe regularly.  Tell your health care provider about any new moles or changes in moles, especially if: ? There is a change in a mole's size, shape, or color. ? You have a mole that is larger than a pencil eraser.  Always use sunscreen. Apply sunscreen liberally and repeat throughout the day.  Protect yourself by wearing long sleeves, pants, a wide-brimmed hat, and sunglasses when outside. What should I know about heart disease, diabetes, and high blood pressure?  If you are 25-80 years of age, have your blood pressure checked every 3-5 years. If you are 75 years of age or older, have your blood pressure checked every year. You should have your blood  pressure measured twice-once when you are at a hospital or clinic, and once when you are not at a hospital or clinic. Record the average of the two measurements. To check your blood pressure when you are not at a hospital or clinic, you can use: ? An automated blood pressure machine at a pharmacy. ? A home blood pressure monitor.  Talk to your health care provider about your target blood pressure.  If you are between 47-23 years old, ask your health care provider if you should  take aspirin to prevent heart disease.  Have regular diabetes screenings by checking your fasting blood sugar level. ? If you are at a normal weight and have a low risk for diabetes, have this test once every three years after the age of 33. ? If you are overweight and have a high risk for diabetes, consider being tested at a younger age or more often.  A one-time screening for abdominal aortic aneurysm (AAA) by ultrasound is recommended for men aged 65-75 years who are current or former smokers. What should I know about preventing infection? Hepatitis B If you have a higher risk for hepatitis B, you should be screened for this virus. Talk with your health care provider to find out if you are at risk for hepatitis B infection. Hepatitis C Blood testing is recommended for:  Everyone born from [birth year] through 1966.  Anyone with known risk factors for hepatitis C. Sexually Transmitted Diseases (STDs)  You should be screened each year for STDs including gonorrhea and chlamydia if: ? You are sexually active and are younger than 54 years of age. ? You are older than 54 years of age and your health care provider tells you that you are at risk for this type of infection. ? Your sexual activity has changed since you were last screened and you are at an increased risk for chlamydia or gonorrhea. Ask your health care provider if you are at risk.  Talk with your health care provider about whether you are at high risk of being infected with HIV. Your health care provider may recommend a prescription medicine to help prevent HIV infection. What else can I do?  Schedule regular health, dental, and eye exams.  Stay current with your vaccines (immunizations).  Do not use any tobacco products, such as cigarettes, chewing tobacco, and e-cigarettes. If you need help quitting, ask your health care provider.  Limit alcohol intake to no more than 2 drinks per day. One drink equals 12 ounces of beer, 5 ounces  of wine, or 1 ounces of hard liquor.  Do not use street drugs.  Do not share needles.  Ask your health care provider for help if you need support or information about quitting drugs.  Tell your health care provider if you often feel depressed.  Tell your health care provider if you have ever been abused or do not feel safe at home. This information is not intended to replace advice given to you by your health care provider. Make sure you discuss any questions you have with your health care provider. Document Released: 05/02/2008 Document Revised: 07/03/2016 Document Reviewed: 08/08/2015 Elsevier Interactive Patient Education  2019 [employer].

## 2018-12-03 NOTE — Assessment & Plan Note (Signed)
Encouraged healthy diet and lifestyle changes to affect sustainable weight loss. Obesity contributes to chronic medical conditions.  

## 2018-12-03 NOTE — Assessment & Plan Note (Signed)
Chronic, stable. Continue current regimen. 

## 2018-12-03 NOTE — Assessment & Plan Note (Addendum)
Update labs. Latest colonoscopy reassuring 2018. Continues iron tablet daily.

## 2018-12-03 NOTE — Assessment & Plan Note (Signed)
Ongoing back and knee pain - tramadol refilled. Sparing use. Also on regular NSAID (ibuprofen)

## 2018-12-03 NOTE — Assessment & Plan Note (Signed)
Preventative protocols reviewed and updated unless pt declined. Discussed healthy diet and lifestyle.  

## 2018-12-04 LAB — COMPREHENSIVE METABOLIC PANEL
ALT: 21 IU/L (ref 0–44)
AST: 13 IU/L (ref 0–40)
Albumin/Globulin Ratio: 1.7 (ref 1.2–2.2)
Albumin: 4.5 g/dL (ref 3.5–5.5)
Alkaline Phosphatase: 42 IU/L (ref 39–117)
BUN/Creatinine Ratio: 11 (ref 9–20)
BUN: 13 mg/dL (ref 6–24)
Bilirubin Total: 0.9 mg/dL (ref 0.0–1.2)
CO2: 23 mmol/L (ref 20–29)
CREATININE: 1.2 mg/dL (ref 0.76–1.27)
Calcium: 9.4 mg/dL (ref 8.7–10.2)
Chloride: 98 mmol/L (ref 96–106)
GFR calc Af Amer: 79 mL/min/{1.73_m2} (ref >59)
GFR calc non Af Amer: 69 mL/min/{1.73_m2} (ref >59)
GLOBULIN, TOTAL: 2.7 g/dL (ref 1.5–4.5)
Glucose: 90 mg/dL (ref 65–99)
Potassium: 4.1 mmol/L (ref 3.5–5.2)
SODIUM: 137 mmol/L (ref 134–144)
Total Protein: 7.2 g/dL (ref 6.0–8.5)

## 2018-12-04 LAB — CBC WITH DIFFERENTIAL/PLATELET
Basophils Absolute: 0 10*3/uL (ref 0.0–0.2)
Basos: 1 %
EOS (ABSOLUTE): 0.1 10*3/uL (ref 0.0–0.4)
EOS: 1 %
HEMATOCRIT: 36.5 % — AB (ref 37.5–51.0)
Hemoglobin: 11.7 g/dL — ABNORMAL LOW (ref 13.0–17.7)
Immature Grans (Abs): 0 10*3/uL (ref 0.0–0.1)
Immature Granulocytes: 1 %
Lymphocytes Absolute: 1.1 10*3/uL (ref 0.7–3.1)
Lymphs: 17 %
MCH: 24.7 pg — ABNORMAL LOW (ref 26.6–33.0)
MCHC: 32.1 g/dL (ref 31.5–35.7)
MCV: 77 fL — ABNORMAL LOW (ref 79–97)
MONOCYTES: 9 %
Monocytes Absolute: 0.6 10*3/uL (ref 0.1–0.9)
Neutrophils Absolute: 4.7 10*3/uL (ref 1.4–7.0)
Neutrophils: 71 %
Platelets: 299 10*3/uL (ref 150–450)
RBC: 4.74 x10E6/uL (ref 4.14–5.80)
RDW: 14.8 % (ref 11.6–15.4)
WBC: 6.5 10*3/uL (ref 3.4–10.8)

## 2018-12-04 LAB — LIPID PANEL W/O CHOL/HDL RATIO
Cholesterol, Total: 246 mg/dL — ABNORMAL HIGH (ref 100–199)
HDL: 48 mg/dL (ref >39)
LDL CALC: 172 mg/dL — AB (ref 0–99)
Triglycerides: 130 mg/dL (ref 0–149)
VLDL Cholesterol Cal: 26 mg/dL (ref 5–40)

## 2018-12-04 LAB — FERRITIN: Ferritin: 20 ng/mL — ABNORMAL LOW (ref 30–400)

## 2018-12-04 LAB — MICROALBUMIN, URINE: Microalbumin, Urine: 3.6 ug/mL

## 2018-12-04 LAB — IRON AND TIBC
Iron Saturation: 42 % (ref 15–55)
Iron: 156 ug/dL (ref 38–169)
Total Iron Binding Capacity: 371 ug/dL (ref 250–450)
UIBC: 215 ug/dL (ref 111–343)

## 2018-12-04 LAB — PSA: Prostate Specific Ag, Serum: 1.7 ng/mL (ref 0.0–4.0)

## 2018-12-10 ENCOUNTER — Encounter: Payer: Managed Care, Other (non HMO) | Admitting: Family Medicine

## 2019-02-12 ENCOUNTER — Ambulatory Visit: Payer: Managed Care, Other (non HMO) | Admitting: Family Medicine

## 2019-03-05 ENCOUNTER — Other Ambulatory Visit: Payer: Self-pay | Admitting: Family Medicine

## 2019-03-08 NOTE — Telephone Encounter (Signed)
Name of Medication: Tramadol Name of Pharmacy: [facility] Last Fill or Written Date and Quantity: 12/03/18, #50 Last Office Visit and Type: 12/03/18, CPE Next Office Visit and Type: none Last Controlled Substance Agreement Date: none Last UDS: none

## 2019-03-09 MED ORDER — TRAMADOL HCL 50 MG PO TABS: 50.0000 mg | ORAL_TABLET | Freq: Two times a day (BID) | ORAL | 0 refills | Status: DC | PRN

## 2019-03-09 NOTE — Telephone Encounter (Signed)
Eprescribed.

## 2019-05-29 ENCOUNTER — Other Ambulatory Visit: Payer: Self-pay | Admitting: Family Medicine

## 2019-05-31 NOTE — Telephone Encounter (Signed)
Name of Medication: Tramadol Name of Pharmacy: [facility] Last Fill or Written Date and Quantity: 03/09/19, #50 Last Office Visit and Type: 12/03/18, CPE Next Office Visit and Type: none Last Controlled Substance Agreement Date: none Last UDS: none

## 2019-06-02 MED ORDER — TRAMADOL HCL 50 MG PO TABS: 50.0000 mg | ORAL_TABLET | Freq: Two times a day (BID) | ORAL | 0 refills | Status: DC | PRN

## 2019-06-02 NOTE — Telephone Encounter (Signed)
Eprescribed.

## 2019-07-28 ENCOUNTER — Other Ambulatory Visit: Payer: Self-pay | Admitting: Family Medicine

## 2019-07-28 NOTE — Telephone Encounter (Signed)
Name of Medication: Tramadol Name of Pharmacy: [facility] or Written Date and Quantity: 06/02/19, #50 Last Office Visit and Type: 12/03/18, CPE Next Office Visit and Type: none Last Controlled Substance Agreement Date: none Last UDS: none

## 2019-07-29 ENCOUNTER — Other Ambulatory Visit: Payer: Self-pay | Admitting: Family Medicine

## 2019-07-30 MED ORDER — TRAMADOL HCL 50 MG PO TABS: 50.0000 mg | ORAL_TABLET | Freq: Two times a day (BID) | ORAL | 0 refills | Status: DC | PRN

## 2019-07-30 NOTE — Telephone Encounter (Signed)
Eprescribed.

## 2019-10-01 ENCOUNTER — Other Ambulatory Visit: Payer: Self-pay | Admitting: Family Medicine

## 2019-10-01 ENCOUNTER — Encounter: Payer: Self-pay | Admitting: Family Medicine

## 2019-10-01 NOTE — Telephone Encounter (Signed)
Electronic refill request. Tramadol Last office visit:   12/03/2018 Last Filled:    50 tablet 0 07/30/2019  Please advise.

## 2019-10-01 NOTE — Telephone Encounter (Signed)
I do not see any h/o asthma in pt's chart.  Lvm asking pt to call back.  He will need to schedule a visit.  Also, sent this message to pt via MyChart.

## 2019-10-04 MED ORDER — TRAMADOL HCL 50 MG PO TABS: 50.0000 mg | ORAL_TABLET | Freq: Two times a day (BID) | ORAL | 0 refills | Status: DC | PRN

## 2019-10-04 NOTE — Telephone Encounter (Signed)
ERx 

## 2019-10-27 ENCOUNTER — Encounter: Payer: Self-pay | Admitting: Family Medicine

## 2019-11-22 ENCOUNTER — Ambulatory Visit (INDEPENDENT_AMBULATORY_CARE_PROVIDER_SITE_OTHER): Payer: Managed Care, Other (non HMO) | Admitting: Family Medicine

## 2019-11-22 ENCOUNTER — Other Ambulatory Visit: Payer: Self-pay

## 2019-11-22 ENCOUNTER — Encounter: Payer: Self-pay | Admitting: Family Medicine

## 2019-11-22 VITALS — BP 152/90 | HR 103 | Temp 97.1°F | Ht 67.0 in | Wt 238.6 lb

## 2019-11-22 DIAGNOSIS — K4090 Unilateral inguinal hernia, without obstruction or gangrene, not specified as recurrent: Secondary | ICD-10-CM

## 2019-11-22 DIAGNOSIS — M25562 Pain in left knee: Secondary | ICD-10-CM

## 2019-11-22 DIAGNOSIS — I10 Essential (primary) hypertension: Secondary | ICD-10-CM | POA: Diagnosis not present

## 2019-11-22 DIAGNOSIS — N401 Enlarged prostate with lower urinary tract symptoms: Secondary | ICD-10-CM

## 2019-11-22 DIAGNOSIS — G8929 Other chronic pain: Secondary | ICD-10-CM

## 2019-11-22 DIAGNOSIS — K648 Other hemorrhoids: Secondary | ICD-10-CM

## 2019-11-22 DIAGNOSIS — M25561 Pain in right knee: Secondary | ICD-10-CM

## 2019-11-22 DIAGNOSIS — Z6835 Body mass index (BMI) 35.0-35.9, adult: Secondary | ICD-10-CM

## 2019-11-22 DIAGNOSIS — D509 Iron deficiency anemia, unspecified: Secondary | ICD-10-CM | POA: Diagnosis not present

## 2019-11-22 DIAGNOSIS — Z0001 Encounter for general adult medical examination with abnormal findings: Secondary | ICD-10-CM | POA: Diagnosis not present

## 2019-11-22 DIAGNOSIS — M545 Low back pain, unspecified: Secondary | ICD-10-CM

## 2019-11-22 DIAGNOSIS — E785 Hyperlipidemia, unspecified: Secondary | ICD-10-CM

## 2019-11-22 DIAGNOSIS — R351 Nocturia: Secondary | ICD-10-CM

## 2019-11-22 MED ORDER — TRAMADOL HCL 50 MG PO TABS: 50.0000 mg | ORAL_TABLET | Freq: Two times a day (BID) | ORAL | 0 refills | Status: DC | PRN

## 2019-11-22 MED ORDER — HYDROCORTISONE ACETATE 25 MG RE SUPP: 25.0000 mg | Freq: Two times a day (BID) | RECTAL | 0 refills | Status: DC

## 2019-11-22 MED ORDER — METHOCARBAMOL 500 MG PO TABS: 500.0000 mg | ORAL_TABLET | Freq: Two times a day (BID) | ORAL | 0 refills | Status: DC | PRN

## 2019-11-22 MED ORDER — HYDROXYZINE HCL 25 MG PO TABS: 12.5000 mg | ORAL_TABLET | Freq: Two times a day (BID) | ORAL | 1 refills | Status: DC | PRN

## 2019-11-22 MED ORDER — DOCUSATE SODIUM 100 MG PO CAPS: 100.0000 mg | ORAL_CAPSULE | Freq: Every day | ORAL | Status: AC

## 2019-11-22 MED ORDER — HYDROCHLOROTHIAZIDE 25 MG PO TABS: ORAL_TABLET | ORAL | 3 refills | Status: DC

## 2019-11-22 MED ORDER — AMLODIPINE BESYLATE 5 MG PO TABS: 5.0000 mg | ORAL_TABLET | Freq: Every day | ORAL | 3 refills | Status: DC

## 2019-11-22 NOTE — Progress Notes (Signed)
This visit was conducted in person.  BP (!) 152/90   Pulse (!) 103   Temp (!) 97.1 F (36.2 C) (Skin)   Ht 5\' 7"  (1.702 m)   Wt 238 lb 9.6 oz (108.2 kg)   SpO2 99%   BMI 37.37 kg/m   BP staying elevated.  CC: CPE Subjective:    Patient ID: [name] [name], male    DOB: 1966-07-23, 54 y.o.   MRN: [id number]  HPI: [name] [name] is a 54 y.o. male presenting on 11/22/2019 for Annual Exam   Requests slip for [facility] labs.   HTN - Compliant with current antihypertensive regimen of amlodipine, hctz. Does check blood pressures at home: 140/80s. No low blood pressure readings or symptoms of dizziness/syncope. Denies HA, vision changes, CP/tightness, SOB, leg swelling.   Bilateral anterior knee pain - worse climbing or going down stairs. Worse after prolonged sitting. No knee swelling, redness, warmth. Increasing lower back pain, stiffness. Also with worsening back pain. No inciting trauma/injury. No other joint pains. Treats with tramadol, muscle rubs, ibuprofen 400mg  BID.   Active hemorrhoids, known from prior colonoscopy. These hemorrhoids do bleed twice weekly - notices with wiping. He has to stay in bathroom longer at work, affecting job performance. Some straining on commode. Hemorrhoid flaring several times a month - tends to have trouble 3 days/month.   Notes pulling sensation in R groin ?hernia. Worse the last 2 months. Notes bulge as well.   Preventative: COLONOSCOPY Date: 12/2012 int hem, diverticulosis ([facility]) COLONOSCOPY 05/2017 inflamed int hem possible source of IDA, offered EGD to patient, diverticulosis, rpt 10 yrs([facility]) Prostate - desires check yearly. Nocturia x2-3, some weakening of stream.  Lung cancer screening - not eligible Flu shot - declines Tetanus shot - 2011  Seat belt use discussed Sunscreen use discussed.No suspicious moles on skin  Non smoker  Alcohol - none  Dentist q6 mo  Eye exam yearly   Billing specialist - [facility] Lives with wife and  son Activity: tries to walk daily 30 min  Diet: good water, fruits/vegetables daily, fish 1x/wk     Relevant past medical, surgical, family and social history reviewed and updated as indicated. Interim medical history since our last visit reviewed. Allergies and medications reviewed and updated. Outpatient Medications Prior to Visit  Medication Sig Dispense Refill  . ferrous sulfate 325 (65 FE) MG tablet Take 325 mg by mouth daily with breakfast.    . ibuprofen (ADVIL,MOTRIN) 200 MG tablet Take 200 mg by mouth every 6 (six) hours as needed.    06/2017 amLODipine (NORVASC) 5 MG tablet Take 1 tablet (5 mg total) by mouth daily. 90 tablet 3  . hydrochlorothiazide (HYDRODIURIL) 25 MG tablet TAKE ONE TABLET BY MOUTH EVERY DAY 90 tablet 3  . hydrOXYzine (ATARAX/VISTARIL) 25 MG tablet Take 0.5-1 tablets (12.5-25 mg total) by mouth 2 (two) times daily as needed for anxiety (sedation precautions). 30 tablet 0  . traMADol (ULTRAM) 50 MG tablet Take 1 tablet (50 mg total) by mouth every 12 (twelve) hours as needed for moderate pain. 50 tablet 0   No facility-administered medications prior to visit.     Per HPI unless specifically indicated in ROS section below Review of Systems  Constitutional: Negative for activity change, appetite change, chills, fatigue, fever and unexpected weight change.  HENT: Negative for hearing loss.   Eyes: Negative for visual disturbance.  Respiratory: Negative for cough, chest tightness, shortness of breath and wheezing.   Cardiovascular: Negative for chest pain, palpitations and leg  swelling.  Gastrointestinal: Positive for blood in stool and constipation. Negative for abdominal distention, abdominal pain, diarrhea, nausea and vomiting.  Genitourinary: Negative for difficulty urinating and hematuria.  Musculoskeletal: Positive for arthralgias and back pain. Negative for myalgias and neck pain.  Skin: Negative for rash.  Neurological: Negative for dizziness, seizures,  syncope and headaches.  Hematological: Negative for adenopathy. Does not bruise/bleed easily.  Psychiatric/Behavioral: Negative for dysphoric mood. The patient is not nervous/anxious.    Objective:    BP (!) 152/90   Pulse (!) 103   Temp (!) 97.1 F (36.2 C) (Skin)   Ht 5\' 7"  (1.702 m)   Wt 238 lb 9.6 oz (108.2 kg)   SpO2 99%   BMI 37.37 kg/m   Wt Readings from Last 3 Encounters:  11/22/19 238 lb 9.6 oz (108.2 kg)  12/03/18 229 lb 12 oz (104.2 kg)  11/25/17 218 lb 8 oz (99.1 kg)    Physical Exam Vitals and nursing note reviewed.  Constitutional:      General: He is not in acute distress.    Appearance: Normal appearance. He is well-developed. He is obese. He is not ill-appearing.  HENT:     Head: Normocephalic and atraumatic.     Right Ear: Hearing, tympanic membrane, ear canal and external ear normal.     Left Ear: Hearing, tympanic membrane, ear canal and external ear normal.     Mouth/Throat:     Pharynx: Uvula midline.  Eyes:     General: No scleral icterus.    Extraocular Movements: Extraocular movements intact.     Conjunctiva/sclera: Conjunctivae normal.     Pupils: Pupils are equal, round, and reactive to light.  Cardiovascular:     Rate and Rhythm: Normal rate and regular rhythm.     Pulses: Normal pulses.          Radial pulses are 2+ on the right side and 2+ on the left side.     Heart sounds: Normal heart sounds. No murmur.  Pulmonary:     Effort: Pulmonary effort is normal. No respiratory distress.     Breath sounds: Normal breath sounds. No wheezing, rhonchi or rales.  Abdominal:     General: Abdomen is flat. Bowel sounds are normal. There is no distension.     Palpations: Abdomen is soft. There is no mass.     Tenderness: There is no abdominal tenderness. There is no guarding or rebound.     Hernia: A hernia is present. Hernia is present in the right inguinal area. There is no hernia in the left inguinal area.  Genitourinary:    Penis: Normal.       Testes: Normal.        Right: Mass, tenderness or swelling not present.        Left: Mass, tenderness or swelling not present.     Prostate: Enlarged (25gm). Not tender and no nodules present.     Rectum: No mass, tenderness, anal fissure or external hemorrhoid. Normal anal tone.     Comments: No obvious external hemorrhoid or fissure present Musculoskeletal:        General: Tenderness present. Normal range of motion.     Cervical back: Normal range of motion and neck supple.     Right lower leg: No edema.     Left lower leg: No edema.     Comments:  Discomfort midline mid lumbar spine + paraspinous mm tenderness L>R Neg SLR bilaterally.  No pain with int/ext rotation at hip.  Bilateral knee exam: No deformity on inspection. No pain with palpation of knee landmarks. No effusion/swelling noted. FROM in flex/extension without crepitus. No popliteal fullness. Neg drawer test. Discomfort with mcmurray test. No pain with valgus/varus stress. tender with PFgrind. No abnormal patellar mobility.   Lymphadenopathy:     Cervical: No cervical adenopathy.     Lower Body: No right inguinal adenopathy. No left inguinal adenopathy.  Skin:    General: Skin is warm and dry.     Findings: No rash.  Neurological:     General: No focal deficit present.     Mental Status: He is alert and oriented to person, place, and time.     Comments: CN grossly intact, station and gait intact  Psychiatric:        Mood and Affect: Mood normal.        Thought Content: Thought content normal.        Judgment: Judgment normal.       Results for orders placed or performed in visit on 12/03/18  CBC with Differential/Platelet  Result Value Ref Range   WBC 6.5 3.4 - 10.8 x10E3/uL   RBC 4.74 4.14 - 5.80 x10E6/uL   Hemoglobin 11.7 (L) 13.0 - 17.7 g/dL   Hematocrit 36.5 (L) 37.5 - 51.0 %   MCV 77 (L) 79 - 97 fL   MCH 24.7 (L) 26.6 - 33.0 pg   MCHC 32.1 31.5 - 35.7 g/dL   RDW 14.8 11.6 - 15.4 %   Platelets  299 150 - 450 x10E3/uL   Neutrophils 71 Not Estab. %   Lymphs 17 Not Estab. %   Monocytes 9 Not Estab. %   Eos 1 Not Estab. %   Basos 1 Not Estab. %   Neutrophils Absolute 4.7 1.4 - 7.0 x10E3/uL   Lymphocytes Absolute 1.1 0.7 - 3.1 x10E3/uL   Monocytes Absolute 0.6 0.1 - 0.9 x10E3/uL   EOS (ABSOLUTE) 0.1 0.0 - 0.4 x10E3/uL   Basophils Absolute 0.0 0.0 - 0.2 x10E3/uL   Immature Granulocytes 1 Not Estab. %   Immature Grans (Abs) 0.0 0.0 - 0.1 x10E3/uL  Comprehensive metabolic panel  Result Value Ref Range   Glucose 90 65 - 99 mg/dL   BUN 13 6 - 24 mg/dL   Creatinine, Ser 1.20 0.76 - 1.27 mg/dL   GFR calc non Af Amer 69 >59 mL/min/1.73   GFR calc Af Amer 79 >59 mL/min/1.73   BUN/Creatinine Ratio 11 9 - 20   Sodium 137 134 - 144 mmol/L   Potassium 4.1 3.5 - 5.2 mmol/L   Chloride 98 96 - 106 mmol/L   CO2 23 20 - 29 mmol/L   Calcium 9.4 8.7 - 10.2 mg/dL   Total Protein 7.2 6.0 - 8.5 g/dL   Albumin 4.5 3.5 - 5.5 g/dL   Globulin, Total 2.7 1.5 - 4.5 g/dL   Albumin/Globulin Ratio 1.7 1.2 - 2.2   Bilirubin Total 0.9 0.0 - 1.2 mg/dL   Alkaline Phosphatase 42 39 - 117 IU/L   AST 13 0 - 40 IU/L   ALT 21 0 - 44 IU/L  Lipid Panel w/o Chol/HDL Ratio  Result Value Ref Range   Cholesterol, Total 246 (H) 100 - 199 mg/dL   Triglycerides 130 0 - 149 mg/dL   HDL 48 >39 mg/dL   VLDL Cholesterol Cal 26 5 - 40 mg/dL   LDL Calculated 172 (H) 0 - 99 mg/dL  Iron and TIBC  Result Value Ref Range   Total Iron Binding  Capacity 371 250 - 450 ug/dL   UIBC 527 782 - 423 ug/dL   Iron 536 38 - 144 ug/dL   Iron Saturation 42 15 - 55 %  PSA  Result Value Ref Range   Prostate Specific Ag, Serum 1.7 0.0 - 4.0 ng/mL  Microalbumin, urine  Result Value Ref Range   Microalbumin, Urine 3.6 Not Estab. ug/mL  Ferritin  Result Value Ref Range   Ferritin 20 (L) 30 - 400 ng/mL   Assessment & Plan:  This visit occurred during the SARS-CoV-2 public health emergency.  Safety protocols were in place, including  screening questions prior to the visit, additional usage of staff PPE, and extensive cleaning of exam room while observing appropriate contact time as indicated for disinfecting solutions.   Problem List Items Addressed This Visit    Severe obesity (BMI 35.0-35.9 with comorbidity) (HCC)    Weight gain noted. Encouraged healthy diet and lifestyle changes to affect sustainable weight loss. Reviewed how this would also help back and knee pains.       Right inguinal hernia    Reviewed this. Refer to gen surgery for further evaluation.       Relevant Orders   Ambulatory referral to General Surgery   Lower back pain    Worsened likely weight gain contributing. Try robaxin muscle relaxant for back pain.       Relevant Medications   methocarbamol (ROBAXIN) 500 MG tablet   traMADol (ULTRAM) 50 MG tablet   Iron deficiency anemia    Update labs. Continue iron replacement.       Internal hemorrhoids    Acute worsening - discussed return to GI for definitive management. Will Rx anusol suppositories PRN as well. He is on iron for IDA hx - will recommend he start colace stool softener daily as bowel regimen.  He requests FMLA for intermittent leave for 6 months while he works towards definitive treatment to preserve job safety.       Relevant Medications   hydrochlorothiazide (HYDRODIURIL) 25 MG tablet   amLODipine (NORVASC) 5 MG tablet   HTN (hypertension)    BP elevated today - encouraged continue monitoring BP at home, let me know if persistently elevated.       Relevant Medications   hydrochlorothiazide (HYDRODIURIL) 25 MG tablet   amLODipine (NORVASC) 5 MG tablet   HLD (hyperlipidemia)    Chronic, off medication. Update labs. The ASCVD Risk score Denman George DC Jr., et al., 2013) failed to calculate for the following reasons:   The valid HDL cholesterol range is 0.517 to 2.586 mmol/L   The valid total cholesterol range is 3.362 to 8.275 mmol/L       Relevant Medications    hydrochlorothiazide (HYDRODIURIL) 25 MG tablet   amLODipine (NORVASC) 5 MG tablet   Encounter for routine adult medical exam with abnormal findings - Primary    Preventative protocols reviewed and updated unless pt declined. Discussed healthy diet and lifestyle.       BPH (benign prostatic hyperplasia)    DRE stable. Update PSA.       Bilateral knee pain    Describes bilateral anterior knee pain - anticipate related to OA vs patellofemoral pain syndrome - provided with rehab exercises. Discussed trial glucosamine and/or vitamin D supplements. Update tramadol PRN breakthrough pain.           Meds ordered this encounter  Medications  . hydrocortisone (ANUSOL-HC) 25 MG suppository    Sig: Place 1 suppository (25 mg total) rectally  2 (two) times daily.    Dispense:  12 suppository    Refill:  0  . hydrochlorothiazide (HYDRODIURIL) 25 MG tablet    Sig: TAKE ONE TABLET BY MOUTH EVERY DAY    Dispense:  90 tablet    Refill:  3  . amLODipine (NORVASC) 5 MG tablet    Sig: Take 1 tablet (5 mg total) by mouth daily.    Dispense:  90 tablet    Refill:  3  . hydrOXYzine (ATARAX/VISTARIL) 25 MG tablet    Sig: Take 0.5-1 tablets (12.5-25 mg total) by mouth 2 (two) times daily as needed for anxiety (sedation precautions).    Dispense:  30 tablet    Refill:  1  . docusate sodium (COLACE) 100 MG capsule    Sig: Take 1 capsule (100 mg total) by mouth daily.  . methocarbamol (ROBAXIN) 500 MG tablet    Sig: Take 1 tablet (500 mg total) by mouth 2 (two) times daily as needed for muscle spasms (sedation precautions).    Dispense:  30 tablet    Refill:  0  . traMADol (ULTRAM) 50 MG tablet    Sig: Take 1 tablet (50 mg total) by mouth every 12 (twelve) hours as needed for moderate pain.    Dispense:  50 tablet    Refill:  0   Orders Placed This Encounter  Procedures  . Ambulatory referral to General Surgery    Referral Priority:   Routine    Referral Type:   Surgical    Referral Reason:    Specialty Services Required    Requested Specialty:   General Surgery    Number of Visits Requested:   1    Patient instructions: Get labs at [facility].  Trial glucosamine for 1 month and note effect on joints. If not helping, trial vitamin D 2000 units daily for 1 month. Try exercises provided today for for knees.  Try muscle relaxant for lower back - sent to pharmacy.  While on iron, start taking daily stool softener (colace 100mg  daily).  We will refer you to general surgery for further evaluation of right inguinal hernia.  Try hydrocortisone suppositories for hemorrhoids.  For blood pressure -start monitoring more regularly at home. Let me know if consistently >140/90 to discuss titration of amlodipine.  Return as needed or in 1 year for next physical.   Follow up plan: Return in about 1 year (around 11/21/2020), or if symptoms worsen or fail to improve, for annual exam, prior fasting for blood work.  Eustaquio BoydenJavier Tanganyika Bowlds, MD

## 2019-11-22 NOTE — Assessment & Plan Note (Signed)
Preventative protocols reviewed and updated unless pt declined. Discussed healthy diet and lifestyle.  

## 2019-11-22 NOTE — Telephone Encounter (Signed)
Patient was here for an appt this AM, and states he received all of his medications except for Tramadol. He would like this sent into [facility] on [address].   Thanks!

## 2019-11-22 NOTE — Patient Instructions (Addendum)
Get labs at [facility].  Trial glucosamine for 1 month and note effect on joints. If not helping, trial vitamin D 2000 units daily for 1 month. Try exercises provided today for for knees.  Try muscle relaxant for lower back - sent to pharmacy.  While on iron, start taking daily stool softener (colace 100mg  daily).  We will refer you to general surgery for further evaluation of right inguinal hernia.  Try hydrocortisone suppositories for hemorrhoids.  For blood pressure -start monitoring more regularly at home. Let me know if consistently >140/90 to discuss titration of amlodipine.  Return as needed or in 1 year for next physical.   Health Maintenance, Male Adopting a healthy lifestyle and getting preventive care are important in promoting health and wellness. Ask your health care provider about:  The right schedule for you to have regular tests and exams.  Things you can do on your own to prevent diseases and keep yourself healthy. What should I know about diet, weight, and exercise? Eat a healthy diet   Eat a diet that includes plenty of vegetables, fruits, low-fat dairy products, and lean protein.  Do not eat a lot of foods that are high in solid fats, added sugars, or sodium. Maintain a healthy weight Body mass index (BMI) is a measurement that can be used to identify possible weight problems. It estimates body fat based on height and weight. Your health care provider can help determine your BMI and help you achieve or maintain a healthy weight. Get regular exercise Get regular exercise. This is one of the most important things you can do for your health. Most adults should:  Exercise for at least 150 minutes each week. The exercise should increase your heart rate and make you sweat (moderate-intensity exercise).  Do strengthening exercises at least twice a week. This is in addition to the moderate-intensity exercise.  Spend less time sitting. Even light physical activity can be  beneficial. Watch cholesterol and blood lipids Have your blood tested for lipids and cholesterol at 55 years of age, then have this test every 5 years. You may need to have your cholesterol levels checked more often if:  Your lipid or cholesterol levels are high.  You are older than 55 years of age.  You are at high risk for heart disease. What should I know about cancer screening? Many types of cancers can be detected early and may often be prevented. Depending on your health history and family history, you may need to have cancer screening at various ages. This may include screening for:  Colorectal cancer.  Prostate cancer.  Skin cancer.  Lung cancer. What should I know about heart disease, diabetes, and high blood pressure? Blood pressure and heart disease  High blood pressure causes heart disease and increases the risk of stroke. This is more likely to develop in people who have high blood pressure readings, are of African descent, or are overweight.  Talk with your health care provider about your target blood pressure readings.  Have your blood pressure checked: ? Every 3-5 years if you are 69-61 years of age. ? Every year if you are 71 years old or older.  If you are between the ages of 47 and 12 and are a current or former smoker, ask your health care provider if you should have a one-time screening for abdominal aortic aneurysm (AAA). Diabetes Have regular diabetes screenings. This checks your fasting blood sugar level. Have the screening done:  Once every three years after  age 69 if you are at a normal weight and have a low risk for diabetes.  More often and at a younger age if you are overweight or have a high risk for diabetes. What should I know about preventing infection? Hepatitis B If you have a higher risk for hepatitis B, you should be screened for this virus. Talk with your health care provider to find out if you are at risk for hepatitis B  infection. Hepatitis C Blood testing is recommended for:  Everyone born from [birth year] through 1966.  Anyone with known risk factors for hepatitis C. Sexually transmitted infections (STIs)  You should be screened each year for STIs, including gonorrhea and chlamydia, if: ? You are sexually active and are younger than 56 years of age. ? You are older than 55 years of age and your health care provider tells you that you are at risk for this type of infection. ? Your sexual activity has changed since you were last screened, and you are at increased risk for chlamydia or gonorrhea. Ask your health care provider if you are at risk.  Ask your health care provider about whether you are at high risk for HIV. Your health care provider may recommend a prescription medicine to help prevent HIV infection. If you choose to take medicine to prevent HIV, you should first get tested for HIV. You should then be tested every 3 months for as long as you are taking the medicine. Follow these instructions at home: Lifestyle  Do not use any products that contain nicotine or tobacco, such as cigarettes, e-cigarettes, and chewing tobacco. If you need help quitting, ask your health care provider.  Do not use street drugs.  Do not share needles.  Ask your health care provider for help if you need support or information about quitting drugs. Alcohol use  Do not drink alcohol if your health care provider tells you not to drink.  If you drink alcohol: ? Limit how much you have to 0-2 drinks a day. ? Be aware of how much alcohol is in your drink. In the [place] one drink equals one 12 oz bottle of beer (355 mL), one 5 oz glass of wine (148 mL), or one 1 oz glass of hard liquor (44 mL). General instructions  Schedule regular health, dental, and eye exams.  Stay current with your vaccines.  Tell your health care provider if: ? You often feel depressed. ? You have ever been abused or do not feel safe at  home. Summary  Adopting a healthy lifestyle and getting preventive care are important in promoting health and wellness.  Follow your health care provider's instructions about healthy diet, exercising, and getting tested or screened for diseases.  Follow your health care provider's instructions on monitoring your cholesterol and blood pressure. This information is not intended to replace advice given to you by your health care provider. Make sure you discuss any questions you have with your health care provider. Document Revised: 10/28/2018 Document Reviewed: 10/28/2018 Elsevier Patient Education  2020 [employer].

## 2019-11-23 ENCOUNTER — Encounter: Payer: Self-pay | Admitting: Family Medicine

## 2019-11-23 DIAGNOSIS — K648 Other hemorrhoids: Secondary | ICD-10-CM | POA: Insufficient documentation

## 2019-11-23 DIAGNOSIS — Z8719 Personal history of other diseases of the digestive system: Secondary | ICD-10-CM | POA: Insufficient documentation

## 2019-11-23 DIAGNOSIS — K4090 Unilateral inguinal hernia, without obstruction or gangrene, not specified as recurrent: Secondary | ICD-10-CM | POA: Insufficient documentation

## 2019-11-23 LAB — LIPID PANEL
Chol/HDL Ratio: 4.4 ratio (ref 0.0–5.0)
Cholesterol, Total: 212 mg/dL — ABNORMAL HIGH (ref 100–199)
HDL: 48 mg/dL (ref >39)
LDL Chol Calc (NIH): 143 mg/dL — ABNORMAL HIGH (ref 0–99)
Triglycerides: 116 mg/dL (ref 0–149)
VLDL Cholesterol Cal: 21 mg/dL (ref 5–40)

## 2019-11-23 LAB — CBC WITH DIFFERENTIAL/PLATELET
Basophils Absolute: 0 10*3/uL (ref 0.0–0.2)
Basos: 1 %
EOS (ABSOLUTE): 0.1 10*3/uL (ref 0.0–0.4)
Eos: 1 %
Hematocrit: 34.7 % — ABNORMAL LOW (ref 37.5–51.0)
Hemoglobin: 10.9 g/dL — ABNORMAL LOW (ref 13.0–17.7)
Immature Grans (Abs): 0 10*3/uL (ref 0.0–0.1)
Immature Granulocytes: 1 %
Lymphocytes Absolute: 1 10*3/uL (ref 0.7–3.1)
Lymphs: 15 %
MCH: 23.5 pg — ABNORMAL LOW (ref 26.6–33.0)
MCHC: 31.4 g/dL — ABNORMAL LOW (ref 31.5–35.7)
MCV: 75 fL — ABNORMAL LOW (ref 79–97)
Monocytes Absolute: 0.7 10*3/uL (ref 0.1–0.9)
Monocytes: 10 %
Neutrophils Absolute: 4.6 10*3/uL (ref 1.4–7.0)
Neutrophils: 72 %
Platelets: 322 10*3/uL (ref 150–450)
RBC: 4.63 x10E6/uL (ref 4.14–5.80)
RDW: 15.2 % (ref 11.6–15.4)
WBC: 6.4 10*3/uL (ref 3.4–10.8)

## 2019-11-23 LAB — COMPREHENSIVE METABOLIC PANEL
ALT: 20 IU/L (ref 0–44)
AST: 17 IU/L (ref 0–40)
Albumin/Globulin Ratio: 1.7 (ref 1.2–2.2)
Albumin: 4.5 g/dL (ref 3.8–4.9)
Alkaline Phosphatase: 49 IU/L (ref 39–117)
BUN/Creatinine Ratio: 9 (ref 9–20)
BUN: 11 mg/dL (ref 6–24)
Bilirubin Total: 0.5 mg/dL (ref 0.0–1.2)
CO2: 23 mmol/L (ref 20–29)
Calcium: 9.1 mg/dL (ref 8.7–10.2)
Chloride: 100 mmol/L (ref 96–106)
Creatinine, Ser: 1.25 mg/dL (ref 0.76–1.27)
GFR calc Af Amer: 75 mL/min/{1.73_m2} (ref >59)
GFR calc non Af Amer: 65 mL/min/{1.73_m2} (ref >59)
Globulin, Total: 2.7 g/dL (ref 1.5–4.5)
Glucose: 113 mg/dL — ABNORMAL HIGH (ref 65–99)
Potassium: 3.9 mmol/L (ref 3.5–5.2)
Sodium: 138 mmol/L (ref 134–144)
Total Protein: 7.2 g/dL (ref 6.0–8.5)

## 2019-11-23 LAB — MICROALBUMIN / CREATININE URINE RATIO
Creatinine, Urine: 107.8 mg/dL
Microalb/Creat Ratio: 4 mg/g creat (ref 0–29)
Microalbumin, Urine: 4 ug/mL

## 2019-11-23 LAB — IRON,TIBC AND FERRITIN PANEL
Ferritin: 16 ng/mL — ABNORMAL LOW (ref 30–400)
Iron Saturation: 32 % (ref 15–55)
Iron: 114 ug/dL (ref 38–169)
Total Iron Binding Capacity: 360 ug/dL (ref 250–450)
UIBC: 246 ug/dL (ref 111–343)

## 2019-11-23 LAB — PSA: Prostate Specific Ag, Serum: 1.6 ng/mL (ref 0.0–4.0)

## 2019-11-23 NOTE — Assessment & Plan Note (Addendum)
Worsened likely weight gain contributing. Try robaxin muscle relaxant for back pain.

## 2019-11-23 NOTE — Assessment & Plan Note (Addendum)
Describes bilateral anterior knee pain - anticipate related to OA vs patellofemoral pain syndrome - provided with rehab exercises. Discussed trial glucosamine and/or vitamin D supplements. Update tramadol PRN breakthrough pain.

## 2019-11-23 NOTE — Assessment & Plan Note (Signed)
Reviewed this. Refer to gen surgery for further evaluation.

## 2019-11-23 NOTE — Assessment & Plan Note (Signed)
BP elevated today - encouraged continue monitoring BP at home, let me know if persistently elevated.

## 2019-11-23 NOTE — Assessment & Plan Note (Signed)
Weight gain noted. Encouraged healthy diet and lifestyle changes to affect sustainable weight loss. Reviewed how this would also help back and knee pains.

## 2019-11-23 NOTE — Assessment & Plan Note (Addendum)
Update labs. Continue iron replacement.

## 2019-11-23 NOTE — Assessment & Plan Note (Addendum)
Acute worsening - discussed return to GI for definitive management. Will Rx anusol suppositories PRN as well. He is on iron for IDA hx - will recommend he start colace stool softener daily as bowel regimen.  He requests FMLA for intermittent leave for 6 months while he works towards definitive treatment to preserve job safety.

## 2019-11-23 NOTE — Assessment & Plan Note (Signed)
DRE stable. Update PSA.

## 2019-11-23 NOTE — Assessment & Plan Note (Signed)
Chronic, off medication. Update labs. The ASCVD Risk score Denman George DC Jr., et al., 2013) failed to calculate for the following reasons:   The valid HDL cholesterol range is 0.517 to 2.586 mmol/L   The valid total cholesterol range is 3.362 to 8.275 mmol/L

## 2019-11-24 NOTE — Telephone Encounter (Signed)
This was already refilled.

## 2019-11-24 NOTE — Telephone Encounter (Signed)
Dr. Reece Agar please review the question. I took care of FMLA for the patient (see lab results)

## 2019-11-26 ENCOUNTER — Encounter: Payer: Self-pay | Admitting: Family Medicine

## 2019-11-26 NOTE — Telephone Encounter (Signed)
plz find out what questions he has

## 2019-11-26 NOTE — Telephone Encounter (Signed)
[name] [name], patient contacted the office stating that he had a question for you regarding what was filled out on his FMLA paperwork and stated that some questions were not answered. Is this something you can help with?

## 2019-11-26 NOTE — Telephone Encounter (Signed)
No, I sent it for scanning but left a copy up front for patient to pick up. I looked at the form but did not see a missing section, I am sorry I missed it

## 2019-11-26 NOTE — Telephone Encounter (Signed)
No I am not able to. Patient has a copy of this form and I sent it for scanning already. See other messages also.

## 2019-11-26 NOTE — Telephone Encounter (Signed)
Do you still have this pt's FMLA form?

## 2019-11-29 NOTE — Telephone Encounter (Signed)
See other message

## 2019-11-30 ENCOUNTER — Encounter: Payer: Self-pay | Admitting: Family Medicine

## 2019-11-30 NOTE — Telephone Encounter (Addendum)
Spoke with pt.  States he faxed the info needed to our office and spoke with Zella Ball about it.  [See Pt Msg, 11/23/19.]

## 2019-11-30 NOTE — Telephone Encounter (Signed)
Patient called in regards to the [facility] paperwork he stated it was sent over but the paperwork was incomplete

## 2019-11-30 NOTE — Telephone Encounter (Signed)
Pt aware we sent out copy to scanning to be scanned in his chart. That has not been done yet.  Pt will fax me his copy so we can correct

## 2019-11-30 NOTE — Telephone Encounter (Signed)
Paperwork in Dr Reece Agar in box to initial and date changes

## 2019-12-02 NOTE — Telephone Encounter (Signed)
Filled and in my outbox

## 2019-12-03 NOTE — Telephone Encounter (Signed)
Paperwork faxed Pt is aware Copy for pt Copy for scan

## 2019-12-06 ENCOUNTER — Encounter: Payer: Managed Care, Other (non HMO) | Admitting: Family Medicine

## 2019-12-26 ENCOUNTER — Encounter: Payer: Self-pay | Admitting: Family Medicine

## 2020-01-14 ENCOUNTER — Other Ambulatory Visit: Payer: Self-pay | Admitting: Family Medicine

## 2020-01-17 NOTE — Telephone Encounter (Signed)
Name of Medication: Tramadol Name of Pharmacy: [facility]  Last Fill or Written Date and Quantity: 11/22/19, #50 Last Office Visit and Type: 11/22/19, CPE Next Office Visit and Type: 01/28/20, glucosamine/vit D f/u Last Controlled Substance Agreement Date: none Last UDS: none

## 2020-01-18 ENCOUNTER — Other Ambulatory Visit: Payer: Self-pay | Admitting: Surgery

## 2020-01-18 DIAGNOSIS — D649 Anemia, unspecified: Secondary | ICD-10-CM

## 2020-01-18 MED ORDER — TRAMADOL HCL 50 MG PO TABS: 50.0000 mg | ORAL_TABLET | Freq: Two times a day (BID) | ORAL | 0 refills | Status: DC | PRN

## 2020-01-18 NOTE — Telephone Encounter (Signed)
ERx 

## 2020-01-28 ENCOUNTER — Other Ambulatory Visit: Payer: Self-pay

## 2020-01-28 ENCOUNTER — Ambulatory Visit: Payer: Managed Care, Other (non HMO) | Admitting: Family Medicine

## 2020-01-28 ENCOUNTER — Encounter: Payer: Self-pay | Admitting: Family Medicine

## 2020-01-28 ENCOUNTER — Ambulatory Visit (INDEPENDENT_AMBULATORY_CARE_PROVIDER_SITE_OTHER)
Admission: RE | Admit: 2020-01-28 | Discharge: 2020-01-28 | Disposition: A | Discharge status: Home / Self Care | Payer: Managed Care, Other (non HMO) | Source: Ambulatory Visit | Attending: Family Medicine | Admitting: Family Medicine

## 2020-01-28 VITALS — BP 126/80 | HR 95 | Temp 97.6°F | Ht 67.0 in | Wt 237.4 lb

## 2020-01-28 DIAGNOSIS — D509 Iron deficiency anemia, unspecified: Secondary | ICD-10-CM | POA: Diagnosis not present

## 2020-01-28 DIAGNOSIS — M25562 Pain in left knee: Secondary | ICD-10-CM

## 2020-01-28 DIAGNOSIS — G8929 Other chronic pain: Secondary | ICD-10-CM

## 2020-01-28 DIAGNOSIS — M25561 Pain in right knee: Secondary | ICD-10-CM

## 2020-01-28 DIAGNOSIS — M545 Low back pain: Secondary | ICD-10-CM

## 2020-01-28 DIAGNOSIS — Z6835 Body mass index (BMI) 35.0-35.9, adult: Secondary | ICD-10-CM

## 2020-01-28 DIAGNOSIS — K4090 Unilateral inguinal hernia, without obstruction or gangrene, not specified as recurrent: Secondary | ICD-10-CM

## 2020-01-28 DIAGNOSIS — R0981 Nasal congestion: Secondary | ICD-10-CM

## 2020-01-28 MED ORDER — ACETAMINOPHEN 500 MG PO TABS: 1000.0000 mg | ORAL_TABLET | Freq: Three times a day (TID) | ORAL | Status: AC | PRN

## 2020-01-28 MED ORDER — DICLOFENAC SODIUM 1 % EX GEL: 2.0000 g | Freq: Three times a day (TID) | CUTANEOUS | 3 refills | Status: AC

## 2020-01-28 NOTE — Progress Notes (Signed)
This visit was conducted in person.  BP 126/80 (BP Location: Left Arm, Patient Position: Sitting, Cuff Size: Large)   Pulse 95   Temp 97.6 F (36.4 C) (Temporal)   Ht 5\' 7"  (1.702 m)   Wt 237 lb 6 oz (107.7 kg)   SpO2 98%   BMI 37.18 kg/m    CC: discuss joint pains Subjective:    Patient ID: [name] [name], male    DOB: [birth year], 54 y.o.   MRN: [id number]  HPI: [name] [name] is a 54 y.o. male presenting on 01/28/2020 for Follow-up (Here for glucosamine and vit D f/u.)   From prior visit:  Bilateral anterior knee pain - worse climbing or going down stairs. Worse after prolonged sitting. No knee swelling, redness, warmth. Increasing lower back pain, stiffness. Denies inciting trauma/injury. No other joint pains. Treats with tramadol, muscle relaxant, ibuprofen 400mg  BID.   Also takes tylenol 1000mg  bid.  L>R knee pain - present since 2015 but progressively worsening.  Notes more thoracic back pain ongoing since at least 11/2018.  Some walking.   Glucosamine trial did not help joint pains and caused constipation.  Vitamin D 2000 IU daily did not help joint pains and may have caused nausea.   Takes ibuprofen 400mg  BID tylenol and intermittent robaxin and tramadol use.   1 wk h/o itchy throat, congestion, PNDrainage, ear discomfort, watery eyes. Ongoing nausea.      Relevant past medical, surgical, family and social history reviewed and updated as indicated. Interim medical history since our last visit reviewed. Allergies and medications reviewed and updated. Outpatient Medications Prior to Visit  Medication Sig Dispense Refill  . amLODipine (NORVASC) 5 MG tablet Take 1 tablet (5 mg total) by mouth daily. 90 tablet 3  . docusate sodium (COLACE) 100 MG capsule Take 1 capsule (100 mg total) by mouth daily.    . ferrous sulfate 325 (65 FE) MG tablet Take 325 mg by mouth daily with breakfast.    . hydrochlorothiazide (HYDRODIURIL) 25 MG tablet TAKE ONE TABLET BY MOUTH EVERY DAY 90  tablet 3  . hydrocortisone (ANUSOL-HC) 25 MG suppository Place 1 suppository (25 mg total) rectally 2 (two) times daily. 12 suppository 0  . hydrOXYzine (ATARAX/VISTARIL) 25 MG tablet Take 0.5-1 tablets (12.5-25 mg total) by mouth 2 (two) times daily as needed for anxiety (sedation precautions). 30 tablet 1  . ibuprofen (ADVIL,MOTRIN) 200 MG tablet Take 200 mg by mouth every 6 (six) hours as needed.    . methocarbamol (ROBAXIN) 500 MG tablet Take 1 tablet (500 mg total) by mouth 2 (two) times daily as needed for muscle spasms (sedation precautions). 30 tablet 0  . traMADol (ULTRAM) 50 MG tablet Take 1 tablet (50 mg total) by mouth every 12 (twelve) hours as needed for moderate pain. 50 tablet 0   No facility-administered medications prior to visit.     Per HPI unless specifically indicated in ROS section below Review of Systems Objective:    BP 126/80 (BP Location: Left Arm, Patient Position: Sitting, Cuff Size: Large)   Pulse 95   Temp 97.6 F (36.4 C) (Temporal)   Ht 5\' 7"  (1.702 m)   Wt 237 lb 6 oz (107.7 kg)   SpO2 98%   BMI 37.18 kg/m   Wt Readings from Last 3 Encounters:  01/28/20 237 lb 6 oz (107.7 kg)  11/22/19 238 lb 9.6 oz (108.2 kg)  12/03/18 229 lb 12 oz (104.2 kg)    Physical Exam Vitals and nursing note reviewed.  Constitutional:      Appearance: Normal appearance. He is obese. He is not ill-appearing.  HENT:     Head: Normocephalic and atraumatic.     Nose: Mucosal edema (nasal mucosal congestion) present. No congestion or rhinorrhea.     Mouth/Throat:     Mouth: Mucous membranes are moist.     Pharynx: Oropharynx is clear. No oropharyngeal exudate or posterior oropharyngeal erythema.  Eyes:     Extraocular Movements: Extraocular movements intact.     Pupils: Pupils are equal, round, and reactive to light.  Musculoskeletal:        General: No tenderness. Normal range of motion.     Right lower leg: No edema.     Left lower leg: No edema.     Comments:    Mild midline lower lumbar back pain  No paraspinous mm tenderness Neg SLR bilaterally. No pain with int/ext rotation at hip. Neg FABER. No pain at SIJ, GTB or sciatic notch bilaterally.   Bilateral knee exam: No deformity on inspection. No pain with palpation of knee landmarks. No effusion/swelling noted. FROM in flex/extension with some crepitus. No popliteal fullness. Neg drawer test. Neg mcmurray test. No pain with valgus/varus stress. ++PF grind bilaterally No abnormal patellar mobility.    Skin:    General: Skin is warm and dry.     Findings: No rash.  Neurological:     Mental Status: He is alert.       DG Lumbar Spine Complete CLINICAL DATA:  Chronic lower back pain  EXAM: LUMBAR SPINE - COMPLETE 4+ VIEW  COMPARISON:  None  FINDINGS: Hypoplastic last rib pair.  Five non-rib-bearing lumbar vertebra.  Broad-based levoconvex lumbar scoliosis.  Disc space narrowing L4-L5 with small superior endplate spur L5.  Vertebral body and disc space heights otherwise maintained.  No fracture, subluxation, bone destruction or spondylolysis.  SI joints preserved.  IMPRESSION: Mild levoconvex scoliosis lumbar spine.  Degenerative disc disease changes L4-L5, mild.  No acute abnormalities.  Electronically Signed   By: Ulyses Southward M.D.   On: 01/28/2020 11:00 DG Knee 4 Views W/Patella Left CLINICAL DATA:  Anterior knee pain LEFT greater than RIGHT  EXAM: RIGHT KNEE - COMPLETE 4+ VIEW; LEFT KNEE - COMPLETE 4+ VIEW  COMPARISON:  None  FINDINGS: RIGHT knee:  Osseous mineralization normal.  Mild joint space narrowing at medial compartment.  No acute fracture, dislocation, or bone destruction.  Articular surface irregularity at the mid patella on lateral view.  No joint effusion.  LEFT knee:  Osseous mineralization normal.  Mild medial compartment joint space narrowing.  No fracture, dislocation, or bone destruction.  IMPRESSION: Mild  degenerative changes of both knees.  No acute abnormalities.  Electronically Signed   By: Ulyses Southward M.D.   On: 01/28/2020 10:59 DG Knee 4 Views W/Patella Right CLINICAL DATA:  Anterior knee pain LEFT greater than RIGHT  EXAM: RIGHT KNEE - COMPLETE 4+ VIEW; LEFT KNEE - COMPLETE 4+ VIEW  COMPARISON:  None  FINDINGS: RIGHT knee:  Osseous mineralization normal.  Mild joint space narrowing at medial compartment.  No acute fracture, dislocation, or bone destruction.  Articular surface irregularity at the mid patella on lateral view.  No joint effusion.  LEFT knee:  Osseous mineralization normal.  Mild medial compartment joint space narrowing.  No fracture, dislocation, or bone destruction.  IMPRESSION: Mild degenerative changes of both knees.  No acute abnormalities.  Electronically Signed   By: Ulyses Southward M.D.   On: 01/28/2020 10:59  Assessment & Plan:  This visit occurred during the SARS-CoV-2 public health emergency.  Safety protocols were in place, including screening questions prior to the visit, additional usage of staff PPE, and extensive cleaning of exam room while observing appropriate contact time as indicated for disinfecting solutions.   Problem List Items Addressed This Visit    Severe obesity (BMI 35.0-35.9 with comorbidity) (HCC)    Encouraged ongoing weight loss efforts which should improve joint pains.       Right inguinal hernia    Appreciate gen surg care - pending CT to further evaluate this.       Nasal congestion    Ongoing for a week, in setting of recent weather changes - anticipate allergic. No signs of bacterial sinusitis. rec trial flonase and nasal saline irrigation.       Lower back pain    Chronic, longstanding, anticipate related to DDD. Check baseline lumbar films.       Relevant Medications   acetaminophen (TYLENOL) 500 MG tablet   Other Relevant Orders   DG Lumbar Spine Complete (Completed)   Iron deficiency anemia     ?hemorrhoidal vs cameron ulcers (surgery suspects this) - pending upper GI for further evaluation.  Recommend back off oral ibuprofen due to concern over IDA.       Bilateral knee pain - Primary    Longstanding. Did not improve with trial of vit D or glucosamine and he noted side effects to both - will trial turmeric, will back off oral NSAID given concern for upper GI source for chronic blood loss, ok to try topical voltaren. May increase tylenol to 1000mg  TID. Continue robaxin, tramadol PRN. Will check imaging given longevity of symptoms. Update with effect.       Relevant Orders   DG Knee 4 Views W/Patella Right (Completed)   DG Knee 4 Views W/Patella Left (Completed)       Meds ordered this encounter  Medications  . acetaminophen (TYLENOL) 500 MG tablet    Sig: Take 2 tablets (1,000 mg total) by mouth 3 (three) times daily as needed for moderate pain.  diclofenac Sodium (VOLTAREN) 1 % GEL    Sig: Apply 2 g topically 3 (three) times daily.    Dispense:  100 g    Refill:  3   Orders Placed This Encounter  Procedures  . DG Knee 4 Views W/Patella Right    Standing Status:   Future    Number of Occurrences:   1    Standing Expiration Date:   03/29/2021    Order Specific Question:   Reason for Exam (SYMPTOM  OR DIAGNOSIS REQUIRED)    Answer:   anterior L>R knee pain    Order Specific Question:   Preferred imaging location?    Answer:   05/29/2021    Order Specific Question:   Radiology Contrast Protocol - do NOT remove file path    Answer:   \\[email or web address].[email or web address]  . DG Knee 4 Views W/Patella Left    Standing Status:   Future    Number of Occurrences:   1    Standing Expiration Date:   03/29/2021    Order Specific Question:   Reason for Exam (SYMPTOM  OR DIAGNOSIS REQUIRED)    Answer:   anterior L>R knee pain    Order Specific Question:   Preferred imaging location?    Answer:   05/29/2021    Order Specific Question:    Radiology Contrast Protocol -  do NOT remove file path    Answer:   \\[email or web address]s.[email or web address]  . DG Lumbar Spine Complete    Standing Status:   Future    Number of Occurrences:   1    Standing Expiration Date:   03/29/2021    Order Specific Question:   Reason for Exam (SYMPTOM  OR DIAGNOSIS REQUIRED)    Answer:   chronic lower back pain    Order Specific Question:   Preferred imaging location?    Answer:   [facility]    Order Specific Question:   Radiology Contrast Protocol - do NOT remove file path    Answer:   \\[email or web address]s.[email or web address]    Patient Instructions  Try turmeric for joints.  Try backing off ibuprofen for GI health.  Use voltaren gel over the counter (anti inflammatory topically) for knees. Continue tylenol 1000mg  up to 3 times a day for joint pain. Ok to continue robaxin and tramadol.  xrays of lower back and bilateral knees today.  Start flonase for allergic symptoms.   Follow up plan: Return in about 6 weeks (around 03/10/2020) for follow up visit.  Ria Bush, MD

## 2020-01-28 NOTE — Patient Instructions (Addendum)
Try turmeric for joints.  Try backing off ibuprofen for GI health.  Use voltaren gel over the counter (anti inflammatory topically) for knees. Continue tylenol 1000mg  up to 3 times a day for joint pain. Ok to continue robaxin and tramadol.  xrays of lower back and bilateral knees today.  Start flonase for allergic symptoms.

## 2020-01-31 ENCOUNTER — Other Ambulatory Visit: Payer: Managed Care, Other (non HMO)

## 2020-02-03 ENCOUNTER — Encounter: Payer: Self-pay | Admitting: Family Medicine

## 2020-02-03 MED ORDER — AMOXICILLIN-POT CLAVULANATE 875-125 MG PO TABS: 1.0000 | ORAL_TABLET | Freq: Two times a day (BID) | ORAL | 0 refills | Status: AC

## 2020-02-05 ENCOUNTER — Encounter: Payer: Self-pay | Admitting: Family Medicine

## 2020-02-05 DIAGNOSIS — R0981 Nasal congestion: Secondary | ICD-10-CM | POA: Insufficient documentation

## 2020-02-05 NOTE — Assessment & Plan Note (Addendum)
Longstanding. Anticipate combination of OA and possible patellofemoral pain syndrome. Did not improve with trial of vit D or glucosamine and he noted side effects to both - will trial turmeric, will back off oral NSAID given concern for upper GI source for chronic blood loss, ok to try topical voltaren. May increase tylenol to 1000mg  TID. Continue robaxin, tramadol PRN. Will check imaging given longevity of symptoms. Update with effect.

## 2020-02-05 NOTE — Assessment & Plan Note (Signed)
Appreciate gen surg care - pending CT to further evaluate this.

## 2020-02-05 NOTE — Assessment & Plan Note (Addendum)
?  hemorrhoidal vs cameron ulcers (surgery suspects this) - pending upper GI for further evaluation.  Recommend back off oral ibuprofen due to concern over IDA.

## 2020-02-05 NOTE — Assessment & Plan Note (Signed)
Ongoing for a week, in setting of recent weather changes - anticipate allergic. No signs of bacterial sinusitis. rec trial flonase and nasal saline irrigation.

## 2020-02-05 NOTE — Assessment & Plan Note (Signed)
Chronic, longstanding, anticipate related to DDD. Check baseline lumbar films.

## 2020-02-05 NOTE — Assessment & Plan Note (Signed)
Encouraged ongoing weight loss efforts which should improve joint pains.

## 2020-02-14 ENCOUNTER — Other Ambulatory Visit: Payer: Self-pay | Admitting: Surgery

## 2020-02-14 DIAGNOSIS — K4091 Unilateral inguinal hernia, without obstruction or gangrene, recurrent: Secondary | ICD-10-CM

## 2020-03-15 ENCOUNTER — Other Ambulatory Visit: Payer: Self-pay | Admitting: Family Medicine

## 2020-03-16 MED ORDER — TRAMADOL HCL 50 MG PO TABS: 50.0000 mg | ORAL_TABLET | Freq: Two times a day (BID) | ORAL | 0 refills | Status: DC | PRN

## 2020-03-16 NOTE — Telephone Encounter (Signed)
ERx 

## 2020-03-16 NOTE — Telephone Encounter (Signed)
Name of Medication: Tramadol Name of Pharmacy: [facility] Last Fill or Written Date and Quantity: 01/18/20, #50 Last Office Visit and Type: 01/28/20, chronic knee/back pain Next Office Visit and Type: 04/28/20, pain & hernia f/u Last Controlled Substance Agreement Date: none Last UDS: none

## 2020-04-12 ENCOUNTER — Other Ambulatory Visit: Payer: Self-pay | Admitting: Surgery

## 2020-04-12 DIAGNOSIS — K4091 Unilateral inguinal hernia, without obstruction or gangrene, recurrent: Secondary | ICD-10-CM

## 2020-04-14 ENCOUNTER — Other Ambulatory Visit: Payer: Self-pay

## 2020-04-14 ENCOUNTER — Ambulatory Visit
Admission: RE | Admit: 2020-04-14 | Discharge: 2020-04-14 | Disposition: A | Discharge status: Home / Self Care | Payer: Managed Care, Other (non HMO) | Source: Ambulatory Visit | Attending: Surgery | Admitting: Surgery

## 2020-04-14 DIAGNOSIS — K4091 Unilateral inguinal hernia, without obstruction or gangrene, recurrent: Secondary | ICD-10-CM

## 2020-04-14 MED ORDER — IOPAMIDOL (ISOVUE-300) INJECTION 61%
125.0000 mL | Freq: Once | INTRAVENOUS | Status: AC | PRN
  Administered 2020-04-14: 125 mL via INTRAVENOUS

## 2020-04-28 ENCOUNTER — Encounter: Payer: Self-pay | Admitting: Family Medicine

## 2020-04-28 ENCOUNTER — Ambulatory Visit: Payer: Managed Care, Other (non HMO) | Admitting: Family Medicine

## 2020-04-28 ENCOUNTER — Other Ambulatory Visit: Payer: Self-pay

## 2020-04-28 ENCOUNTER — Ambulatory Visit (INDEPENDENT_AMBULATORY_CARE_PROVIDER_SITE_OTHER): Payer: Managed Care, Other (non HMO) | Admitting: Family Medicine

## 2020-04-28 VITALS — BP 118/84 | HR 96 | Temp 97.5°F | Ht 67.0 in | Wt 240.0 lb

## 2020-04-28 DIAGNOSIS — R935 Abnormal findings on diagnostic imaging of other abdominal regions, including retroperitoneum: Secondary | ICD-10-CM | POA: Insufficient documentation

## 2020-04-28 DIAGNOSIS — K648 Other hemorrhoids: Secondary | ICD-10-CM

## 2020-04-28 DIAGNOSIS — K219 Gastro-esophageal reflux disease without esophagitis: Secondary | ICD-10-CM

## 2020-04-28 DIAGNOSIS — M25561 Pain in right knee: Secondary | ICD-10-CM

## 2020-04-28 DIAGNOSIS — I10 Essential (primary) hypertension: Secondary | ICD-10-CM

## 2020-04-28 DIAGNOSIS — K4090 Unilateral inguinal hernia, without obstruction or gangrene, not specified as recurrent: Secondary | ICD-10-CM

## 2020-04-28 DIAGNOSIS — K760 Fatty (change of) liver, not elsewhere classified: Secondary | ICD-10-CM | POA: Diagnosis not present

## 2020-04-28 DIAGNOSIS — D509 Iron deficiency anemia, unspecified: Secondary | ICD-10-CM | POA: Diagnosis not present

## 2020-04-28 DIAGNOSIS — G8929 Other chronic pain: Secondary | ICD-10-CM

## 2020-04-28 DIAGNOSIS — M25562 Pain in left knee: Secondary | ICD-10-CM

## 2020-04-28 DIAGNOSIS — M545 Low back pain: Secondary | ICD-10-CM

## 2020-04-28 MED ORDER — TURMERIC 500 MG PO CAPS: 1.0000 | ORAL_CAPSULE | Freq: Two times a day (BID) | ORAL | Status: DC

## 2020-04-28 MED ORDER — TRAMADOL HCL 50 MG PO TABS: 50.0000 mg | ORAL_TABLET | Freq: Two times a day (BID) | ORAL | 0 refills | Status: DC | PRN

## 2020-04-28 MED ORDER — METHOCARBAMOL 500 MG PO TABS: 500.0000 mg | ORAL_TABLET | Freq: Two times a day (BID) | ORAL | 3 refills | Status: DC | PRN

## 2020-04-28 MED ORDER — TURMERIC 500 MG PO CAPS: 1.0000 | ORAL_CAPSULE | Freq: Every day | ORAL | Status: DC

## 2020-04-28 NOTE — Assessment & Plan Note (Signed)
Recent CT reviewed - he will follow up with gen surgery.

## 2020-04-28 NOTE — Assessment & Plan Note (Signed)
Hemorrhoidal vs cameron ulcers - pt states insurance would not cover EGD. Encouraged GI and gen surgery f/u. He continues ferrous sulfate 325mg  daily with breakfast.

## 2020-04-28 NOTE — Progress Notes (Signed)
This visit was conducted in person.  BP 118/84   Pulse 96   Temp (!) 97.5 F (36.4 C)   Ht 5\' 7"  (1.702 m)   Wt 240 lb (108.9 kg)   SpO2 99%   BMI 37.59 kg/m    CC: 3 mo f/u visit  Subjective:    Patient ID: [name] [name], male    DOB: [birth year], 54 y.o.   MRN: [id number]  HPI: [name] [name] is a 54 y.o. male presenting on 04/28/2020 for Pain   Ongoing bilateral anterior knee pain worse with stairs and after prolonged sitting presumed due to knee osteoarthritis by xrays, as well as chronic lower back pain and stiffness with signs of mild DDD  L4/5 by xray - manages with tramadol, muscle relaxant, ibuprofen 400mg  bid and tylenol 1000mg  bid. Last visit we decreased ibuprofen due to chronic IDA.   Glucosamine trial did not help joint pains and caused constipation.  Vitamin D 2000 IU daily did not help joint pains and may have caused nausea  Last visit we did trial of turmeric and topical voltaren gel in place of oral NSAIDs - he's taking 1 teaspoon of turmeric daily. Discussed possible capsule use.   Requests tramadol, robaxin refill - notes worsening joint pains at night affecting sleep. Ongoing lower back pain with radiation to shoulder blades.   GERD managed with pepcid 20mg  nightly.   Requests updated FMLA as well as powered heating wrap with massage, brings info of device which he uses at home with benefit, would like to use this at work.      Relevant past medical, surgical, family and social history reviewed and updated as indicated. Interim medical history since our last visit reviewed. Allergies and medications reviewed and updated. Outpatient Medications Prior to Visit  Medication Sig Dispense Refill  . acetaminophen (TYLENOL) 500 MG tablet Take 2 tablets (1,000 mg total) by mouth 3 (three) times daily as needed for moderate pain.    06/28/2020 amLODipine (NORVASC) 5 MG tablet Take 1 tablet (5 mg total) by mouth daily. 90 tablet 3  . diclofenac Sodium (VOLTAREN) 1 % GEL Apply 2 g  topically 3 (three) times daily. 100 g 3  . docusate sodium (COLACE) 100 MG capsule Take 1 capsule (100 mg total) by mouth daily.    . famotidine (PEPCID) 20 MG tablet Take 20 mg by mouth daily.    . ferrous sulfate 325 (65 FE) MG tablet Take 325 mg by mouth daily with breakfast.    . hydrochlorothiazide (HYDRODIURIL) 25 MG tablet TAKE ONE TABLET BY MOUTH EVERY DAY 90 tablet 3  . hydrocortisone (ANUSOL-HC) 25 MG suppository Place 1 suppository (25 mg total) rectally 2 (two) times daily. 12 suppository 0  . hydrOXYzine (ATARAX/VISTARIL) 25 MG tablet Take 0.5-1 tablets (12.5-25 mg total) by mouth 2 (two) times daily as needed for anxiety (sedation precautions). 30 tablet 1  . ibuprofen (ADVIL,MOTRIN) 200 MG tablet Take 200 mg by mouth every 6 (six) hours as needed.    . methocarbamol (ROBAXIN) 500 MG tablet Take 1 tablet (500 mg total) by mouth 2 (two) times daily as needed for muscle spasms (sedation precautions). 30 tablet 0  . traMADol (ULTRAM) 50 MG tablet Take 1 tablet (50 mg total) by mouth every 12 (twelve) hours as needed for moderate pain. 50 tablet 0   No facility-administered medications prior to visit.     Per HPI unless specifically indicated in ROS section below Review of Systems Objective:  BP 118/84  Pulse 96   Temp (!) 97.5 F (36.4 C)   Ht 5\' 7"  (1.702 m)   Wt 240 lb (108.9 kg)   SpO2 99%   BMI 37.59 kg/m   Wt Readings from Last 3 Encounters:  04/28/20 240 lb (108.9 kg)  01/28/20 237 lb 6 oz (107.7 kg)  11/22/19 238 lb 9.6 oz (108.2 kg)      Physical Exam Vitals and nursing note reviewed.  Constitutional:      Appearance: Normal appearance. He is obese. He is not ill-appearing.  Cardiovascular:     Rate and Rhythm: Normal rate and regular rhythm.     Pulses: Normal pulses.     Heart sounds: Normal heart sounds. No murmur heard.   Pulmonary:     Effort: Pulmonary effort is normal. No respiratory distress.     Breath sounds: Normal breath sounds. No wheezing,  rhonchi or rales.  Musculoskeletal:     Right lower leg: No edema.     Left lower leg: No edema.  Neurological:     Mental Status: He is alert.  Psychiatric:        Mood and Affect: Mood normal.        Behavior: Behavior normal.       Results for orders placed or performed in visit on 12/03/18  CBC with Differential/Platelet  Result Value Ref Range   WBC 6.5 3.4 - 10.8 x10E3/uL   RBC 4.74 4.14 - 5.80 x10E6/uL   Hemoglobin 11.7 (L) 13.0 - 17.7 g/dL   Hematocrit 36.5 (L) 37.5 - 51.0 %   MCV 77 (L) 79 - 97 fL   MCH 24.7 (L) 26.6 - 33.0 pg   MCHC 32.1 31 - 35 g/dL   RDW 14.8 11.6 - 15.4 %   Platelets 299 150 - 450 x10E3/uL   Neutrophils 71 Not Estab. %   Lymphs 17 Not Estab. %   Monocytes 9 Not Estab. %   Eos 1 Not Estab. %   Basos 1 Not Estab. %   Neutrophils Absolute 4.7 1 - 7 x10E3/uL   Lymphocytes Absolute 1.1 0 - 3 x10E3/uL   Monocytes Absolute 0.6 0 - 0 x10E3/uL   EOS (ABSOLUTE) 0.1 0.0 - 0.4 x10E3/uL   Basophils Absolute 0.0 0 - 0 x10E3/uL   Immature Granulocytes 1 Not Estab. %   Immature Grans (Abs) 0.0 0.0 - 0.1 x10E3/uL  Comprehensive metabolic panel  Result Value Ref Range   Glucose 90 65 - 99 mg/dL   BUN 13 6 - 24 mg/dL   Creatinine, Ser 1.20 0.76 - 1.27 mg/dL   GFR calc non Af Amer 69 >59 mL/min/1.73   GFR calc Af Amer 79 >59 mL/min/1.73   BUN/Creatinine Ratio 11 9 - 20   Sodium 137 134 - 144 mmol/L   Potassium 4.1 3.5 - 5.2 mmol/L   Chloride 98 96 - 106 mmol/L   CO2 23 20 - 29 mmol/L   Calcium 9.4 8.7 - 10.2 mg/dL   Total Protein 7.2 6.0 - 8.5 g/dL   Albumin 4.5 3.5 - 5.5 g/dL   Globulin, Total 2.7 1.5 - 4.5 g/dL   Albumin/Globulin Ratio 1.7 1.2 - 2.2   Bilirubin Total 0.9 0.0 - 1.2 mg/dL   Alkaline Phosphatase 42 39 - 117 IU/L   AST 13 0 - 40 IU/L   ALT 21 0 - 44 IU/L  Lipid Panel w/o Chol/HDL Ratio  Result Value Ref Range   Cholesterol, Total 246 (H) 100 - 199 mg/dL  Triglycerides 130 0 - 149 mg/dL   HDL 48 >25 mg/dL   VLDL Cholesterol Cal 26  5 - 40 mg/dL   LDL Calculated 638 (H) 0 - 99 mg/dL  Iron and TIBC  Result Value Ref Range   Total Iron Binding Capacity 371 250 - 450 ug/dL   UIBC 937 342 - 876 ug/dL   Iron 811 38 - 572 ug/dL   Iron Saturation 42 15 - 55 %  PSA  Result Value Ref Range   Prostate Specific Ag, Serum 1.7 0.0 - 4.0 ng/mL  Microalbumin, urine  Result Value Ref Range   Microalbumin, Urine 3.6 Not Estab. ug/mL  Ferritin  Result Value Ref Range   Ferritin 20 (L) 30.0 - 400.0 ng/mL   Assessment & Plan:  This visit occurred during the SARS-CoV-2 public health emergency.  Safety protocols were in place, including screening questions prior to the visit, additional usage of staff PPE, and extensive cleaning of exam room while observing appropriate contact time as indicated for disinfecting solutions.  [place] CSRS reviewed.  Problem List Items Addressed This Visit    Right inguinal hernia    Recent CT reviewed - he will follow up with gen surgery.       Lower back pain - Primary    xrays with evidence of mild DDD L4/5.  Continue turmeric, tramadol, roabxin and tylenol.  He uses heating warp with massage brace at home with benefit - requests letter to be able to use this at work. Letter will be written and sent through mychart. Also requests updated FMLA forms.       Relevant Medications   traMADol (ULTRAM) 50 MG tablet   methocarbamol (ROBAXIN) 500 MG tablet   Iron deficiency anemia    Hemorrhoidal vs cameron ulcers - pt states insurance would not cover EGD. Encouraged GI and gen surgery f/u. He continues ferrous sulfate 325mg  daily with breakfast.       Internal hemorrhoids    Likely current source of bleed - encouraged GI f/u - to consider banding. He states he has ongoing rectal bleeding several times a week.       HTN (hypertension)    Chronic, stable. Continue current regimen.       Hepatic steatosis    Reviewed incidental finding on recent CT      GERD (gastroesophageal reflux disease)     Continue pepcid daily.       Relevant Medications   famotidine (PEPCID) 20 MG tablet   Bilateral knee pain    Ongoing - thought OA related.  Continues turmeric 500mg  daily tylenol and voltaren gel - will refill tramadol as well.       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           Meds ordered this encounter  Medications  . DISCONTD: Turmeric 500 MG CAPS    Sig: Take 1 capsule by mouth in the morning and at bedtime.  . traMADol (ULTRAM) 50 MG tablet    Sig: Take 1 tablet (50 mg total) by mouth every 12 (twelve) hours as needed for moderate pain.    Dispense:  50 tablet    Refill:  0  . methocarbamol (ROBAXIN) 500 MG tablet    Sig: Take 1 tablet (500 mg total) by mouth 2 (two) times daily as needed for muscle spasms (sedation precautions).    Dispense:  30 tablet    Refill:  3  . Turmeric 500 MG CAPS    Sig: Take 1 capsule by mouth daily.   No orders of the defined types were placed in this encounter.   Patient Instructions  Call surgery if you haven't heard from them by next week.  Call GI to touch base about hemorrhoids.  Stay off ibuprofen, continue other medicines. I've refilled robaxin and tramadol for you today. We will work on forms for work.    Follow up plan: Return in about 6 months (around 10/28/2020) for annual exam, prior fasting for blood work.  Eustaquio Boyden, MD

## 2020-04-28 NOTE — Assessment & Plan Note (Signed)
Continue pepcid daily  

## 2020-04-28 NOTE — Assessment & Plan Note (Signed)
Reviewed incidental finding on recent CT

## 2020-04-28 NOTE — Assessment & Plan Note (Signed)
Chronic, stable. Continue current regimen. 

## 2020-04-28 NOTE — Assessment & Plan Note (Addendum)
xrays with evidence of mild DDD L4/5.  Continue turmeric, tramadol, roabxin and tylenol.  He uses heating warp with massage brace at home with benefit - requests letter to be able to use this at work. Letter will be written and sent through mychart. Also requests updated FMLA forms.

## 2020-04-28 NOTE — Assessment & Plan Note (Signed)
Likely current source of bleed - encouraged GI f/u - to consider banding. He states he has ongoing rectal bleeding several times a week.

## 2020-04-28 NOTE — Assessment & Plan Note (Addendum)
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

## 2020-04-28 NOTE — Assessment & Plan Note (Addendum)
Ongoing - thought OA related.  Continues turmeric 500mg  daily tylenol and voltaren gel - will refill tramadol as well.

## 2020-04-28 NOTE — Patient Instructions (Addendum)
Call surgery if you haven't heard from them by next week.  Call GI to touch base about hemorrhoids.  Stay off ibuprofen, continue other medicines. I've refilled robaxin and tramadol for you today. We will work on forms for work.

## 2020-05-02 ENCOUNTER — Encounter: Payer: Self-pay | Admitting: Family Medicine

## 2020-05-02 NOTE — Telephone Encounter (Signed)
New letter sent off.

## 2020-05-14 ENCOUNTER — Encounter: Payer: Self-pay | Admitting: Family Medicine

## 2020-05-15 ENCOUNTER — Encounter: Payer: Self-pay | Admitting: Family Medicine

## 2020-05-15 NOTE — Telephone Encounter (Signed)
See Pt Msg, 6/28/221.

## 2020-06-12 ENCOUNTER — Other Ambulatory Visit: Payer: Self-pay | Admitting: Family Medicine

## 2020-06-13 MED ORDER — TRAMADOL HCL 50 MG PO TABS: 50.0000 mg | ORAL_TABLET | Freq: Two times a day (BID) | ORAL | 0 refills | Status: DC | PRN

## 2020-06-13 NOTE — Telephone Encounter (Signed)
ERx 

## 2020-06-13 NOTE — Telephone Encounter (Signed)
LAST APPOINTMENT DATE: 04/28/2020   NEXT APPOINTMENT DATE: 08/04/2020    LAST REFILL: 04/28/2020  QTY: #50 no refills

## 2020-07-15 ENCOUNTER — Other Ambulatory Visit: Payer: Self-pay | Admitting: Family Medicine

## 2020-07-17 MED ORDER — TRAMADOL HCL 50 MG PO TABS: 50.0000 mg | ORAL_TABLET | Freq: Two times a day (BID) | ORAL | 0 refills | Status: DC | PRN

## 2020-07-17 NOTE — Telephone Encounter (Signed)
ERx 

## 2020-07-17 NOTE — Telephone Encounter (Signed)
Last seen 06/11/201, last filled 06/13/2020

## 2020-08-03 ENCOUNTER — Telehealth: Payer: Self-pay

## 2020-08-03 NOTE — Telephone Encounter (Signed)
Called patient and canceled.

## 2020-08-03 NOTE — Telephone Encounter (Signed)
[facility] Night - Client Nonclinical Telephone Record AccessNurse Client [place] [facility] Night - Client Client Site  [facility] [place] - Night Physician Eustaquio Boyden - MD Contact Type Call Who Is Calling Patient / Member / Family / Caregiver Caller Name Derreon Consalvo Caller Phone Number [phone] Patient Name [name] [name] Patient DOB 1966-04-08 Call Type Message Only Information Provided Reason for Call Request to [facility] Appointment Initial Comment Caller states that he would like to cancel an appointment that is tomorrow at 10AM. Additional Comment Caller declined triage at this time but just needed a message to be sent to cancel his appointment that is scheduled for tomorrow. Caller stated that he will call back at a later time to reschedule the appointment. Disp. Time Disposition Final User 08/03/2020 7:39:00 AM General Information Provided Yes Cherylynn Ridges Call Closed By: Cherylynn Ridges Transaction Date/Time: 08/03/2020 7:36:41 AM (ET)

## 2020-08-04 ENCOUNTER — Ambulatory Visit: Payer: Managed Care, Other (non HMO) | Admitting: Family Medicine

## 2020-08-11 NOTE — Patient Instructions (Addendum)
DUE TO COVID-19 ONLY ONE VISITOR IS ALLOWED TO COME WITH YOU AND STAY IN THE WAITING ROOM ONLY DURING PRE OP AND PROCEDURE DAY OF SURGERY. THE 1 VISITOR  MAY VISIT WITH YOU AFTER SURGERY IN YOUR PRIVATE ROOM DURING VISITING HOURS ONLY!  YOU NEED TO HAVE A COVID 19 TEST ON_10/5______ @_______ , THIS TEST MUST BE DONE BEFORE SURGERY,  COVID TESTING SITE [address] [place] , IT IS ON THE RIGHT GOING OUT [address] APPROXIMATELY  2 MINUTES PAST [employer] ON THE RIGHT. ONCE YOUR COVID TEST IS COMPLETED,  PLEASE BEGIN THE QUARANTINE INSTRUCTIONS AS OUTLINED IN YOUR HANDOUT.                [id number]    Your procedure is scheduled on: 08/25/20   Report to [facility] Main  Entrance   Report to Short stay at 5:30 AM     Call this number if you have problems the morning of surgery [phone]    Remember: Do not eat food or drink liquids :After Midnight.  Follow any instructions from Dr. 10-28-1998 office.   BRUSH YOUR TEETH MORNING OF SURGERY AND RINSE YOUR MOUTH OUT, NO CHEWING GUM CANDY OR MINTS.     Take these medicines the morning of surgery with A SIP OF WATER: Amlodipine, Pepcid, Hydroxyzine if needed                                  You may not have any metal on your body including              piercings  Do not wear jewelry, lotions, powders or deodorant                          Men may shave face and neck.   Do not bring valuables to the hospital. [place] IS NOT             RESPONSIBLE   FOR VALUABLES.  Contacts, dentures or bridgework may not be worn into surgery.       Patients discharged the day of surgery will not be allowed to drive home.   IF YOU ARE HAVING SURGERY AND GOING HOME THE SAME DAY, YOU MUST HAVE AN ADULT TO DRIVE YOU HOME AND BE WITH YOU FOR 24 HOURS.   YOU MAY GO HOME BY TAXI OR UBER OR ORTHERWISE, BUT AN ADULT MUST ACCOMPANY YOU HOME AND STAY WITH YOU FOR 24 HOURS.  Name and phone number of your  driver:  Special Instructions: N/A              Please read over the following fact sheets you were given: _____________________________________________________________________             [facility] - Preparing for Surgery  Before surgery, you can play an important role.   Because skin is not sterile, your skin needs to be as free of germs as possible.   You can reduce the number of germs on your skin by washing with CHG (chlorahexidine gluconate) soap before surgery.   CHG is an antiseptic cleaner which kills germs and bonds with the skin to continue killing germs even after washing. Please DO NOT use if you have an allergy to CHG or antibacterial soaps.   If your skin becomes reddened/irritated stop using the CHG and inform your nurse when  you arrive at Short Stay. .  You may shave your face/neck.  Please follow these instructions carefully:  1.  Shower with CHG Soap the night before surgery and the  morning of Surgery.  2.  If you choose to wash your hair, wash your hair first as usual with your  normal  shampoo.  3.  After you shampoo, rinse your hair and body thoroughly to remove the  shampoo.                                        4.  Use CHG as you would any other liquid soap.  You can apply chg directly  to the skin and wash                       Gently with a scrungie or clean washcloth.  5.  Apply the CHG Soap to your body ONLY FROM THE NECK DOWN.   Do not use on face/ open                           Wound or open sores. Avoid contact with eyes, ears mouth and genitals (private parts).                       Wash face,  Genitals (private parts) with your normal soap.             6.  Wash thoroughly, paying special attention to the area where your surgery  will be performed.  7.  Thoroughly rinse your body with warm water from the neck down.  8.  DO NOT shower/wash with your normal soap after using and rinsing off  the CHG Soap.             9.  Pat yourself dry with a clean  towel.            10.  Wear clean pajamas.            11.  Place clean sheets on your bed the night of your first shower and do not  sleep with pets. Day of Surgery : Do not apply any lotions/deodorants the morning of surgery.  Please wear clean clothes to the hospital/surgery center.  FAILURE TO FOLLOW THESE INSTRUCTIONS MAY RESULT IN THE CANCELLATION OF YOUR SURGERY PATIENT SIGNATURE_________________________________  NURSE SIGNATURE__________________________________  ________________________________________________________________________

## 2020-08-15 ENCOUNTER — Encounter (HOSPITAL_COMMUNITY)
Admission: RE | Admit: 2020-08-15 | Discharge: 2020-08-15 | Disposition: A | Discharge status: Home / Self Care | Payer: Managed Care, Other (non HMO) | Source: Ambulatory Visit | Attending: Surgery | Admitting: Surgery

## 2020-08-15 NOTE — Progress Notes (Signed)
Pt did not come to his PAT appointment . I called home and cell number and left a message.

## 2020-08-19 ENCOUNTER — Other Ambulatory Visit: Payer: Self-pay | Admitting: Family Medicine

## 2020-08-21 NOTE — Telephone Encounter (Signed)
Name of Medication: Tramadol Name of Pharmacy: [facility] Last Fill or Written Date and Quantity: 07/17/20, #50 Last Office Visit and Type: 04/28/20, chronic pain f/u Next Office Visit and Type: none Last Controlled Substance Agreement Date: none Last UDS: none

## 2020-08-22 MED ORDER — TRAMADOL HCL 50 MG PO TABS: 50.0000 mg | ORAL_TABLET | Freq: Two times a day (BID) | ORAL | 0 refills | Status: DC | PRN

## 2020-08-22 NOTE — Telephone Encounter (Signed)
ERx 

## 2020-08-23 ENCOUNTER — Other Ambulatory Visit: Admission: RE | Admit: 2020-08-23 | Payer: Managed Care, Other (non HMO) | Source: Ambulatory Visit

## 2020-08-25 ENCOUNTER — Ambulatory Visit (HOSPITAL_COMMUNITY): Admission: RE | Admit: 2020-08-25 | Payer: Managed Care, Other (non HMO) | Source: Home / Self Care | Admitting: Surgery

## 2020-08-25 ENCOUNTER — Encounter (HOSPITAL_COMMUNITY): Admission: RE | Payer: Self-pay | Source: Home / Self Care

## 2020-08-25 SURGERY — REPAIR, HERNIA, INGUINAL, ADULT
Anesthesia: General | Laterality: Right

## 2020-09-27 ENCOUNTER — Other Ambulatory Visit: Payer: Self-pay | Admitting: Family Medicine

## 2020-09-27 NOTE — Telephone Encounter (Signed)
Name of Medication: Tramadol Name of Pharmacy: [facility] Last Fill or Written Date and Quantity: 08/22/20, #50 Last Office Visit and Type: 04/28/20, 3 mo f/u Next Office Visit and Type: none Last Controlled Substance Agreement Date: none Last UDS: none

## 2020-09-28 MED ORDER — TRAMADOL HCL 50 MG PO TABS: 50.0000 mg | ORAL_TABLET | Freq: Two times a day (BID) | ORAL | 0 refills | Status: DC | PRN

## 2020-09-28 NOTE — Telephone Encounter (Signed)
ERx 

## 2020-10-19 ENCOUNTER — Telehealth: Payer: Self-pay | Admitting: Family Medicine

## 2020-10-19 NOTE — Telephone Encounter (Signed)
Patient wants to pick up his lab req so he can go to [facility] Please call patient to let know when it will be ready for pickup EM

## 2020-10-20 NOTE — Telephone Encounter (Signed)
Spoke with pt to get clarification of message.  States he wants have cpe (on 12/27/20) labs done with [facility].  He's requesting a written order be mailed to him to take to [facility].

## 2020-10-23 NOTE — Telephone Encounter (Signed)
Rx written and in Lisa's box.  

## 2020-10-23 NOTE — Telephone Encounter (Signed)
Mailed to pt

## 2020-11-02 ENCOUNTER — Telehealth: Payer: Self-pay | Admitting: Family Medicine

## 2020-11-02 ENCOUNTER — Other Ambulatory Visit: Payer: Self-pay | Admitting: Family Medicine

## 2020-11-02 NOTE — Telephone Encounter (Signed)
Pharmacy requests refill on: Tramadol  LAST REFILL: 09/28/2020 (Q-50, R-0) LAST OV: 04/28/2020 NEXT OV: 12/27/2020 PHARMACY: [id number] [place], [place]

## 2020-11-02 NOTE — Telephone Encounter (Signed)
Patient came into office and dropped off FMLA forms. Stated it is a repeat of June. Filled and placed in PCP's inbox for review and signature.

## 2020-11-03 MED ORDER — TRAMADOL HCL 50 MG PO TABS: 50.0000 mg | ORAL_TABLET | Freq: Two times a day (BID) | ORAL | 0 refills | Status: DC | PRN

## 2020-11-03 NOTE — Telephone Encounter (Signed)
ERx 

## 2020-11-12 ENCOUNTER — Encounter: Payer: Self-pay | Admitting: Family Medicine

## 2020-11-13 NOTE — Telephone Encounter (Signed)
Filled and in my outbox

## 2020-11-13 NOTE — Telephone Encounter (Signed)
Filled out

## 2020-11-14 NOTE — Telephone Encounter (Signed)
Paperwork faxed. Pt notified and copy ready for pick up.  Copy for scan Copy for patient Copy for billing

## 2020-12-07 ENCOUNTER — Other Ambulatory Visit: Payer: Self-pay | Admitting: Family Medicine

## 2020-12-07 NOTE — Telephone Encounter (Signed)
Pharmacy requests refill on: Tramadol 50 mg   LAST REFILL: 11/03/2020 (Q-50, R-0) LAST OV: 04/28/2020 NEXT OV: 12/27/2020 PHARMACY: [id number] [place], [place]

## 2020-12-08 MED ORDER — TRAMADOL HCL 50 MG PO TABS: 50.0000 mg | ORAL_TABLET | Freq: Two times a day (BID) | ORAL | 0 refills | Status: DC | PRN

## 2020-12-08 NOTE — Telephone Encounter (Signed)
ERx 

## 2020-12-19 ENCOUNTER — Other Ambulatory Visit: Payer: Self-pay | Admitting: Family Medicine

## 2020-12-20 LAB — COMPREHENSIVE METABOLIC PANEL
ALT: 17 IU/L (ref 0–44)
AST: 14 IU/L (ref 0–40)
Albumin/Globulin Ratio: 1.9 (ref 1.2–2.2)
Albumin: 4.3 g/dL (ref 3.8–4.9)
Alkaline Phosphatase: 45 IU/L (ref 44–121)
BUN/Creatinine Ratio: 10 (ref 9–20)
BUN: 13 mg/dL (ref 6–24)
Bilirubin Total: 0.7 mg/dL (ref 0.0–1.2)
CO2: 22 mmol/L (ref 20–29)
Calcium: 9.1 mg/dL (ref 8.7–10.2)
Chloride: 103 mmol/L (ref 96–106)
Creatinine, Ser: 1.36 mg/dL — ABNORMAL HIGH (ref 0.76–1.27)
GFR calc Af Amer: 67 mL/min/{1.73_m2} (ref >59)
GFR calc non Af Amer: 58 mL/min/{1.73_m2} — ABNORMAL LOW (ref >59)
Globulin, Total: 2.3 g/dL (ref 1.5–4.5)
Glucose: 100 mg/dL — ABNORMAL HIGH (ref 65–99)
Potassium: 4.4 mmol/L (ref 3.5–5.2)
Sodium: 140 mmol/L (ref 134–144)
Total Protein: 6.6 g/dL (ref 6.0–8.5)

## 2020-12-20 LAB — CBC WITH DIFFERENTIAL/PLATELET
Basophils Absolute: 0 10*3/uL (ref 0.0–0.2)
Basos: 1 %
EOS (ABSOLUTE): 0.1 10*3/uL (ref 0.0–0.4)
Eos: 2 %
Hematocrit: 34.7 % — ABNORMAL LOW (ref 37.5–51.0)
Hemoglobin: 10.7 g/dL — ABNORMAL LOW (ref 13.0–17.7)
Immature Grans (Abs): 0 10*3/uL (ref 0.0–0.1)
Immature Granulocytes: 0 %
Lymphocytes Absolute: 1.3 10*3/uL (ref 0.7–3.1)
Lymphs: 22 %
MCH: 23.3 pg — ABNORMAL LOW (ref 26.6–33.0)
MCHC: 30.8 g/dL — ABNORMAL LOW (ref 31.5–35.7)
MCV: 75 fL — ABNORMAL LOW (ref 79–97)
Monocytes Absolute: 0.6 10*3/uL (ref 0.1–0.9)
Monocytes: 11 %
Neutrophils Absolute: 3.7 10*3/uL (ref 1.4–7.0)
Neutrophils: 64 %
Platelets: 337 10*3/uL (ref 150–450)
RBC: 4.6 x10E6/uL (ref 4.14–5.80)
RDW: 15.8 % — ABNORMAL HIGH (ref 11.6–15.4)
WBC: 5.7 10*3/uL (ref 3.4–10.8)

## 2020-12-20 LAB — LIPID PANEL W/O CHOL/HDL RATIO
Cholesterol, Total: 213 mg/dL — ABNORMAL HIGH (ref 100–199)
HDL: 42 mg/dL (ref >39)
LDL Chol Calc (NIH): 152 mg/dL — ABNORMAL HIGH (ref 0–99)
Triglycerides: 103 mg/dL (ref 0–149)
VLDL Cholesterol Cal: 19 mg/dL (ref 5–40)

## 2020-12-20 LAB — IRON AND TIBC
Iron Saturation: 9 % — CL (ref 15–55)
Iron: 30 ug/dL — ABNORMAL LOW (ref 38–169)
Total Iron Binding Capacity: 334 ug/dL (ref 250–450)
UIBC: 304 ug/dL (ref 111–343)

## 2020-12-20 LAB — MICROALBUMIN / CREATININE URINE RATIO
Creatinine, Urine: 95.5 mg/dL
Microalb/Creat Ratio: 3 mg/g creat (ref 0–29)
Microalbumin, Urine: 3.3 ug/mL

## 2020-12-20 LAB — TSH: TSH: 1.15 u[IU]/mL (ref 0.450–4.500)

## 2020-12-20 LAB — PSA: Prostate Specific Ag, Serum: 1.6 ng/mL (ref 0.0–4.0)

## 2020-12-20 LAB — SPECIMEN STATUS REPORT

## 2020-12-21 LAB — SPECIMEN STATUS REPORT

## 2020-12-21 LAB — TRANSFERRIN: Transferrin: 318 mg/dL (ref 177–329)

## 2020-12-21 LAB — FERRITIN: Ferritin: 13 ng/mL — ABNORMAL LOW (ref 30–400)

## 2020-12-27 ENCOUNTER — Ambulatory Visit (INDEPENDENT_AMBULATORY_CARE_PROVIDER_SITE_OTHER): Payer: Managed Care, Other (non HMO) | Admitting: Family Medicine

## 2020-12-27 ENCOUNTER — Encounter: Payer: Self-pay | Admitting: Family Medicine

## 2020-12-27 ENCOUNTER — Other Ambulatory Visit: Payer: Self-pay

## 2020-12-27 VITALS — BP 148/98 | HR 95 | Temp 97.6°F | Ht 67.5 in | Wt 235.0 lb

## 2020-12-27 DIAGNOSIS — K4090 Unilateral inguinal hernia, without obstruction or gangrene, not specified as recurrent: Secondary | ICD-10-CM

## 2020-12-27 DIAGNOSIS — I10 Essential (primary) hypertension: Secondary | ICD-10-CM

## 2020-12-27 DIAGNOSIS — K219 Gastro-esophageal reflux disease without esophagitis: Secondary | ICD-10-CM

## 2020-12-27 DIAGNOSIS — R351 Nocturia: Secondary | ICD-10-CM

## 2020-12-27 DIAGNOSIS — N401 Enlarged prostate with lower urinary tract symptoms: Secondary | ICD-10-CM

## 2020-12-27 DIAGNOSIS — Z0001 Encounter for general adult medical examination with abnormal findings: Secondary | ICD-10-CM

## 2020-12-27 DIAGNOSIS — E785 Hyperlipidemia, unspecified: Secondary | ICD-10-CM

## 2020-12-27 DIAGNOSIS — D509 Iron deficiency anemia, unspecified: Secondary | ICD-10-CM | POA: Diagnosis not present

## 2020-12-27 DIAGNOSIS — Z6835 Body mass index (BMI) 35.0-35.9, adult: Secondary | ICD-10-CM

## 2020-12-27 MED ORDER — METHOCARBAMOL 500 MG PO TABS: 500.0000 mg | ORAL_TABLET | Freq: Two times a day (BID) | ORAL | 3 refills | Status: DC | PRN

## 2020-12-27 MED ORDER — AMLODIPINE BESYLATE 10 MG PO TABS
10.0000 mg | ORAL_TABLET | Freq: Every day | ORAL | 3 refills | Status: DC
Start: 2020-12-27 — End: 2022-02-11

## 2020-12-27 MED ORDER — HYDROXYZINE HCL 25 MG PO TABS: 12.5000 mg | ORAL_TABLET | Freq: Two times a day (BID) | ORAL | 1 refills | Status: DC | PRN

## 2020-12-27 MED ORDER — HYDROCHLOROTHIAZIDE 25 MG PO TABS: 25.0000 mg | ORAL_TABLET | Freq: Every day | ORAL | 3 refills | Status: DC

## 2020-12-27 NOTE — Assessment & Plan Note (Signed)
Continue pepcid daily  

## 2020-12-27 NOTE — Patient Instructions (Addendum)
Consider tetanus shot. Consider shingrix vaccine series. I do recommend COVID vaccines.  Ongoing iron deficiency anemia - consider return to GI to discuss further evaluation or scheduling iron infusion. Let me know when ready to proceed with either. BP staying elevated - increase amlodipine to 10mg  daily, watching for ankle swelling. Limit salt/sodium in the diet, continue good water intake.   Return in 6 months for follow up visit and 1 year for next physical.   Health Maintenance, Male Adopting a healthy lifestyle and getting preventive care are important in promoting health and wellness. Ask your health care provider about:  The right schedule for you to have regular tests and exams.  Things you can do on your own to prevent diseases and keep yourself healthy. What should I know about diet, weight, and exercise? Eat a healthy diet  Eat a diet that includes plenty of vegetables, fruits, low-fat dairy products, and lean protein.  Do not eat a lot of foods that are high in solid fats, added sugars, or sodium.   Maintain a healthy weight Body mass index (BMI) is a measurement that can be used to identify possible weight problems. It estimates body fat based on height and weight. Your health care provider can help determine your BMI and help you achieve or maintain a healthy weight. Get regular exercise Get regular exercise. This is one of the most important things you can do for your health. Most adults should:  Exercise for at least 150 minutes each week. The exercise should increase your heart rate and make you sweat (moderate-intensity exercise).  Do strengthening exercises at least twice a week. This is in addition to the moderate-intensity exercise.  Spend less time sitting. Even light physical activity can be beneficial. Watch cholesterol and blood lipids Have your blood tested for lipids and cholesterol at 56 years of age, then have this test every 5 years. You may need to have  your cholesterol levels checked more often if:  Your lipid or cholesterol levels are high.  You are older than 56 years of age.  You are at high risk for heart disease. What should I know about cancer screening? Many types of cancers can be detected early and may often be prevented. Depending on your health history and family history, you may need to have cancer screening at various ages. This may include screening for:  Colorectal cancer.  Prostate cancer.  Skin cancer.  Lung cancer. What should I know about heart disease, diabetes, and high blood pressure? Blood pressure and heart disease  High blood pressure causes heart disease and increases the risk of stroke. This is more likely to develop in people who have high blood pressure readings, are of African descent, or are overweight.  Talk with your health care provider about your target blood pressure readings.  Have your blood pressure checked: ? Every 3-5 years if you are 49-10 years of age. ? Every year if you are 90 years old or older.  If you are between the ages of 85 and 62 and are a current or former smoker, ask your health care provider if you should have a one-time screening for abdominal aortic aneurysm (AAA). Diabetes Have regular diabetes screenings. This checks your fasting blood sugar level. Have the screening done:  Once every three years after age 64 if you are at a normal weight and have a low risk for diabetes.  More often and at a younger age if you are overweight or have a high  risk for diabetes. What should I know about preventing infection? Hepatitis B If you have a higher risk for hepatitis B, you should be screened for this virus. Talk with your health care provider to find out if you are at risk for hepatitis B infection. Hepatitis C Blood testing is recommended for:  Everyone born from [birth year] through 1966.  Anyone with known risk factors for hepatitis C. Sexually transmitted infections  (STIs)  You should be screened each year for STIs, including gonorrhea and chlamydia, if: ? You are sexually active and are younger than 56 years of age. ? You are older than 56 years of age and your health care provider tells you that you are at risk for this type of infection. ? Your sexual activity has changed since you were last screened, and you are at increased risk for chlamydia or gonorrhea. Ask your health care provider if you are at risk.  Ask your health care provider about whether you are at high risk for HIV. Your health care provider may recommend a prescription medicine to help prevent HIV infection. If you choose to take medicine to prevent HIV, you should first get tested for HIV. You should then be tested every 3 months for as long as you are taking the medicine. Follow these instructions at home: Lifestyle  Do not use any products that contain nicotine or tobacco, such as cigarettes, e-cigarettes, and chewing tobacco. If you need help quitting, ask your health care provider.  Do not use street drugs.  Do not share needles.  Ask your health care provider for help if you need support or information about quitting drugs. Alcohol use  Do not drink alcohol if your health care provider tells you not to drink.  If you drink alcohol: ? Limit how much you have to 0-2 drinks a day. ? Be aware of how much alcohol is in your drink. In the [place] one drink equals one 12 oz bottle of beer (355 mL), one 5 oz glass of wine (148 mL), or one 1 oz glass of hard liquor (44 mL). General instructions  Schedule regular health, dental, and eye exams.  Stay current with your vaccines.  Tell your health care provider if: ? You often feel depressed. ? You have ever been abused or do not feel safe at home. Summary  Adopting a healthy lifestyle and getting preventive care are important in promoting health and wellness.  Follow your health care provider's instructions about healthy diet,  exercising, and getting tested or screened for diseases.  Follow your health care provider's instructions on monitoring your cholesterol and blood pressure. This information is not intended to replace advice given to you by your health care provider. Make sure you discuss any questions you have with your health care provider. Document Revised: 10/28/2018 Document Reviewed: 10/28/2018 Elsevier Patient Education  2021 [employer].

## 2020-12-27 NOTE — Assessment & Plan Note (Signed)
Ongoing despite compliance with daily iron replacement. Hemorrhoidal vs cameron ulcers - he never completed EGD. Recommend return to gen surg or GI, he declines at this time. Offered iron infusion, he will consider.

## 2020-12-27 NOTE — Assessment & Plan Note (Signed)
Chronic, BP elevated today - increase amlodipine to 10mg  daily, continue hctz 25mg  daily. RTC 6 mo HTN f/u

## 2020-12-27 NOTE — Assessment & Plan Note (Signed)
Preventative protocols reviewed and updated unless pt declined. Discussed healthy diet and lifestyle.  

## 2020-12-27 NOTE — Assessment & Plan Note (Signed)
Encouraged gen surg f/u when able.

## 2020-12-27 NOTE — Assessment & Plan Note (Addendum)
PSA stable

## 2020-12-27 NOTE — Progress Notes (Signed)
Patient ID: [name] [name], male    DOB: August 10, 1966, 55 y.o.   MRN: [id number]  This visit was conducted in person.  BP (!) 148/98 (BP Location: Right Arm, Cuff Size: Large)   Pulse 95   Temp 97.6 F (36.4 C) (Temporal)   Ht 5' 7.5" (1.715 m)   Wt 235 lb (106.6 kg)   SpO2 98%   BMI 36.26 kg/m    CC: CPE Subjective:   HPI: [name] [name] is a 55 y.o. male presenting on 12/27/2020 for Annual Exam   BP elevated today - doesn't regularly check at home. Last 130/80s a few weeks ago.   Known R ing hernia - has not had surgery yet - wants to eventually get done but has needed to be postponed. Wife currently going through medical issues as well (hip replacement that needs revision, fibroid surgery).   Ongoing IDA managed with daily iron pill.   Preventative: COLONOSCOPY Date: 12/2012 int hem, diverticulosis ([name] [name]) COLONOSCOPY 05/2017 inflamed int hem possible source of IDA, offered EGD to patient, diverticulosis, rpt 10 yrs([name] [name])  Prostate - desires check yearly. Nocturia x2-3, some weakening of stream.  Lung cancer screening - not eligible Flu shot - declines COVID vaccine - declines  Tetanus shot - 2011. Declines today  Shingrix - discussed.  Seat belt use discussed Sunscreen use discussed.No suspicious moles on skin  Non smoker  Alcohol - none  Dentist q6 mo  Eye exam yearly  Billing specialist - [facility] Lives with wife and son Activity: tries to walk daily for about 1 mi  Diet: good water, fruits/vegetables daily, fish 1x/wk     Relevant past medical, surgical, family and social history reviewed and updated as indicated. Interim medical history since our last visit reviewed. Allergies and medications reviewed and updated. Outpatient Medications Prior to Visit  Medication Sig Dispense Refill  . acetaminophen (TYLENOL) 500 MG tablet Take 2 tablets (1,000 mg total) by mouth 3 (three) times daily as needed for moderate pain.    Marland Kitchen diclofenac Sodium (VOLTAREN) 1 %  GEL Apply 2 g topically 3 (three) times daily. (Patient taking differently: Apply 2 g topically daily as needed (pain).) 100 g 3  . docusate sodium (COLACE) 100 MG capsule Take 1 capsule (100 mg total) by mouth daily. (Patient taking differently: Take 100 mg by mouth daily as needed for mild constipation or moderate constipation.)    . famotidine (PEPCID) 20 MG tablet Take 20-40 mg by mouth daily.     . ferrous sulfate 325 (65 FE) MG tablet Take 325 mg by mouth daily.     . hydrocortisone (ANUSOL-HC) 25 MG suppository Place 1 suppository (25 mg total) rectally 2 (two) times daily. (Patient taking differently: Place 25 mg rectally 2 (two) times daily as needed for hemorrhoids.) 12 suppository 0  . traMADol (ULTRAM) 50 MG tablet Take 1 tablet (50 mg total) by mouth every 12 (twelve) hours as needed for moderate pain. 50 tablet 0  . Turmeric 500 MG CAPS Take 1 capsule by mouth daily. (Patient taking differently: Take 500 mg by mouth 2 (two) times a week.)    . amLODipine (NORVASC) 5 MG tablet Take 1 tablet (5 mg total) by mouth daily. 90 tablet 3  . hydrochlorothiazide (HYDRODIURIL) 25 MG tablet TAKE ONE TABLET BY MOUTH EVERY DAY (Patient taking differently: Take 25 mg by mouth daily.) 90 tablet 3  . hydrOXYzine (ATARAX/VISTARIL) 25 MG tablet Take 0.5-1 tablets (12.5-25 mg total) by mouth 2 (two) times daily as  needed for anxiety (sedation precautions). 30 tablet 1  . methocarbamol (ROBAXIN) 500 MG tablet Take 1 tablet (500 mg total) by mouth 2 (two) times daily as needed for muscle spasms (sedation precautions). 30 tablet 3   No facility-administered medications prior to visit.     Per HPI unless specifically indicated in ROS section below Review of Systems  Constitutional: Negative for activity change, appetite change, chills, fatigue, fever and unexpected weight change.  HENT: Negative for hearing loss.   Eyes: Negative for visual disturbance.  Respiratory: Negative for cough, chest tightness,  shortness of breath and wheezing.   Cardiovascular: Negative for chest pain, palpitations and leg swelling.  Gastrointestinal: Positive for blood in stool (hemorrhoid) and constipation (managed with colace). Negative for abdominal distention, abdominal pain, diarrhea, nausea and vomiting.  Genitourinary: Negative for difficulty urinating and hematuria.  Musculoskeletal: Negative for arthralgias, myalgias and neck pain.  Skin: Negative for rash.  Neurological: Positive for dizziness (orthostatic lightheadedness). Negative for seizures, syncope and headaches.  Hematological: Negative for adenopathy. Does not bruise/bleed easily.  Psychiatric/Behavioral: Negative for dysphoric mood. The patient is nervous/anxious (work related stress).    Objective:  BP (!) 148/98 (BP Location: Right Arm, Cuff Size: Large)   Pulse 95   Temp 97.6 F (36.4 C) (Temporal)   Ht 5' 7.5" (1.715 m)   Wt 235 lb (106.6 kg)   SpO2 98%   BMI 36.26 kg/m   Wt Readings from Last 3 Encounters:  12/27/20 235 lb (106.6 kg)  04/28/20 240 lb (108.9 kg)  01/28/20 237 lb 6 oz (107.7 kg)      Physical Exam Vitals and nursing note reviewed.  Constitutional:      General: He is not in acute distress.    Appearance: Normal appearance. He is well-developed and well-nourished. He is obese. He is not ill-appearing.  HENT:     Head: Normocephalic and atraumatic.     Right Ear: Hearing, tympanic membrane, ear canal and external ear normal.     Left Ear: Hearing, tympanic membrane, ear canal and external ear normal.     Mouth/Throat:     Mouth: Oropharynx is clear and moist and mucous membranes are normal.     Pharynx: Uvula midline. No posterior oropharyngeal edema.  Eyes:     General: No scleral icterus.    Extraocular Movements: Extraocular movements intact and EOM normal.     Conjunctiva/sclera: Conjunctivae normal.     Pupils: Pupils are equal, round, and reactive to light.  Cardiovascular:     Rate and Rhythm: Normal  rate and regular rhythm.     Pulses: Normal pulses and intact distal pulses.          Radial pulses are 2+ on the right side and 2+ on the left side.     Heart sounds: Normal heart sounds. No murmur heard.   Pulmonary:     Effort: Pulmonary effort is normal. No respiratory distress.     Breath sounds: Normal breath sounds. No wheezing, rhonchi or rales.  Abdominal:     General: Abdomen is flat. Bowel sounds are normal. There is no distension.     Palpations: Abdomen is soft. There is no mass.     Tenderness: There is no abdominal tenderness. There is no guarding or rebound.     Hernia: No hernia is present.  Musculoskeletal:        General: No edema. Normal range of motion.     Cervical back: Normal range of motion and neck  supple.     Right lower leg: No edema.     Left lower leg: No edema.  Lymphadenopathy:     Cervical: No cervical adenopathy.  Skin:    General: Skin is warm and dry.     Findings: No rash.  Neurological:     General: No focal deficit present.     Mental Status: He is alert and oriented to person, place, and time.     Comments: CN grossly intact, station and gait intact  Psychiatric:        Mood and Affect: Mood and affect and mood normal.        Behavior: Behavior normal.        Thought Content: Thought content normal.        Judgment: Judgment normal.       Results for orders placed or performed in visit on 12/19/20  CBC with Differential/Platelet  Result Value Ref Range   WBC 5.7 3.4 - 10.8 x10E3/uL   RBC 4.60 4.14 - 5.80 x10E6/uL   Hemoglobin 10.7 (L) 13.0 - 17.7 g/dL   Hematocrit 90.2 (L) 40.9 - 51.0 %   MCV 75 (L) 79 - 97 fL   MCH 23.3 (L) 26.6 - 33.0 pg   MCHC 30.8 (L) 31.5 - 35.7 g/dL   RDW 73.5 (H) 32.9 - 92.4 %   Platelets 337 150 - 450 x10E3/uL   Neutrophils 64 Not Estab. %   Lymphs 22 Not Estab. %   Monocytes 11 Not Estab. %   Eos 2 Not Estab. %   Basos 1 Not Estab. %   Neutrophils Absolute 3.7 1.4 - 7.0 x10E3/uL   Lymphocytes  Absolute 1.3 0.7 - 3.1 x10E3/uL   Monocytes Absolute 0.6 0.1 - 0.9 x10E3/uL   EOS (ABSOLUTE) 0.1 0.0 - 0.4 x10E3/uL   Basophils Absolute 0.0 0.0 - 0.2 x10E3/uL   Immature Granulocytes 0 Not Estab. %   Immature Grans (Abs) 0.0 0.0 - 0.1 x10E3/uL  Comprehensive metabolic panel  Result Value Ref Range   Glucose 100 (H) 65 - 99 mg/dL   BUN 13 6 - 24 mg/dL   Creatinine, Ser 2.68 (H) 0.76 - 1.27 mg/dL   GFR calc non Af Amer 58 (L) >59 mL/min/1.73   GFR calc Af Amer 67 >59 mL/min/1.73   BUN/Creatinine Ratio 10 9 - 20   Sodium 140 134 - 144 mmol/L   Potassium 4.4 3.5 - 5.2 mmol/L   Chloride 103 96 - 106 mmol/L   CO2 22 20 - 29 mmol/L   Calcium 9.1 8.7 - 10.2 mg/dL   Total Protein 6.6 6.0 - 8.5 g/dL   Albumin 4.3 3.8 - 4.9 g/dL   Globulin, Total 2.3 1.5 - 4.5 g/dL   Albumin/Globulin Ratio 1.9 1.2 - 2.2   Bilirubin Total 0.7 0.0 - 1.2 mg/dL   Alkaline Phosphatase 45 44 - 121 IU/L   AST 14 0 - 40 IU/L   ALT 17 0 - 44 IU/L  Lipid Panel w/o Chol/HDL Ratio  Result Value Ref Range   Cholesterol, Total 213 (H) 100 - 199 mg/dL   Triglycerides 341 0 - 149 mg/dL   HDL 42 >96 mg/dL   VLDL Cholesterol Cal 19 5 - 40 mg/dL   LDL Chol Calc (NIH) 222 (H) 0 - 99 mg/dL  Iron and TIBC  Result Value Ref Range   Total Iron Binding Capacity 334 250 - 450 ug/dL   UIBC 979 892 - 119 ug/dL   Iron 30 (L)  38 - 169 ug/dL   Iron Saturation 9 (LL) 15 - 55 %  Microalbumin / creatinine urine ratio  Result Value Ref Range   Creatinine, Urine 95.5 Not Estab. mg/dL   Microalbumin, Urine 3.3 Not Estab. ug/mL   Microalb/Creat Ratio 3 0 - 29 mg/g creat  TSH  Result Value Ref Range   TSH 1.150 0.450 - 4.500 uIU/mL  PSA  Result Value Ref Range   Prostate Specific Ag, Serum 1.6 0.0 - 4.0 ng/mL  Specimen status report  Result Value Ref Range   specimen status report Comment   Ferritin  Result Value Ref Range   Ferritin 13 (L) 30 - 400 ng/mL  Transferrin  Result Value Ref Range   Transferrin 318 177 - 329  mg/dL  Specimen status report  Result Value Ref Range   specimen status report Comment    Assessment & Plan:  This visit occurred during the SARS-CoV-2 public health emergency.  Safety protocols were in place, including screening questions prior to the visit, additional usage of staff PPE, and extensive cleaning of exam room while observing appropriate contact time as indicated for disinfecting solutions.   Problem List Items Addressed This Visit    Severe obesity (BMI 35.0-35.9 with comorbidity) (HCC)    Encouraged ongoing weight loss through healthy diet and lifestyle choices.       Right inguinal hernia    Encouraged gen surg f/u when able.       Iron deficiency anemia    Ongoing despite compliance with daily iron replacement. Hemorrhoidal vs cameron ulcers - he never completed EGD. Recommend return to gen surg or GI, he declines at this time. Offered iron infusion, he will consider.       HTN (hypertension)    Chronic, BP elevated today - increase amlodipine to 10mg  daily, continue hctz 25mg  daily. RTC 6 mo HTN f/u      Relevant Medications   amLODipine (NORVASC) 10 MG tablet   hydrochlorothiazide (HYDRODIURIL) 25 MG tablet   HLD (hyperlipidemia)    Chronic, off medication. Declines statin The 10-year ASCVD risk score Denman George DC Jr., et al., 2013) is: 15.2%   Values used to calculate the score:     Age: 20 years     Sex: Male     Is Non-Hispanic African American: Yes     Diabetic: No     Tobacco smoker: No     Systolic Blood Pressure: 148 mmHg     Is BP treated: Yes     HDL Cholesterol: 42 mg/dL     Total Cholesterol: 213 mg/dL       Relevant Medications   amLODipine (NORVASC) 10 MG tablet   hydrochlorothiazide (HYDRODIURIL) 25 MG tablet   GERD (gastroesophageal reflux disease)    Continue pepcid daily.       Encounter for routine adult medical exam with abnormal findings - Primary    Preventative protocols reviewed and updated unless pt declined. Discussed  healthy diet and lifestyle.       BPH (benign prostatic hyperplasia)    PSA stable.           Meds ordered this encounter  Medications  . amLODipine (NORVASC) 10 MG tablet    Sig: Take 1 tablet (10 mg total) by mouth daily.    Dispense:  90 tablet    Refill:  3  . hydrochlorothiazide (HYDRODIURIL) 25 MG tablet    Sig: Take 1 tablet (25 mg total) by mouth daily.    Dispense:  90 tablet    Refill:  3  . methocarbamol (ROBAXIN) 500 MG tablet    Sig: Take 1 tablet (500 mg total) by mouth 2 (two) times daily as needed for muscle spasms (sedation precautions).    Dispense:  30 tablet    Refill:  3  . hydrOXYzine (ATARAX/VISTARIL) 25 MG tablet    Sig: Take 0.5-1 tablets (12.5-25 mg total) by mouth 2 (two) times daily as needed for anxiety (sedation precautions).    Dispense:  30 tablet    Refill:  1   No orders of the defined types were placed in this encounter.   Patient instructions: Consider tetanus shot. Consider shingrix vaccine series. I do recommend COVID vaccines.  Ongoing iron deficiency anemia - consider return to GI to discuss further evaluation or scheduling iron infusion. Let me know when ready to proceed with either. BP staying elevated - increase amlodipine to 10mg  daily, watching for ankle swelling. Limit salt/sodium in the diet, continue good water intake.   Return in 6 months for follow up visit and 1 year for next physical.   Follow up plan: Return in about 6 months (around 06/26/2021) for follow up visit.  Eustaquio BoydenJavier Amaris Delafuente, MD

## 2020-12-27 NOTE — Assessment & Plan Note (Signed)
Chronic, off medication. Declines statin The 10-year ASCVD risk score Denman George DC Jr., et al., 2013) is: 15.2%   Values used to calculate the score:     Age: 55 years     Sex: Male     Is Non-Hispanic African American: Yes     Diabetic: No     Tobacco smoker: No     Systolic Blood Pressure: 148 mmHg     Is BP treated: Yes     HDL Cholesterol: 42 mg/dL     Total Cholesterol: 213 mg/dL

## 2020-12-27 NOTE — Assessment & Plan Note (Signed)
Encouraged ongoing weight loss through healthy diet and lifestyle choices.

## 2021-01-10 ENCOUNTER — Other Ambulatory Visit: Payer: Self-pay | Admitting: Family Medicine

## 2021-01-10 NOTE — Telephone Encounter (Signed)
Refill request Tramadol Last refill 12/08/20 #50 Last office visit 12/27/20

## 2021-01-11 MED ORDER — TRAMADOL HCL 50 MG PO TABS: 50.0000 mg | ORAL_TABLET | Freq: Two times a day (BID) | ORAL | 0 refills | Status: DC | PRN

## 2021-01-11 NOTE — Telephone Encounter (Signed)
ERx 

## 2021-02-16 ENCOUNTER — Other Ambulatory Visit: Payer: Self-pay | Admitting: Family Medicine

## 2021-02-19 MED ORDER — TRAMADOL HCL 50 MG PO TABS: 50.0000 mg | ORAL_TABLET | Freq: Two times a day (BID) | ORAL | 0 refills | Status: DC | PRN

## 2021-02-19 NOTE — Telephone Encounter (Signed)
ERx 

## 2021-02-19 NOTE — Telephone Encounter (Signed)
LAST APPOINTMENT DATE: 01/10/2021   NEXT APPOINTMENT DATE: Visit date not found    LAST REFILL: 01/11/2021  QTY: 50

## 2021-03-29 NOTE — Telephone Encounter (Signed)
[facility] left v/m (individual calling did not leave her name) that due to insurance guidelines on 02/19/21 pt only got # 14 of tramadol and the rest of rx was disgarded. Pt called [facility] [address] requesting balance of tramadol rx. [facility] wanted to let Dr Reece Agar know that on 02/19/21 pt only got # 14. Sending note to Dr Reece Agar.

## 2021-03-29 NOTE — Addendum Note (Signed)
Addended by: Patience Musca on: 03/29/2021 12:29 PM   Modules accepted: Orders

## 2021-03-30 ENCOUNTER — Other Ambulatory Visit: Payer: Self-pay | Admitting: Family Medicine

## 2021-03-30 NOTE — Telephone Encounter (Signed)
Refill request Tramadol Last office visit 12/27/20 Last refill 02/19/21 #50 Upcoming appointment 07/06/21

## 2021-03-31 ENCOUNTER — Other Ambulatory Visit: Payer: Self-pay | Admitting: Family Medicine

## 2021-04-02 MED ORDER — TRAMADOL HCL 50 MG PO TABS: 50.0000 mg | ORAL_TABLET | Freq: Two times a day (BID) | ORAL | 0 refills | Status: DC | PRN

## 2021-04-02 NOTE — Telephone Encounter (Signed)
I spoke with pt and advised refill for tramadol already sent to [facility] [address] and pt said he had gotten an email and if further problems pt will cb otherwise nothing further needed.

## 2021-04-02 NOTE — Telephone Encounter (Signed)
ERx 

## 2021-04-02 NOTE — Telephone Encounter (Signed)
noted 

## 2021-04-02 NOTE — Telephone Encounter (Signed)
[facility] Night - Client Nonclinical Telephone Record AccessNurse Client [facility] Night - Client Client Site [facility] - Night Physician Eustaquio Boyden - MD Contact Type Call Who Is Calling Patient / Member / Family / Caregiver Caller Name Shervin Cypert Caller Phone Number [phone] Patient Name [name] [name] Patient DOB 28-Aug-1966 Call Type Message Only Information Provided Reason for Call Medication Question / Request Initial Comment Caller states he needs a refill of Tramadol. Additional Comment Caller declined triage. Office hours provided. Disp. Time Disposition Final User 04/02/2021 7:17:43 AM General Information Provided Yes Wine, Teddi Call Closed By: Jena Gauss Transaction Date/Time: 04/02/2021 7:15:48 AM (ET)

## 2021-04-22 ENCOUNTER — Telehealth: Payer: Self-pay | Admitting: Family Medicine

## 2021-04-22 NOTE — Telephone Encounter (Signed)
Received new disability request. See letter dated 10/10/2020. plz send requested records.  Letter also written and placed in my out box.

## 2021-05-14 ENCOUNTER — Telehealth: Payer: Self-pay | Admitting: Family Medicine

## 2021-05-14 NOTE — Telephone Encounter (Signed)
Patient dropped off FMLA paperwork/ Paperwork in box. EM

## 2021-05-14 NOTE — Telephone Encounter (Signed)
Semi completed FMLA paperwork placed in PCP's inbox for review, completion, sign and date  

## 2021-05-16 ENCOUNTER — Other Ambulatory Visit: Payer: Self-pay | Admitting: Family Medicine

## 2021-05-16 NOTE — Telephone Encounter (Signed)
Name of Medication: Tramadol Name of Pharmacy: [facility] Last Fill or Written Date and Quantity: 04/02/21, #50 Last Office Visit and Type: 12/27/20, CPE Next Office Visit and Type: 07/06/21, HTN f/u Last Controlled Substance Agreement Date: none Last UDS: none

## 2021-05-18 ENCOUNTER — Other Ambulatory Visit: Payer: Self-pay | Admitting: Family Medicine

## 2021-05-18 MED ORDER — TRAMADOL HCL 50 MG PO TABS: 50.0000 mg | ORAL_TABLET | Freq: Two times a day (BID) | ORAL | 0 refills | Status: DC | PRN

## 2021-05-18 NOTE — Telephone Encounter (Signed)
ERx 

## 2021-05-18 NOTE — Telephone Encounter (Signed)
Duplicate request

## 2021-05-22 NOTE — Telephone Encounter (Signed)
Filled and in my outbox

## 2021-05-23 ENCOUNTER — Encounter: Payer: Self-pay | Admitting: Family Medicine

## 2021-05-23 NOTE — Telephone Encounter (Signed)
Pt informed paperwork is completed and faxed  Copy for pt pick up  Copy for billing   Copy for scan  Copy retained by me

## 2021-06-22 ENCOUNTER — Other Ambulatory Visit: Payer: Self-pay | Admitting: Family Medicine

## 2021-06-22 NOTE — Telephone Encounter (Signed)
Name of Medication: Tramadol Name of Pharmacy: [facility] Last Fill or Written Date and Quantity: 05/18/21, #50 Last Office Visit and Type: 12/27/20, CPE Next Office Visit and Type: 07/06/21, 6 mo HTN f/u Last Controlled Substance Agreement Date: none Last UDS: none

## 2021-06-25 MED ORDER — TRAMADOL HCL 50 MG PO TABS: 50.0000 mg | ORAL_TABLET | Freq: Two times a day (BID) | ORAL | 0 refills | Status: DC | PRN

## 2021-06-25 NOTE — Telephone Encounter (Signed)
ERx 

## 2021-06-26 ENCOUNTER — Other Ambulatory Visit: Payer: Self-pay | Admitting: Family Medicine

## 2021-07-06 ENCOUNTER — Encounter: Payer: Self-pay | Admitting: Family Medicine

## 2021-07-06 ENCOUNTER — Other Ambulatory Visit: Payer: Self-pay

## 2021-07-06 ENCOUNTER — Ambulatory Visit: Payer: Managed Care, Other (non HMO) | Admitting: Family Medicine

## 2021-07-06 ENCOUNTER — Encounter: Payer: Self-pay | Admitting: *Deleted

## 2021-07-06 ENCOUNTER — Other Ambulatory Visit: Payer: Self-pay | Admitting: Family Medicine

## 2021-07-06 VITALS — BP 138/80 | HR 91 | Temp 97.4°F | Ht 67.5 in | Wt 206.4 lb

## 2021-07-06 DIAGNOSIS — R634 Abnormal weight loss: Secondary | ICD-10-CM | POA: Diagnosis not present

## 2021-07-06 DIAGNOSIS — K4090 Unilateral inguinal hernia, without obstruction or gangrene, not specified as recurrent: Secondary | ICD-10-CM

## 2021-07-06 DIAGNOSIS — E669 Obesity, unspecified: Secondary | ICD-10-CM

## 2021-07-06 DIAGNOSIS — I10 Essential (primary) hypertension: Secondary | ICD-10-CM | POA: Diagnosis not present

## 2021-07-06 DIAGNOSIS — D509 Iron deficiency anemia, unspecified: Secondary | ICD-10-CM

## 2021-07-06 NOTE — Assessment & Plan Note (Signed)
He's considering surgery later this year.

## 2021-07-06 NOTE — Assessment & Plan Note (Signed)
H/o this despite daily oral iron. Update labs. Discussed possible iron infusion.  Known large HH, presumed IDA due to cameron ulcer vs hemorrhoidal bleed.  With new unintentional weight loss recommend return to GI for further evaluation - he agres.  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

## 2021-07-06 NOTE — Assessment & Plan Note (Signed)
Newly noted - 30 lb weight loss over the past 6 months.  Known IDA.  Update labs. Will refer back to GI for further evaluation.

## 2021-07-06 NOTE — Patient Instructions (Addendum)
Labs today  We will refer you back to GI with noted weight loss.  Return as needed or in 6 months for physical.

## 2021-07-06 NOTE — Assessment & Plan Note (Signed)
Chronic, improved control on current regimen - continue.  

## 2021-07-06 NOTE — Assessment & Plan Note (Signed)
Discussed weight loss noted

## 2021-07-06 NOTE — Progress Notes (Signed)
Patient ID: [name] [name], male    DOB: 17-Sep-1966, 55 y.o.   MRN: [id number]  This visit was conducted in person.  BP 138/80   Pulse 91   Temp (!) 97.4 F (36.3 C) (Temporal)   Ht 5' 7.5" (1.715 m)   Wt 206 lb 7 oz (93.6 kg)   SpO2 98%   BMI 31.86 kg/m    CC: 6 mo f/u visit  Subjective:   HPI: [name] [name] is a 55 y.o. male presenting on 07/06/2021 for Hypertension (Here for 6 mo f/u.)   30 lb weight loss since last seen - hasn't been trying. Attributes to increased family stressors with wife's illness and he notes decreased appetite. No fevers/chills, night sweats, malaise, abd pain, bowel changes, blood in urine.   HTN - Compliant with current antihypertensive regimen of amlodipine 10mg  daily, hctz 25mg  daily. Does check blood pressures at home: "pretty good". No low blood pressure readings or symptoms of dizziness/syncope. Denies HA, vision changes, CP/tightness, SOB, leg swelling.    Known R inguinal hernia - thinking may try to get addressed by the end of the year.   Ongoing IDA managed with daily iron tablet, continues pepcid 20-40mg  daily. Hemorrhoidal vs cameron ulcers. Had GI evaluation including colonoscopy - recommended EGD, pt declined. He's declined iron infusion and return to gen surg/GI.      Relevant past medical, surgical, family and social history reviewed and updated as indicated. Interim medical history since our last visit reviewed. Allergies and medications reviewed and updated. Outpatient Medications Prior to Visit  Medication Sig Dispense Refill   acetaminophen (TYLENOL) 500 MG tablet Take 2 tablets (1,000 mg total) by mouth 3 (three) times daily as needed for moderate pain.     amLODipine (NORVASC) 10 MG tablet Take 1 tablet (10 mg total) by mouth daily. 90 tablet 3   diclofenac Sodium (VOLTAREN) 1 % GEL Apply 2 g topically 3 (three) times daily. (Patient taking differently: Apply 2 g topically daily as needed (pain).) 100 g 3   docusate sodium (COLACE)  100 MG capsule Take 1 capsule (100 mg total) by mouth daily. (Patient taking differently: Take 100 mg by mouth daily as needed for mild constipation or moderate constipation.)     famotidine (PEPCID) 20 MG tablet Take 20-40 mg by mouth daily.      ferrous sulfate 325 (65 FE) MG tablet Take 325 mg by mouth daily.      hydrochlorothiazide (HYDRODIURIL) 25 MG tablet Take 1 tablet (25 mg total) by mouth daily. 90 tablet 3   hydrocortisone (ANUSOL-HC) 25 MG suppository Place 1 suppository (25 mg total) rectally 2 (two) times daily. (Patient taking differently: Place 25 mg rectally 2 (two) times daily as needed for hemorrhoids.) 12 suppository 0   hydrOXYzine (ATARAX/VISTARIL) 25 MG tablet Take 0.5-1 tablets (12.5-25 mg total) by mouth 2 (two) times daily as needed for anxiety (sedation precautions). 30 tablet 1   methocarbamol (ROBAXIN) 500 MG tablet Take 1 tablet (500 mg total) by mouth 2 (two) times daily as needed for muscle spasms (sedation precautions). 30 tablet 3   traMADol (ULTRAM) 50 MG tablet Take 1 tablet (50 mg total) by mouth every 12 (twelve) hours as needed for moderate pain. 50 tablet 0   Turmeric 500 MG CAPS Take 1 capsule by mouth daily. (Patient taking differently: Take 500 mg by mouth 2 (two) times a week.)     No facility-administered medications prior to visit.     Per HPI unless  specifically indicated in ROS section below Review of Systems  Objective:  BP 138/80   Pulse 91   Temp (!) 97.4 F (36.3 C) (Temporal)   Ht 5' 7.5" (1.715 m)   Wt 206 lb 7 oz (93.6 kg)   SpO2 98%   BMI 31.86 kg/m   Wt Readings from Last 3 Encounters:  07/06/21 206 lb 7 oz (93.6 kg)  12/27/20 235 lb (106.6 kg)  04/28/20 240 lb (108.9 kg)      Physical Exam Vitals and nursing note reviewed.  Constitutional:      Appearance: Normal appearance. He is not ill-appearing.  Cardiovascular:     Rate and Rhythm: Normal rate and regular rhythm.     Pulses: Normal pulses.     Heart sounds: Normal  heart sounds. No murmur heard. Pulmonary:     Effort: Pulmonary effort is normal. No respiratory distress.     Breath sounds: Normal breath sounds. No wheezing, rhonchi or rales.  Abdominal:     General: Bowel sounds are normal. There is no distension.     Palpations: Abdomen is soft. There is no mass.     Tenderness: There is no abdominal tenderness. There is no guarding or rebound. Negative signs include Murphy's sign.     Hernia: No hernia is present.  Musculoskeletal:     Cervical back: Normal range of motion and neck supple.     Right lower leg: No edema.     Left lower leg: No edema.  Lymphadenopathy:     Cervical: No cervical adenopathy.  Skin:    General: Skin is warm and dry.     Findings: No rash.  Neurological:     Mental Status: He is alert.  Psychiatric:        Mood and Affect: Mood normal.        Behavior: Behavior normal.       Assessment & Plan:  This visit occurred during the SARS-CoV-2 public health emergency.  Safety protocols were in place, including screening questions prior to the visit, additional usage of staff PPE, and extensive cleaning of exam room while observing appropriate contact time as indicated for disinfecting solutions.   Problem List Items Addressed This Visit     Obesity, Class I, BMI 30-34.9    Discussed weight loss noted      Iron deficiency anemia    H/o this despite daily oral iron. Update labs. Discussed possible iron infusion.  Known large HH, presumed IDA due to cameron ulcer vs hemorrhoidal bleed.  With new unintentional weight loss recommend return to GI for further evaluation - he agres.  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       Relevant Orders   Ferritin   CBC with Differential/Platelet   Iron   Transferrin   Ambulatory referral to Gastroenterology   HTN (hypertension)    Chronic, improved control on current regimen - continue.       Unintentional weight loss -  Primary    Newly noted - 30 lb weight loss over the past 6 months.  Known IDA.  Update labs. Will refer back to GI for further evaluation.       Relevant Orders   Sedimentation rate   Comprehensive metabolic panel   CBC with Differential/Platelet   Ambulatory referral to Gastroenterology   Right inguinal hernia    He's considering surgery later this year.         No orders  of the defined types were placed in this encounter.  Orders Placed This Encounter  Procedures   Sedimentation rate   Ferritin   Comprehensive metabolic panel   CBC with Differential/Platelet   Iron   Transferrin   Ambulatory referral to Gastroenterology    Referral Priority:   Urgent    Referral Type:   Consultation    Referral Reason:   Specialty Services Required    Number of Visits Requested:   1    Patient Instructions  Labs today  We will refer you back to GI with noted weight loss.  Return as needed or in 6 months for physical.   Follow up plan: Return in about 6 months (around 01/06/2022) for annual exam, prior fasting for blood work.  Eustaquio Boyden, MD

## 2021-07-07 LAB — SPECIMEN STATUS REPORT

## 2021-07-09 LAB — CBC WITH DIFFERENTIAL/PLATELET
Basophils Absolute: 0 10*3/uL (ref 0.0–0.2)
Basos: 1 %
EOS (ABSOLUTE): 0.1 10*3/uL (ref 0.0–0.4)
Eos: 1 %
Hematocrit: 31.5 % — ABNORMAL LOW (ref 37.5–51.0)
Hemoglobin: 9.8 g/dL — ABNORMAL LOW (ref 13.0–17.7)
Immature Grans (Abs): 0 10*3/uL (ref 0.0–0.1)
Immature Granulocytes: 0 %
Lymphocytes Absolute: 1 10*3/uL (ref 0.7–3.1)
Lymphs: 16 %
MCH: 23.8 pg — ABNORMAL LOW (ref 26.6–33.0)
MCHC: 31.1 g/dL — ABNORMAL LOW (ref 31.5–35.7)
MCV: 77 fL — ABNORMAL LOW (ref 79–97)
Monocytes Absolute: 0.7 10*3/uL (ref 0.1–0.9)
Monocytes: 11 %
Neutrophils Absolute: 4.5 10*3/uL (ref 1.4–7.0)
Neutrophils: 71 %
Platelets: 361 10*3/uL (ref 150–450)
RBC: 4.11 x10E6/uL — ABNORMAL LOW (ref 4.14–5.80)
RDW: 15.1 % (ref 11.6–15.4)
WBC: 6.3 10*3/uL (ref 3.4–10.8)

## 2021-07-09 LAB — COMPREHENSIVE METABOLIC PANEL WITH GFR
ALT: 11 IU/L (ref 0–44)
AST: 13 IU/L (ref 0–40)
Albumin/Globulin Ratio: 2.1 (ref 1.2–2.2)
Albumin: 4.5 g/dL (ref 3.8–4.9)
Alkaline Phosphatase: 44 IU/L (ref 44–121)
BUN/Creatinine Ratio: 14 (ref 9–20)
BUN: 16 mg/dL (ref 6–24)
Bilirubin Total: 0.9 mg/dL (ref 0.0–1.2)
CO2: 24 mmol/L (ref 20–29)
Calcium: 9.4 mg/dL (ref 8.7–10.2)
Chloride: 97 mmol/L (ref 96–106)
Creatinine, Ser: 1.16 mg/dL (ref 0.76–1.27)
Globulin, Total: 2.1 g/dL (ref 1.5–4.5)
Glucose: 102 mg/dL — ABNORMAL HIGH (ref 65–99)
Potassium: 3.7 mmol/L (ref 3.5–5.2)
Sodium: 136 mmol/L (ref 134–144)
Total Protein: 6.6 g/dL (ref 6.0–8.5)
eGFR: 74 mL/min/1.73

## 2021-07-09 LAB — IRON: Iron: 88 ug/dL (ref 38–169)

## 2021-07-09 LAB — SEDIMENTATION RATE: Sed Rate: 17 mm/h (ref 0–30)

## 2021-07-09 LAB — TRANSFERRIN: Transferrin: 316 mg/dL (ref 177–329)

## 2021-07-09 LAB — FERRITIN: Ferritin: 17 ng/mL — ABNORMAL LOW (ref 30–400)

## 2021-08-01 ENCOUNTER — Other Ambulatory Visit: Payer: Self-pay | Admitting: Family Medicine

## 2021-08-01 MED ORDER — TRAMADOL HCL 50 MG PO TABS: 50.0000 mg | ORAL_TABLET | Freq: Two times a day (BID) | ORAL | 0 refills | Status: DC | PRN

## 2021-08-01 NOTE — Telephone Encounter (Signed)
ERx 

## 2021-08-01 NOTE — Telephone Encounter (Signed)
Name of Medication: Tramadol Name of Pharmacy: [facility] Last Fill or Written Date and Quantity: 06/25/21, #50 Last Office Visit and Type: 07/06/21, 6 mo HTN f/u Next Office Visit and Type: none Last Controlled Substance Agreement Date: none Last UDS: none

## 2021-08-31 ENCOUNTER — Other Ambulatory Visit: Payer: Self-pay | Admitting: Family Medicine

## 2021-08-31 MED ORDER — TRAMADOL HCL 50 MG PO TABS: 50.0000 mg | ORAL_TABLET | Freq: Two times a day (BID) | ORAL | 0 refills | Status: DC | PRN

## 2021-08-31 NOTE — Telephone Encounter (Signed)
Name of Medication: Tramadol Name of Pharmacy: [facility] [address] Last [place] or Written Date and Quantity: 08/01/21 #50 Last Office Visit and Type: 07/06/21  Next Office Visit and Type: none scheduled Last Controlled Substance Agreement Date: none Last ASN:KNLZ

## 2021-08-31 NOTE — Telephone Encounter (Signed)
ERx 

## 2021-10-12 ENCOUNTER — Other Ambulatory Visit: Payer: Self-pay | Admitting: Family Medicine

## 2021-10-16 ENCOUNTER — Other Ambulatory Visit: Payer: Self-pay | Admitting: Family Medicine

## 2021-10-16 MED ORDER — TRAMADOL HCL 50 MG PO TABS: 50.0000 mg | ORAL_TABLET | Freq: Two times a day (BID) | ORAL | 0 refills | Status: DC | PRN

## 2021-10-16 NOTE — Telephone Encounter (Signed)
Name of Medication: Tramadol Name of Pharmacy: [facility] [address] Last Fill or Written Date and Quantity: 08/31/21, #50 Last Office Visit and Type: 07/06/21, 6 mo HTN f/u Next Office Visit and Type: none Last Controlled Substance Agreement Date: none Last UDS: none

## 2021-10-16 NOTE — Telephone Encounter (Signed)
ERx 

## 2021-10-16 NOTE — Telephone Encounter (Signed)
Duplicate request

## 2021-10-17 NOTE — Telephone Encounter (Signed)
Declined-  Refilled yesterday

## 2021-10-31 ENCOUNTER — Encounter: Payer: Self-pay | Admitting: Family Medicine

## 2021-10-31 DIAGNOSIS — D509 Iron deficiency anemia, unspecified: Secondary | ICD-10-CM

## 2021-11-15 ENCOUNTER — Telehealth: Payer: Self-pay | Admitting: Pharmacy Technician

## 2021-11-15 ENCOUNTER — Other Ambulatory Visit: Payer: Self-pay | Admitting: Family Medicine

## 2021-11-15 NOTE — Progress Notes (Signed)
Iron infusion orders placed.

## 2021-11-15 NOTE — Telephone Encounter (Signed)
Dr. Sharen Hones,  Auth Submission: DENIED Payer: [employer] Medication & CPT/J Code(s) submitted: Feraheme (ferumoxytol) [phone] Route of submission (phone, fax, portal): PHONE [phone] Auth type: Buy/Bill Units/visits requested: 2 REF# [id number]  Denied due to patient has not tried and or failed step therapy.  Venofer Infed Ferrlecit  Would you like to try venofer??? Please advise.

## 2021-11-19 ENCOUNTER — Encounter: Payer: Self-pay | Admitting: Family Medicine

## 2021-11-19 NOTE — Telephone Encounter (Signed)
I've ordered Venofer. Thanks!

## 2021-11-19 NOTE — Addendum Note (Signed)
Addended by: Eustaquio Boyden on: 11/19/2021 10:58 AM   Modules accepted: Orders

## 2021-11-22 ENCOUNTER — Encounter: Payer: Self-pay | Admitting: Family Medicine

## 2021-11-26 NOTE — Telephone Encounter (Signed)
Filled out forms and placed in Lisa's box.

## 2021-11-26 NOTE — Telephone Encounter (Signed)
Faxed FMLA ppw to [employer] at [phone].  [Placed copy to scan and mailed original to pt for his records.]

## 2021-12-10 ENCOUNTER — Other Ambulatory Visit: Payer: Self-pay

## 2021-12-10 ENCOUNTER — Ambulatory Visit (INDEPENDENT_AMBULATORY_CARE_PROVIDER_SITE_OTHER): Payer: Managed Care, Other (non HMO) | Admitting: *Deleted

## 2021-12-10 ENCOUNTER — Encounter: Payer: Self-pay | Admitting: Family Medicine

## 2021-12-10 VITALS — BP 139/91 | HR 70 | Temp 97.5°F | Resp 16 | Ht 67.0 in | Wt 208.0 lb

## 2021-12-10 DIAGNOSIS — D509 Iron deficiency anemia, unspecified: Secondary | ICD-10-CM

## 2021-12-10 MED ORDER — SODIUM CHLORIDE 0.9 % IV SOLN
200.0000 mg | Freq: Once | INTRAVENOUS | Status: AC
  Administered 2021-12-10: 200 mg via INTRAVENOUS
  Filled 2021-12-10: qty 10

## 2021-12-10 MED ORDER — DIPHENHYDRAMINE HCL 25 MG PO CAPS
50.0000 mg | ORAL_CAPSULE | Freq: Once | ORAL | Status: AC
  Administered 2021-12-10: 50 mg via ORAL
  Filled 2021-12-10: qty 2

## 2021-12-10 MED ORDER — ACETAMINOPHEN 325 MG PO TABS
650.0000 mg | ORAL_TABLET | Freq: Once | ORAL | Status: AC
  Administered 2021-12-10: 650 mg via ORAL
  Filled 2021-12-10: qty 2

## 2021-12-10 NOTE — Progress Notes (Signed)
Diagnosis: Iron Deficiency Anemia  Provider:  Chilton Greathouse, MD  Procedure: Infusion  IV Type: Peripheral, IV Location: L Hand  Venofer (Iron Sucrose), Dose: 200 mg  Infusion Start Time: 0936 am  Infusion Stop Time: 0955 am  Post Infusion IV Care: Observation period completed and Peripheral IV Discontinued  Discharge: Condition: Good, Destination: Home . AVS provided to patient.   Performed by:  Forrest Moron, RN

## 2021-12-12 ENCOUNTER — Ambulatory Visit (INDEPENDENT_AMBULATORY_CARE_PROVIDER_SITE_OTHER): Payer: Managed Care, Other (non HMO)

## 2021-12-12 ENCOUNTER — Other Ambulatory Visit: Payer: Self-pay

## 2021-12-12 VITALS — BP 134/83 | HR 87 | Temp 98.0°F | Resp 18 | Ht 67.0 in | Wt 209.2 lb

## 2021-12-12 DIAGNOSIS — D509 Iron deficiency anemia, unspecified: Secondary | ICD-10-CM | POA: Diagnosis not present

## 2021-12-12 MED ORDER — FAMOTIDINE IN NACL 20-0.9 MG/50ML-% IV SOLN: 20.0000 mg | Freq: Once | INTRAVENOUS | Status: DC | PRN

## 2021-12-12 MED ORDER — DIPHENHYDRAMINE HCL 25 MG PO CAPS: 50.0000 mg | ORAL_CAPSULE | Freq: Once | ORAL | Status: DC

## 2021-12-12 MED ORDER — DIPHENHYDRAMINE HCL 50 MG/ML IJ SOLN: 50.0000 mg | Freq: Once | INTRAMUSCULAR | Status: DC | PRN

## 2021-12-12 MED ORDER — SODIUM CHLORIDE 0.9 % IV SOLN
200.0000 mg | Freq: Once | INTRAVENOUS | Status: AC
  Administered 2021-12-12: 10:00:00 200 mg via INTRAVENOUS
  Filled 2021-12-12: qty 10

## 2021-12-12 MED ORDER — ACETAMINOPHEN 325 MG PO TABS
650.0000 mg | ORAL_TABLET | Freq: Once | ORAL | Status: AC
  Administered 2021-12-12: 09:00:00 650 mg via ORAL
  Filled 2021-12-12: qty 2

## 2021-12-12 MED ORDER — ALBUTEROL SULFATE HFA 108 (90 BASE) MCG/ACT IN AERS: 2.0000 | INHALATION_SPRAY | Freq: Once | RESPIRATORY_TRACT | Status: DC | PRN

## 2021-12-12 MED ORDER — SODIUM CHLORIDE 0.9 % IV SOLN: Freq: Once | INTRAVENOUS | Status: DC | PRN

## 2021-12-12 MED ORDER — METHYLPREDNISOLONE SODIUM SUCC 125 MG IJ SOLR: 125.0000 mg | Freq: Once | INTRAMUSCULAR | Status: DC | PRN

## 2021-12-12 MED ORDER — EPINEPHRINE 0.3 MG/0.3ML IJ SOAJ: 0.3000 mg | Freq: Once | INTRAMUSCULAR | Status: DC | PRN

## 2021-12-12 NOTE — Progress Notes (Signed)
Diagnosis: Iron Deficiency Anemia  Provider:  Chilton Greathouse, MD  Procedure: Infusion  IV Type: Peripheral, IV Location: L Hand  Venofer (Iron Sucrose), Dose: 200 mg  Infusion Start Time: 0937  Infusion Stop Time: 0953  Post Infusion IV Care: Peripheral IV Discontinued  Discharge: Condition: Good, Destination: Home . AVS provided to patient.   Performed by:  Adriana Mccallum, RN

## 2021-12-14 ENCOUNTER — Encounter: Payer: Self-pay | Admitting: Family Medicine

## 2021-12-14 ENCOUNTER — Ambulatory Visit (INDEPENDENT_AMBULATORY_CARE_PROVIDER_SITE_OTHER): Payer: Managed Care, Other (non HMO)

## 2021-12-14 ENCOUNTER — Other Ambulatory Visit: Payer: Self-pay

## 2021-12-14 VITALS — BP 135/87 | HR 80 | Temp 97.8°F | Resp 18 | Ht 67.0 in | Wt 208.0 lb

## 2021-12-14 DIAGNOSIS — D509 Iron deficiency anemia, unspecified: Secondary | ICD-10-CM | POA: Diagnosis not present

## 2021-12-14 MED ORDER — METHYLPREDNISOLONE SODIUM SUCC 125 MG IJ SOLR: 125.0000 mg | Freq: Once | INTRAMUSCULAR | Status: DC | PRN

## 2021-12-14 MED ORDER — ALBUTEROL SULFATE HFA 108 (90 BASE) MCG/ACT IN AERS: 2.0000 | INHALATION_SPRAY | Freq: Once | RESPIRATORY_TRACT | Status: DC | PRN

## 2021-12-14 MED ORDER — EPINEPHRINE 0.3 MG/0.3ML IJ SOAJ: 0.3000 mg | Freq: Once | INTRAMUSCULAR | Status: DC | PRN

## 2021-12-14 MED ORDER — DIPHENHYDRAMINE HCL 50 MG/ML IJ SOLN: 50.0000 mg | Freq: Once | INTRAMUSCULAR | Status: DC | PRN

## 2021-12-14 MED ORDER — SODIUM CHLORIDE 0.9 % IV SOLN
200.0000 mg | Freq: Once | INTRAVENOUS | Status: AC
  Administered 2021-12-14: 200 mg via INTRAVENOUS
  Filled 2021-12-14: qty 10

## 2021-12-14 MED ORDER — FAMOTIDINE IN NACL 20-0.9 MG/50ML-% IV SOLN: 20.0000 mg | Freq: Once | INTRAVENOUS | Status: DC | PRN

## 2021-12-14 MED ORDER — DIPHENHYDRAMINE HCL 25 MG PO CAPS: 50.0000 mg | ORAL_CAPSULE | Freq: Once | ORAL | Status: DC

## 2021-12-14 MED ORDER — SODIUM CHLORIDE 0.9 % IV SOLN: Freq: Once | INTRAVENOUS | Status: DC | PRN

## 2021-12-14 MED ORDER — ACETAMINOPHEN 325 MG PO TABS: 650.0000 mg | ORAL_TABLET | Freq: Once | ORAL | Status: DC

## 2021-12-14 NOTE — Progress Notes (Signed)
Diagnosis: Iron Deficiency Anemia  Provider:  Chilton Greathouse, MD  Procedure: Infusion  IV Type: Peripheral, IV Location: L Hand  Venofer (Iron Sucrose), Dose: 200 mg  Infusion Start Time: 09.23 12/14/2021  Infusion Stop Time: 09.42 12/14/2021  Post Infusion IV Care: Peripheral IV Discontinued  Discharge: Condition: Good, Destination: Home . AVS provided to patient.   Performed by:  Garnette Czech, RN

## 2021-12-17 ENCOUNTER — Ambulatory Visit (INDEPENDENT_AMBULATORY_CARE_PROVIDER_SITE_OTHER): Payer: Managed Care, Other (non HMO) | Admitting: *Deleted

## 2021-12-17 ENCOUNTER — Other Ambulatory Visit: Payer: Self-pay

## 2021-12-17 VITALS — BP 133/89 | HR 85 | Temp 98.2°F | Resp 18 | Ht 67.0 in | Wt 204.8 lb

## 2021-12-17 DIAGNOSIS — D509 Iron deficiency anemia, unspecified: Secondary | ICD-10-CM

## 2021-12-17 MED ORDER — METHYLPREDNISOLONE SODIUM SUCC 125 MG IJ SOLR: 125.0000 mg | Freq: Once | INTRAMUSCULAR | Status: DC | PRN

## 2021-12-17 MED ORDER — DIPHENHYDRAMINE HCL 25 MG PO CAPS: 50.0000 mg | ORAL_CAPSULE | Freq: Once | ORAL | Status: DC

## 2021-12-17 MED ORDER — FAMOTIDINE IN NACL 20-0.9 MG/50ML-% IV SOLN: 20.0000 mg | Freq: Once | INTRAVENOUS | Status: DC | PRN

## 2021-12-17 MED ORDER — DIPHENHYDRAMINE HCL 50 MG/ML IJ SOLN: 50.0000 mg | Freq: Once | INTRAMUSCULAR | Status: DC | PRN

## 2021-12-17 MED ORDER — SODIUM CHLORIDE 0.9 % IV SOLN
200.0000 mg | Freq: Once | INTRAVENOUS | Status: AC
  Administered 2021-12-17: 200 mg via INTRAVENOUS
  Filled 2021-12-17: qty 10

## 2021-12-17 MED ORDER — ACETAMINOPHEN 325 MG PO TABS: 650.0000 mg | ORAL_TABLET | Freq: Once | ORAL | Status: DC

## 2021-12-17 MED ORDER — EPINEPHRINE 0.3 MG/0.3ML IJ SOAJ: 0.3000 mg | Freq: Once | INTRAMUSCULAR | Status: DC | PRN

## 2021-12-17 MED ORDER — SODIUM CHLORIDE 0.9 % IV SOLN: Freq: Once | INTRAVENOUS | Status: DC | PRN

## 2021-12-17 MED ORDER — ALBUTEROL SULFATE HFA 108 (90 BASE) MCG/ACT IN AERS: 2.0000 | INHALATION_SPRAY | Freq: Once | RESPIRATORY_TRACT | Status: DC | PRN

## 2021-12-17 NOTE — Progress Notes (Signed)
Diagnosis: Iron Deficiency Anemia  Provider:  Marshell Garfinkel, MD  Procedure: Infusion  IV Type: Peripheral, IV Location: L Antecubital  Venofer (Iron Sucrose), Dose: 200 mg  Infusion Start Time: [id number]  Infusion Stop Time: [id number] am  Post Infusion IV Care: Observation period completed and Peripheral IV Discontinued  Discharge: Condition: Good, Destination: Home . AVS provided to patient.   Performed by:  Oren Beckmann, RN

## 2021-12-17 NOTE — Telephone Encounter (Signed)
See next mychart message.

## 2021-12-19 ENCOUNTER — Ambulatory Visit (INDEPENDENT_AMBULATORY_CARE_PROVIDER_SITE_OTHER): Payer: Managed Care, Other (non HMO) | Admitting: *Deleted

## 2021-12-19 ENCOUNTER — Other Ambulatory Visit: Payer: Self-pay

## 2021-12-19 VITALS — BP 131/86 | HR 81 | Temp 98.0°F | Resp 16 | Ht 67.0 in | Wt 208.0 lb

## 2021-12-19 DIAGNOSIS — D509 Iron deficiency anemia, unspecified: Secondary | ICD-10-CM | POA: Diagnosis not present

## 2021-12-19 MED ORDER — EPINEPHRINE 0.3 MG/0.3ML IJ SOAJ: 0.3000 mg | Freq: Once | INTRAMUSCULAR | Status: DC | PRN

## 2021-12-19 MED ORDER — ALBUTEROL SULFATE HFA 108 (90 BASE) MCG/ACT IN AERS: 2.0000 | INHALATION_SPRAY | Freq: Once | RESPIRATORY_TRACT | Status: DC | PRN

## 2021-12-19 MED ORDER — DIPHENHYDRAMINE HCL 25 MG PO CAPS: 50.0000 mg | ORAL_CAPSULE | Freq: Once | ORAL | Status: DC

## 2021-12-19 MED ORDER — SODIUM CHLORIDE 0.9 % IV SOLN
200.0000 mg | Freq: Once | INTRAVENOUS | Status: AC
  Administered 2021-12-19: 200 mg via INTRAVENOUS
  Filled 2021-12-19: qty 10

## 2021-12-19 MED ORDER — FAMOTIDINE IN NACL 20-0.9 MG/50ML-% IV SOLN: 20.0000 mg | Freq: Once | INTRAVENOUS | Status: DC | PRN

## 2021-12-19 MED ORDER — ACETAMINOPHEN 325 MG PO TABS: 650.0000 mg | ORAL_TABLET | Freq: Once | ORAL | Status: AC

## 2021-12-19 MED ORDER — METHYLPREDNISOLONE SODIUM SUCC 125 MG IJ SOLR: 125.0000 mg | Freq: Once | INTRAMUSCULAR | Status: DC | PRN

## 2021-12-19 MED ORDER — SODIUM CHLORIDE 0.9 % IV SOLN: Freq: Once | INTRAVENOUS | Status: DC | PRN

## 2021-12-19 MED ORDER — DIPHENHYDRAMINE HCL 50 MG/ML IJ SOLN: 50.0000 mg | Freq: Once | INTRAMUSCULAR | Status: DC | PRN

## 2021-12-19 NOTE — Progress Notes (Signed)
Diagnosis: Iron Deficiency Anemia  Provider:  Marshell Garfinkel, MD  Procedure: Infusion  IV Type: Peripheral, IV Location: L Hand  Venofer (Iron Sucrose), Dose: 200 mg  Infusion Start Time: 0926 am  Infusion Stop Time: 0945 am  Post Infusion IV Care: Observation period completed and Peripheral IV Discontinued  Discharge: Condition: Good, Destination: Home . AVS provided to patient.   Performed by:  Oren Beckmann, RN

## 2021-12-24 ENCOUNTER — Encounter: Payer: Self-pay | Admitting: Family Medicine

## 2021-12-24 DIAGNOSIS — D509 Iron deficiency anemia, unspecified: Secondary | ICD-10-CM

## 2021-12-24 DIAGNOSIS — N401 Enlarged prostate with lower urinary tract symptoms: Secondary | ICD-10-CM

## 2021-12-24 DIAGNOSIS — E785 Hyperlipidemia, unspecified: Secondary | ICD-10-CM

## 2021-12-24 DIAGNOSIS — R351 Nocturia: Secondary | ICD-10-CM

## 2021-12-25 ENCOUNTER — Other Ambulatory Visit: Payer: Self-pay | Admitting: Family Medicine

## 2021-12-25 NOTE — Telephone Encounter (Signed)
Name of Medication: Tramadol Name of Pharmacy: [facility] Last Fill or Written Date and Quantity: 10/16/21, #50 Last Office Visit and Type: 07/06/21, 6 mo HTN f/u Next Office Visit and Type: 02/11/22, CPE Last Controlled Substance Agreement Date: none Last UDS: none

## 2021-12-25 NOTE — Telephone Encounter (Signed)
See other mychart message.

## 2021-12-26 MED ORDER — TRAMADOL HCL 50 MG PO TABS: 50.0000 mg | ORAL_TABLET | Freq: Two times a day (BID) | ORAL | 0 refills | Status: DC | PRN

## 2021-12-26 NOTE — Telephone Encounter (Signed)
ERx 

## 2022-01-09 ENCOUNTER — Other Ambulatory Visit: Payer: Self-pay | Admitting: Family Medicine

## 2022-01-09 NOTE — Telephone Encounter (Signed)
Spoke to patient by telephone and was advised that he got the prescription on 12/26/21 but the pharmacy only gave him #14. Patient stated that the pharmacist told him that his insurance would only cover #14 for a 7 day period at that time. Patient stated that he went back to the pharmacy to get the remainder of the script and was told that his doctor would have to send another script in. Patient stated that the Tramadol has helped with the pain that he was having.

## 2022-01-10 NOTE — Telephone Encounter (Signed)
ERx 

## 2022-01-23 ENCOUNTER — Other Ambulatory Visit: Payer: Managed Care, Other (non HMO)

## 2022-01-28 ENCOUNTER — Other Ambulatory Visit: Payer: Self-pay | Admitting: Pharmacy Technician

## 2022-01-30 ENCOUNTER — Encounter: Payer: Managed Care, Other (non HMO) | Admitting: Family Medicine

## 2022-01-30 LAB — CBC WITH DIFFERENTIAL/PLATELET
Basophils Absolute: 0 10*3/uL (ref 0.0–0.2)
Basos: 1 %
EOS (ABSOLUTE): 0.1 10*3/uL (ref 0.0–0.4)
Eos: 1 %
Hematocrit: 38.4 % (ref 37.5–51.0)
Hemoglobin: 12.3 g/dL — ABNORMAL LOW (ref 13.0–17.7)
Immature Grans (Abs): 0 10*3/uL (ref 0.0–0.1)
Immature Granulocytes: 0 %
Lymphocytes Absolute: 1.3 10*3/uL (ref 0.7–3.1)
Lymphs: 23 %
MCH: 25.3 pg — ABNORMAL LOW (ref 26.6–33.0)
MCHC: 32 g/dL (ref 31.5–35.7)
MCV: 79 fL (ref 79–97)
Monocytes Absolute: 0.6 10*3/uL (ref 0.1–0.9)
Monocytes: 11 %
Neutrophils Absolute: 3.6 10*3/uL (ref 1.4–7.0)
Neutrophils: 64 %
Platelets: 287 10*3/uL (ref 150–450)
RBC: 4.87 x10E6/uL (ref 4.14–5.80)
RDW: 17.6 % — ABNORMAL HIGH (ref 11.6–15.4)
WBC: 5.6 10*3/uL (ref 3.4–10.8)

## 2022-01-30 LAB — FERRITIN: Ferritin: 97 ng/mL (ref 30–400)

## 2022-01-30 LAB — COMPREHENSIVE METABOLIC PANEL
ALT: 14 IU/L (ref 0–44)
AST: 11 IU/L (ref 0–40)
Albumin/Globulin Ratio: 2 (ref 1.2–2.2)
Albumin: 4.8 g/dL (ref 3.8–4.9)
Alkaline Phosphatase: 42 IU/L — ABNORMAL LOW (ref 44–121)
BUN/Creatinine Ratio: 14 (ref 9–20)
BUN: 15 mg/dL (ref 6–24)
Bilirubin Total: 0.9 mg/dL (ref 0.0–1.2)
CO2: 27 mmol/L (ref 20–29)
Calcium: 9.9 mg/dL (ref 8.7–10.2)
Chloride: 99 mmol/L (ref 96–106)
Creatinine, Ser: 1.04 mg/dL (ref 0.76–1.27)
Globulin, Total: 2.4 g/dL (ref 1.5–4.5)
Glucose: 94 mg/dL (ref 70–99)
Potassium: 4 mmol/L (ref 3.5–5.2)
Sodium: 138 mmol/L (ref 134–144)
Total Protein: 7.2 g/dL (ref 6.0–8.5)
eGFR: 84 mL/min/{1.73_m2} (ref >59)

## 2022-01-30 LAB — LIPID PANEL
Chol/HDL Ratio: 4 ratio (ref 0.0–5.0)
Cholesterol, Total: 226 mg/dL — ABNORMAL HIGH (ref 100–199)
HDL: 56 mg/dL (ref >39)
LDL Chol Calc (NIH): 143 mg/dL — ABNORMAL HIGH (ref 0–99)
Triglycerides: 154 mg/dL — ABNORMAL HIGH (ref 0–149)
VLDL Cholesterol Cal: 27 mg/dL (ref 5–40)

## 2022-01-30 LAB — IRON AND TIBC
Iron Saturation: 19 % (ref 15–55)
Iron: 58 ug/dL (ref 38–169)
Total Iron Binding Capacity: 311 ug/dL (ref 250–450)
UIBC: 253 ug/dL (ref 111–343)

## 2022-01-30 LAB — PSA: Prostate Specific Ag, Serum: 1.3 ng/mL (ref 0.0–4.0)

## 2022-02-11 ENCOUNTER — Encounter: Payer: Self-pay | Admitting: Family Medicine

## 2022-02-11 ENCOUNTER — Other Ambulatory Visit: Payer: Self-pay

## 2022-02-11 ENCOUNTER — Ambulatory Visit (INDEPENDENT_AMBULATORY_CARE_PROVIDER_SITE_OTHER): Payer: Managed Care, Other (non HMO) | Admitting: Family Medicine

## 2022-02-11 VITALS — BP 130/80 | HR 90 | Temp 98.1°F | Ht 67.0 in | Wt 201.1 lb

## 2022-02-11 DIAGNOSIS — E785 Hyperlipidemia, unspecified: Secondary | ICD-10-CM

## 2022-02-11 DIAGNOSIS — D500 Iron deficiency anemia secondary to blood loss (chronic): Secondary | ICD-10-CM

## 2022-02-11 DIAGNOSIS — K4090 Unilateral inguinal hernia, without obstruction or gangrene, not specified as recurrent: Secondary | ICD-10-CM

## 2022-02-11 DIAGNOSIS — E66811 Obesity, class 1: Secondary | ICD-10-CM

## 2022-02-11 DIAGNOSIS — K219 Gastro-esophageal reflux disease without esophagitis: Secondary | ICD-10-CM

## 2022-02-11 DIAGNOSIS — E669 Obesity, unspecified: Secondary | ICD-10-CM

## 2022-02-11 DIAGNOSIS — Z0001 Encounter for general adult medical examination with abnormal findings: Secondary | ICD-10-CM

## 2022-02-11 DIAGNOSIS — N401 Enlarged prostate with lower urinary tract symptoms: Secondary | ICD-10-CM

## 2022-02-11 DIAGNOSIS — R351 Nocturia: Secondary | ICD-10-CM

## 2022-02-11 DIAGNOSIS — I10 Essential (primary) hypertension: Secondary | ICD-10-CM

## 2022-02-11 DIAGNOSIS — K648 Other hemorrhoids: Secondary | ICD-10-CM

## 2022-02-11 DIAGNOSIS — R634 Abnormal weight loss: Secondary | ICD-10-CM

## 2022-02-11 DIAGNOSIS — R1310 Dysphagia, unspecified: Secondary | ICD-10-CM | POA: Insufficient documentation

## 2022-02-11 MED ORDER — AMLODIPINE BESYLATE 10 MG PO TABS
10.0000 mg | ORAL_TABLET | Freq: Every day | ORAL | 3 refills | Status: DC
Start: 2022-02-11 — End: 2022-12-11

## 2022-02-11 MED ORDER — FERROUS SULFATE 325 (65 FE) MG PO TABS: 325.0000 mg | ORAL_TABLET | ORAL | Status: DC

## 2022-02-11 MED ORDER — HYDROCHLOROTHIAZIDE 25 MG PO TABS: 25.0000 mg | ORAL_TABLET | Freq: Every day | ORAL | 3 refills | Status: DC

## 2022-02-11 NOTE — Assessment & Plan Note (Signed)
This likely contributes to anemia - he will f/u with GI later this year to discuss banding procedure.  ?

## 2022-02-11 NOTE — Patient Instructions (Addendum)
Consider updated tetanus shot and shingles series.  ?Let me know when ready for new GI referral  ?I will go ahead and place referral for swallow study in [place].  ?Return in 6 months for lab visit.  ?Return in 1 year for next physical.  ? ?Health Maintenance, Male ?Adopting a healthy lifestyle and getting preventive care are important in promoting health and wellness. Ask your health care provider about: ?The right schedule for you to have regular tests and exams. ?Things you can do on your own to prevent diseases and keep yourself healthy. ?What should I know about diet, weight, and exercise? ?Eat a healthy diet ? ?Eat a diet that includes plenty of vegetables, fruits, low-fat dairy products, and lean protein. ?Do not eat a lot of foods that are high in solid fats, added sugars, or sodium. ?Maintain a healthy weight ?Body mass index (BMI) is a measurement that can be used to identify possible weight problems. It estimates body fat based on height and weight. Your health care provider can help determine your BMI and help you achieve or maintain a healthy weight. ?Get regular exercise ?Get regular exercise. This is one of the most important things you can do for your health. Most adults should: ?Exercise for at least 150 minutes each week. The exercise should increase your heart rate and make you sweat (moderate-intensity exercise). ?Do strengthening exercises at least twice a week. This is in addition to the moderate-intensity exercise. ?Spend less time sitting. Even light physical activity can be beneficial. ?Watch cholesterol and blood lipids ?Have your blood tested for lipids and cholesterol at 57 years of age, then have this test every 5 years. ?You may need to have your cholesterol levels checked more often if: ?Your lipid or cholesterol levels are high. ?You are older than 57 years of age. ?You are at high risk for heart disease. ?What should I know about cancer screening? ?Many types of cancers can be  detected early and may often be prevented. Depending on your health history and family history, you may need to have cancer screening at various ages. This may include screening for: ?Colorectal cancer. ?Prostate cancer. ?Skin cancer. ?Lung cancer. ?What should I know about heart disease, diabetes, and high blood pressure? ?Blood pressure and heart disease ?High blood pressure causes heart disease and increases the risk of stroke. This is more likely to develop in people who have high blood pressure readings or are overweight. ?Talk with your health care provider about your target blood pressure readings. ?Have your blood pressure checked: ?Every 3-5 years if you are 92-60 years of age. ?Every year if you are 64 years old or older. ?If you are between the ages of 29 and 19 and are a current or former smoker, ask your health care provider if you should have a one-time screening for abdominal aortic aneurysm (AAA). ?Diabetes ?Have regular diabetes screenings. This checks your fasting blood sugar level. Have the screening done: ?Once every three years after age 46 if you are at a normal weight and have a low risk for diabetes. ?More often and at a younger age if you are overweight or have a high risk for diabetes. ?What should I know about preventing infection? ?Hepatitis B ?If you have a higher risk for hepatitis B, you should be screened for this virus. Talk with your health care provider to find out if you are at risk for hepatitis B infection. ?Hepatitis C ?Blood testing is recommended for: ?Everyone born from [birth year] through  1966. ?Anyone with known risk factors for hepatitis C. ?Sexually transmitted infections (STIs) ?You should be screened each year for STIs, including gonorrhea and chlamydia, if: ?You are sexually active and are younger than 57 years of age. ?You are older than 57 years of age and your health care provider tells you that you are at risk for this type of infection. ?Your sexual activity has changed  since you were last screened, and you are at increased risk for chlamydia or gonorrhea. Ask your health care provider if you are at risk. ?Ask your health care provider about whether you are at high risk for HIV. Your health care provider may recommend a prescription medicine to help prevent HIV infection. If you choose to take medicine to prevent HIV, you should first get tested for HIV. You should then be tested every 3 months for as long as you are taking the medicine. ?Follow these instructions at home: ?Alcohol use ?Do not drink alcohol if your health care provider tells you not to drink. ?If you drink alcohol: ?Limit how much you have to 0-2 drinks a day. ?Know how much alcohol is in your drink. In the [place] one drink equals one 12 oz bottle of beer (355 mL), one 5 oz glass of wine (148 mL), or one 1? oz glass of hard liquor (44 mL). ?Lifestyle ?Do not use any products that contain nicotine or tobacco. These products include cigarettes, chewing tobacco, and vaping devices, such as e-cigarettes. If you need help quitting, ask your health care provider. ?Do not use street drugs. ?Do not share needles. ?Ask your health care provider for help if you need support or information about quitting drugs. ?General instructions ?Schedule regular health, dental, and eye exams. ?Stay current with your vaccines. ?Tell your health care provider if: ?You often feel depressed. ?You have ever been abused or do not feel safe at home. ?Summary ?Adopting a healthy lifestyle and getting preventive care are important in promoting health and wellness. ?Follow your health care provider's instructions about healthy diet, exercising, and getting tested or screened for diseases. ?Follow your health care provider's instructions on monitoring your cholesterol and blood pressure. ?This information is not intended to replace advice given to you by your health care provider. Make sure you discuss any questions you have with your health care  provider. ?Document Revised: 03/26/2021 Document Reviewed: 03/26/2021 ?Elsevier Patient Education ? [employer] ? ?

## 2022-02-11 NOTE — Progress Notes (Signed)
? ? Patient ID: [name] [name], male    DOB: 1965/12/10, 56 y.o.   MRN: [id number] ? ?This visit was conducted in person. ? ?BP 130/80   Pulse 90   Temp 98.1 ?F (36.7 ?C) (Temporal)   Ht 5\' 7"  (1.702 m)   Wt 201 lb 2 oz (91.2 kg)   SpO2 99%   BMI 31.50 kg/m?   ? ?CC: CPE ?Subjective:  ? ?HPI: ?[name] [name] is a 56 y.o. male presenting on 02/11/2022 for Annual Exam ? ? ?Known R inguinal hernia - had been postponing surgery. Noticing more cramping. He saw [facility] Dr Hassell Done 12/2021 - again recommended repair, robotic vs open. This has been scheduled 03/08/2022.  ? ?Known IDA thought hemorrhoidal vs cameron ulcers despite daily iron and pepcid, recently received iron infusion. S/p colonoscopy 2018 - at that time rec further evaluation (EGD) which he declined. In interim significant 35 lb weight loss in the past year - we placed referral to GI 06/2021 - he has not seen yet (declined). He received venofer iron infusion x5 (12/2021) with benefit. Didn't notice significant change to energy levels.  ? ?No appetite changes, abdominal pain, nausea, vomiting, diarrhea. Occasional constipation associated with hemorrhoidal bleed.  ? ?Occasional dysphagia -about once a month, lasts 5-10 min then goes away - with indigestion/acid reflux. No early satiety.  ? ?Continues tramadol PRN knee pain.  ? ?Preventative: ?COLONOSCOPY Date: 12/2012 int hem, diverticulosis ([facility])  ?COLONOSCOPY 05/2017 inflamed int hem possible source of IDA - offered EGD, diverticulosis, rpt 10 yrs ([facility])  ?Prostate - desires check yearly. Nocturia x2-3, some weakening of stream.  ?Lung cancer screening - not eligible ?Flu shot - declines  ?COVID vaccine - declines  ?Tetanus shot - 2011. Declines today  ?Shingrix - discussed  ?Seat belt use discussed  ?Sunscreen use discussed. No changing moles on skin.  ?Sleep - averaging 8 hours/night ?Non smoker  ?Alcohol - none  ?Dentist q6 mo  ?Eye exam yearly  ? ?Billing specialist - [facility] ?Lives with wife and  son ?Activity: tries to walk daily for about 1 mi  ?Diet: good water, fruits/vegetables daily, fish 1x/wk  ?   ? ?Relevant past medical, surgical, family and social history reviewed and updated as indicated. Interim medical history since our last visit reviewed. ?Allergies and medications reviewed and updated. ?Outpatient Medications Prior to Visit  ?Medication Sig Dispense Refill  ? acetaminophen (TYLENOL) 500 MG tablet Take 2 tablets (1,000 mg total) by mouth 3 (three) times daily as needed for moderate pain.    ? diclofenac Sodium (VOLTAREN) 1 % GEL Apply 2 g topically 3 (three) times daily. (Patient taking differently: Apply 2 g topically daily as needed (pain).) 100 g 3  ? docusate sodium (COLACE) 100 MG capsule Take 1 capsule (100 mg total) by mouth daily. (Patient taking differently: Take 100 mg by mouth daily as needed for mild constipation or moderate constipation.)    ? famotidine (PEPCID) 20 MG tablet Take 20-40 mg by mouth daily.     ? hydrocortisone (ANUSOL-HC) 25 MG suppository Place 1 suppository (25 mg total) rectally 2 (two) times daily. (Patient taking differently: Place 25 mg rectally 2 (two) times daily as needed for hemorrhoids.) 12 suppository 0  ? hydrOXYzine (ATARAX/VISTARIL) 25 MG tablet Take 0.5-1 tablets (12.5-25 mg total) by mouth 2 (two) times daily as needed for anxiety (sedation precautions). 30 tablet 1  ? methocarbamol (ROBAXIN) 500 MG tablet Take 1 tablet (500 mg total) by mouth 2 (two) times daily as  needed for muscle spasms (sedation precautions). 30 tablet 3  ? traMADol (ULTRAM) 50 MG tablet TAKE 1 TABLET BY MOUTH EVERY 12 HOURS AS NEEDED FOR MODERATE PAIN 30 tablet 0  ? Turmeric 500 MG CAPS Take 1 capsule by mouth daily. (Patient taking differently: Take 500 mg by mouth 2 (two) times a week.)    ? amLODipine (NORVASC) 10 MG tablet Take 1 tablet (10 mg total) by mouth daily. 90 tablet 3  ? hydrochlorothiazide (HYDRODIURIL) 25 MG tablet Take 1 tablet (25 mg total) by mouth daily.  90 tablet 3  ? ferrous sulfate 325 (65 FE) MG tablet Take 325 mg by mouth daily.  (Patient not taking: Reported on 02/11/2022)    ? ?No facility-administered medications prior to visit.  ?  ? ?Per HPI unless specifically indicated in ROS section below ?Review of Systems  ?Constitutional:  Negative for activity change, appetite change, chills, fatigue, fever and unexpected weight change.  ?HENT:  Negative for hearing loss.   ?Eyes:  Negative for visual disturbance.  ?Respiratory:  Negative for cough, chest tightness, shortness of breath and wheezing.   ?Cardiovascular:  Negative for chest pain, palpitations and leg swelling.  ?Gastrointestinal:  Positive for abdominal pain (cramping attributed to hernia), blood in stool (hemorrhoidal) and constipation. Negative for abdominal distention, diarrhea, nausea and vomiting.  ?Genitourinary:  Negative for difficulty urinating and hematuria.  ?Musculoskeletal:  Negative for arthralgias, myalgias and neck pain.  ?Skin:  Negative for rash.  ?Neurological:  Negative for dizziness, seizures, syncope and headaches.  ?Hematological:  Negative for adenopathy. Does not bruise/bleed easily.  ?Psychiatric/Behavioral:  Negative for dysphoric mood. The patient is not nervous/anxious.   ? ?Objective:  ?BP 130/80   Pulse 90   Temp 98.1 ?F (36.7 ?C) (Temporal)   Ht 5\' 7"  (1.702 m)   Wt 201 lb 2 oz (91.2 kg)   SpO2 99%   BMI 31.50 kg/m?   ?Wt Readings from Last 3 Encounters:  ?02/11/22 201 lb 2 oz (91.2 kg)  ?12/19/21 208 lb (94.3 kg)  ?12/17/21 204 lb 12.8 oz (92.9 kg)  ?  ?  ?Physical Exam ?Vitals and nursing note reviewed.  ?Constitutional:   ?   General: He is not in acute distress. ?   Appearance: Normal appearance. He is well-developed. He is not ill-appearing.  ?HENT:  ?   Head: Normocephalic and atraumatic.  ?   Right Ear: Hearing, tympanic membrane, ear canal and external ear normal.  ?   Left Ear: Hearing, tympanic membrane, ear canal and external ear normal.  ?Eyes:  ?    General: No scleral icterus. ?   Extraocular Movements: Extraocular movements intact.  ?   Conjunctiva/sclera: Conjunctivae normal.  ?   Pupils: Pupils are equal, round, and reactive to light.  ?Neck:  ?   Thyroid: No thyroid mass or thyromegaly.  ?Cardiovascular:  ?   Rate and Rhythm: Normal rate and regular rhythm.  ?   Pulses: Normal pulses.     ?     Radial pulses are 2+ on the right side and 2+ on the left side.  ?   Heart sounds: Normal heart sounds. No murmur heard. ?Pulmonary:  ?   Effort: Pulmonary effort is normal. No respiratory distress.  ?   Breath sounds: Normal breath sounds. No wheezing, rhonchi or rales.  ?Abdominal:  ?   General: Bowel sounds are normal. There is no distension.  ?   Palpations: Abdomen is soft. There is no mass.  ?  Tenderness: There is no abdominal tenderness. There is no guarding or rebound.  ?   Hernia: No hernia is present.  ?Musculoskeletal:     ?   General: Normal range of motion.  ?   Cervical back: Normal range of motion and neck supple.  ?   Right lower leg: No edema.  ?   Left lower leg: No edema.  ?Lymphadenopathy:  ?   Cervical: No cervical adenopathy.  ?Skin: ?   General: Skin is warm and dry.  ?   Findings: No rash.  ?Neurological:  ?   General: No focal deficit present.  ?   Mental Status: He is alert and oriented to person, place, and time.  ?Psychiatric:     ?   Mood and Affect: Mood normal.     ?   Behavior: Behavior normal.     ?   Thought Content: Thought content normal.     ?   Judgment: Judgment normal.  ? ?   ?Results for orders placed or performed in visit on 12/24/21  ?Iron and TIBC([facility])  ?Result Value Ref Range  ? Total Iron Binding Capacity 311 250 - 450 ug/dL  ? UIBC 253 111 - 343 ug/dL  ? Iron 58 38 - 169 ug/dL  ? Iron Saturation 19 15 - 55 %  ?PSA  ?Result Value Ref Range  ? Prostate Specific Ag, Serum 1.3 0.0 - 4.0 ng/mL  ?CBC with Differential/Platelet  ?Result Value Ref Range  ? WBC 5.6 3.4 - 10.8 x10E3/uL  ? RBC 4.87 4.14 - 5.80  x10E6/uL  ? Hemoglobin 12.3 (L) 13.0 - 17.7 g/dL  ? Hematocrit 38.4 37.5 - 51.0 %  ? MCV 79 79 - 97 fL  ? MCH 25.3 (L) 26.6 - 33.0 pg  ? MCHC 32.0 31.5 - 35.7 g/dL  ? RDW 17.6 (H) 11.6 - 15.4 %  ? Platelets 287 150 - 450 x10E3/uL

## 2022-02-11 NOTE — Assessment & Plan Note (Signed)
Improvement after venofer iron infusion x5.  ?Discussed this is still not addressing cause of IDA which remains unknown. Continue to recommend GI evaluation and risks of missed dx like a cancer - he states he will plan to see GI after he undergoes hernia repair next month.  ?

## 2022-02-11 NOTE — Assessment & Plan Note (Signed)
PSA remains stable

## 2022-02-11 NOTE — Assessment & Plan Note (Signed)
Upcoming hernia repair next monht [name] [name]) ?

## 2022-02-11 NOTE — Assessment & Plan Note (Signed)
Preventative protocols reviewed and updated unless pt declined. Discussed healthy diet and lifestyle.  

## 2022-02-11 NOTE — Assessment & Plan Note (Signed)
Managed with daily OTC pepcid.  ?

## 2022-02-11 NOTE — Assessment & Plan Note (Signed)
Chronic, off medication. ?The 10-year ASCVD risk score (Arnett DK, et al., 2019) is: 11.8% ?  Values used to calculate the score: ?    Age: 56 years ?    Sex: Male ?    Is Non-Hispanic African American: Yes ?    Diabetic: No ?    Tobacco smoker: No ?    Systolic Blood Pressure: 130 mmHg ?    Is BP treated: Yes ?    HDL Cholesterol: 56 mg/dL ?    Total Cholesterol: 226 mg/dL  ?

## 2022-02-11 NOTE — Assessment & Plan Note (Signed)
New endorsement - agrees to barium swallow.  ?

## 2022-02-11 NOTE — Assessment & Plan Note (Signed)
Another 5 lbs down - see above.  ?

## 2022-02-11 NOTE — Assessment & Plan Note (Signed)
Chronic, stable on current regimen - continue. 

## 2022-02-11 NOTE — Assessment & Plan Note (Signed)
Encouraged ongoing regular walking to maintain weight loss.  ?

## 2022-02-16 HISTORY — PX: INGUINAL HERNIA REPAIR: SUR1180

## 2022-02-26 NOTE — Progress Notes (Signed)
Surgery orders requested via Epic inbox. °

## 2022-02-28 NOTE — Patient Instructions (Addendum)
DUE TO COVID-19 ONLY ONE VISITOR  (aged 57 and older)  IS ALLOWED TO COME WITH YOU AND STAY IN THE WAITING ROOM ONLY DURING PRE OP AND PROCEDURE.   ?**NO VISITORS ARE ALLOWED IN THE SHORT STAY AREA OR RECOVERY ROOM!!** ? ? Your procedure is scheduled on: 03/08/22 ? ? Report to [facility] Main Entrance ? ?  Report to admitting at 8:45 AM ? ? Call this number if you have problems the morning of surgery [phone] ? ? Do not eat food :After Midnight. ? ? After Midnight you may have the following liquids until 8:00 AM DAY OF SURGERY ? ?Water ?Black Coffee (sugar ok, NO MILK/CREAM OR CREAMERS)  ?Tea (sugar ok, NO MILK/CREAM OR CREAMERS) regular and decaf                             ?Plain Jell-O (NO RED)                                           ?Fruit ices (not with fruit pulp, NO RED)                                     ?Popsicles (NO RED)                                                                  ?Juice: apple, WHITE grape, WHITE cranberry ?Sports drinks like Gatorade (NO RED) ?Clear broth(vegetable,chicken,beef) ? ?FOLLOW BOWEL PREP AND ANY ADDITIONAL PRE OP INSTRUCTIONS YOU RECEIVED FROM YOUR SURGEON'S OFFICE!!! ?  ?  ?Oral Hygiene is also important to reduce your risk of infection.                                    ?Remember - BRUSH YOUR TEETH THE MORNING OF SURGERY WITH YOUR REGULAR TOOTHPASTE ? ? Take these medicines the morning of surgery with A SIP OF WATER: Tylenol, Amlodipine, Pepcid, Tramadol.  ?                  ?           You may not have any metal on your body including jewelry, and body piercing ? ?           Do not wear lotions, powders, cologne, or deodorant  ? ?            Men may shave face and neck. ? ? Do not bring valuables to the hospital. [facility] IS NOT ?            RESPONSIBLE   FOR VALUABLES. ?  ? Patients discharged on the day of surgery will not be allowed to drive home.  Someone NEEDS to stay with you for the first 24 hours after anesthesia. ? ? Special Instructions:  Bring a copy of your healthcare power of attorney and living will documents         the day of  surgery if you haven't scanned them before. ? ?            Please read over the following fact sheets you were given: IF YOU HAVE QUESTIONS ABOUT YOUR PRE-OP INSTRUCTIONS PLEASE CALL [phone]- [facility] [place] ? ?   [facility] - Preparing for Surgery ?Before surgery, you can play an important role.  Because skin is not sterile, your skin needs to be as free of germs as possible.  You can reduce the number of germs on your skin by washing with CHG (chlorahexidine gluconate) soap before surgery.  CHG is an antiseptic cleaner which kills germs and bonds with the skin to continue killing germs even after washing. ?Please DO NOT use if you have an allergy to CHG or antibacterial soaps.  If your skin becomes reddened/irritated stop using the CHG and inform your nurse when you arrive at Short Stay. ?Do not shave (including legs and underarms) for at least 48 hours prior to the first CHG shower.  You may shave your face/neck. ? ?Please follow these instructions carefully: ? 1.  Shower with CHG Soap the night before surgery and the  morning of surgery. ? 2.  If you choose to wash your hair, wash your hair first as usual with your normal  shampoo. ? 3.  After you shampoo, rinse your hair and body thoroughly to remove the shampoo.                            ? 4.  Use CHG as you would any other liquid soap.  You can apply chg directly to the skin and wash.  Gently with a scrungie or clean washcloth. ? 5.  Apply the CHG Soap to your body ONLY FROM THE NECK DOWN.   Do   not use on face/ open      ?                     Wound or open sores. Avoid contact with eyes, ears mouth and   genitals (private parts).  ?                     Engineering geologist,  Genitals (private parts) with your normal soap. ?            6.  Wash thoroughly, paying special attention to the area where your    surgery  will be performed. ? 7.  Thoroughly rinse your body with warm  water from the neck down. ? 8.  DO NOT shower/wash with your normal soap after using and rinsing off the CHG Soap. ?               9.  Pat yourself dry with a clean towel. ?           10.  Wear clean pajamas. ?           11.  Place clean sheets on your bed the night of your first shower and do not  sleep with pets. ?Day of Surgery : ?Do not apply any lotions/deodorants the morning of surgery.  Please wear clean clothes to the hospital/surgery center. ? ?FAILURE TO FOLLOW THESE INSTRUCTIONS MAY RESULT IN THE CANCELLATION OF YOUR SURGERY ? ?PATIENT SIGNATURE_________________________________ ? ?NURSE SIGNATURE__________________________________ ? ?________________________________________________________________________  ?

## 2022-02-28 NOTE — Progress Notes (Addendum)
COVID Vaccine Completed: no ?Date COVID Vaccine completed: ?Has received booster: ?COVID vaccine manufacturer: [employer]  ? ?Date of COVID positive in last 90 days: no ? ?PCP - Eustaquio Boyden, MD ?Cardiologist - n/a ? ?Chest x-ray - n/a ?EKG - 03/01/22 Epic/chart ?Stress Test - n/a ?ECHO - yes years ago per pt ?Cardiac Cath - n/a ?Pacemaker/ICD device last checked: n/a ?Spinal Cord Stimulator: n/a ? ?Bowel Prep - n/a ? ?Sleep Study - n/a ?CPAP -  ? ?Fasting Blood Sugar - n/a ?Checks Blood Sugar _____ times a day ? ?Blood Thinner Instructions: n/a ?Aspirin Instructions: ?Last Dose: ? ?Activity level: Can go up a flight of stairs and perform activities of daily living without stopping and without symptoms of chest pain or shortness of breath. ?   ? ?Anesthesia review:  ? ?Patient denies shortness of breath, fever, cough and chest pain at PAT appointment ? ? ?Patient verbalized understanding of instructions that were given to them at the PAT appointment. Patient was also instructed that they will need to review over the PAT instructions again at home before surgery.  ?

## 2022-03-01 ENCOUNTER — Encounter (HOSPITAL_COMMUNITY)
Admission: RE | Admit: 2022-03-01 | Discharge: 2022-03-01 | Disposition: A | Discharge status: Home / Self Care | Payer: Managed Care, Other (non HMO) | Source: Ambulatory Visit | Attending: Surgery | Admitting: Surgery

## 2022-03-01 ENCOUNTER — Encounter (HOSPITAL_COMMUNITY): Payer: Self-pay

## 2022-03-01 VITALS — HR 79 | Temp 98.5°F | Resp 14 | Ht 67.0 in | Wt 204.4 lb

## 2022-03-01 DIAGNOSIS — Z01818 Encounter for other preprocedural examination: Secondary | ICD-10-CM | POA: Diagnosis not present

## 2022-03-01 DIAGNOSIS — I10 Essential (primary) hypertension: Secondary | ICD-10-CM | POA: Insufficient documentation

## 2022-03-01 HISTORY — DX: Personal history of urinary calculi: Z87.442

## 2022-03-01 HISTORY — DX: Anxiety disorder, unspecified: F41.9

## 2022-03-01 HISTORY — DX: Gastro-esophageal reflux disease without esophagitis: K21.9

## 2022-03-01 LAB — BASIC METABOLIC PANEL
Anion gap: 5 (ref 5–15)
BUN: 13 mg/dL (ref 6–20)
CO2: 29 mmol/L (ref 22–32)
Calcium: 9.3 mg/dL (ref 8.9–10.3)
Chloride: 102 mmol/L (ref 98–111)
Creatinine, Ser: 0.94 mg/dL (ref 0.61–1.24)
GFR, Estimated: >60 mL/min (ref >60)
Glucose, Bld: 97 mg/dL (ref 70–99)
Potassium: 3.8 mmol/L (ref 3.5–5.1)
Sodium: 136 mmol/L (ref 135–145)

## 2022-03-01 LAB — CBC
HCT: 39.5 % (ref 39.0–52.0)
Hemoglobin: 12.9 g/dL — ABNORMAL LOW (ref 13.0–17.0)
MCH: 26.7 pg (ref 26.0–34.0)
MCHC: 32.7 g/dL (ref 30.0–36.0)
MCV: 81.6 fL (ref 80.0–100.0)
Platelets: 261 10*3/uL (ref 150–400)
RBC: 4.84 MIL/uL (ref 4.22–5.81)
RDW: 15.8 % — ABNORMAL HIGH (ref 11.5–15.5)
WBC: 7 10*3/uL (ref 4.0–10.5)
nRBC: 0 % (ref 0.0–0.2)

## 2022-03-06 NOTE — H&P (Signed)
REFERRING PHYSICIAN:  Self ?  ?PROVIDER:  Katha Cabal, MD ?  ?MRN: [id number] ?DOB: [birth year] ?DATE OF ENCOUNTER: 03/08/22 ?  ?Subjective  ?  ?Chief Complaint: Recurrent RIH ?  ?  ?  ?History of Present Illness: ?[name] [name] is a 56 y.o. male who is seen today as an office consultation at the request of Dr. Seymour Bars for evaluation of Follow-up (Hernia) ?.   ?  ?I saw [name] [name] last July when he presented with his right inguinal hernia.  A CT scan at that time demonstrated fat contained in a what was probably a direct inguinal hernia.  This was repaired once in 1994.  I explained open repair at that time.  I had thought that this might be amenable to robotic repair but this is a scrotal hernia and I have explained that we should do this open ?  ?  ?Review of Systems: ?See HPI as well for other ROS. ?  ?ROS  ?  ?  ?Medical History: ?Past Medical History  ?    ?Past Medical History:  ?Diagnosis Date  ? Anemia    ? Arthritis    ? Asthma, unspecified asthma severity, unspecified whether complicated, unspecified whether persistent    ? GERD (gastroesophageal reflux disease)    ?  ?  ?  ?   ?Patient Active Problem List  ?Diagnosis  ? Abnormal abdominal CT scan  ? Bilateral knee pain  ? BPH (benign prostatic hyperplasia)  ? Encounter for routine adult medical exam with abnormal findings  ? GERD (gastroesophageal reflux disease)  ? Hepatic steatosis  ? HLD (hyperlipidemia)  ? HTN (hypertension)  ? Internal hemorrhoids  ? Iron deficiency anemia  ? Lower back pain  ? Nasal congestion  ? Obesity, Class I, BMI 30-34.9, unspecified  ? Right inguinal hernia  ? Unintentional weight loss  ?  ?  ?Past Surgical History  ?     ?Past Surgical History:  ?Procedure Laterality Date  ? HERNIA REPAIR      ?  ?  ?  ?Allergies  ?No Known Allergies  ? ?  ?      ?Current Outpatient Medications on File Prior to Visit  ?Medication Sig Dispense Refill  ? amLODIPine (NORVASC) 10 MG tablet Take 10 mg by mouth once daily      ?  hydroCHLOROthiazide (HYDRODIURIL) 25 MG tablet Take 1 tablet by mouth once daily      ? methocarbamoL (ROBAXIN) 500 MG tablet TAKE 1 TABLET BY MOUTH TWICE DAILY AS NEEDED FOR MUSCLE SPASM - SEDATION PRECAUTIONS      ? traMADoL (ULTRAM) 50 mg tablet        ?  ?No current facility-administered medications on file prior to visit.  ?  ?  ?Family History  ?History reviewed. No pertinent family history.  ?  ?  ?Social History  ?  ?   ?Tobacco Use  ?Smoking Status Never  ?Smokeless Tobacco Never  ?  ?  ?Social History  ?Social History  ?  ?    ?Socioeconomic History  ? Marital status: Married  ?Tobacco Use  ? Smoking status: Never  ? Smokeless tobacco: Never  ?Vaping Use  ? Vaping Use: Never used  ?Substance and Sexual Activity  ? Alcohol use: Not Currently  ? Drug use: Never  ?  ?  ?  ?Objective:  ?  ?  ?   ?Vitals:  ?  12/27/21 1134  ?BP: 130/80  ?Pulse: 96  ?SpO2: 98%  ?  Weight: 92.7 kg (204 lb 6.4 oz)  ?Height: 170.2 cm (5\' 7" )  ?  ?Body mass index is 32.01 kg/m?. ?  ?Physical Exam ?General: Well-maintained man in no acute distress. ?HEENT  : Unremarkable except for glasses ?Chest: Clear to auscultation ?Heart: Sinus rhythm without murmurs or gallops ?Breast: Not examined ?Abdomen: Nontender ?GU bulge in the right inguinal region going down in the scrotum consistent with a scrotal hernia. ?Rectal not performed ?Extremities full range of motion ?Neuro alert and oriented x3.  Motor and sensory function grossly intact ?  ?  ?  ?  ?Labs, Imaging and Diagnostic Testing: ?None to review ?  ?Assessment and Plan:  ?Diagnoses and all orders for this visit: ?  ?Recurrent right inguinal hernia ?  ?  ?  ?Scrotal hernia-recurrent.  Will proceed with open RIH.  I have answered his questions and we are ready to proceed.   ? ?  ?Kahlee Metivier , MD  ?  ? ?

## 2022-03-08 ENCOUNTER — Encounter: Payer: Self-pay | Admitting: Family Medicine

## 2022-03-08 ENCOUNTER — Ambulatory Visit (HOSPITAL_COMMUNITY): Payer: Managed Care, Other (non HMO) | Admitting: Anesthesiology

## 2022-03-08 ENCOUNTER — Ambulatory Visit (HOSPITAL_COMMUNITY)
Admission: RE | Admit: 2022-03-08 | Discharge: 2022-03-08 | Disposition: A | Discharge status: Home / Self Care | Payer: Managed Care, Other (non HMO) | Attending: Surgery | Admitting: Surgery

## 2022-03-08 ENCOUNTER — Other Ambulatory Visit: Payer: Self-pay

## 2022-03-08 ENCOUNTER — Ambulatory Visit (HOSPITAL_BASED_OUTPATIENT_CLINIC_OR_DEPARTMENT_OTHER): Payer: Managed Care, Other (non HMO) | Admitting: Anesthesiology

## 2022-03-08 ENCOUNTER — Encounter (HOSPITAL_COMMUNITY): Payer: Self-pay | Admitting: Surgery

## 2022-03-08 ENCOUNTER — Encounter (HOSPITAL_COMMUNITY)
Admission: RE | Disposition: A | Discharge status: Home / Self Care | Payer: Self-pay | Source: Home / Self Care | Attending: Surgery

## 2022-03-08 DIAGNOSIS — K4090 Unilateral inguinal hernia, without obstruction or gangrene, not specified as recurrent: Secondary | ICD-10-CM

## 2022-03-08 DIAGNOSIS — J45909 Unspecified asthma, uncomplicated: Secondary | ICD-10-CM | POA: Diagnosis not present

## 2022-03-08 DIAGNOSIS — D759 Disease of blood and blood-forming organs, unspecified: Secondary | ICD-10-CM | POA: Diagnosis not present

## 2022-03-08 DIAGNOSIS — M1990 Unspecified osteoarthritis, unspecified site: Secondary | ICD-10-CM | POA: Insufficient documentation

## 2022-03-08 DIAGNOSIS — K4091 Unilateral inguinal hernia, without obstruction or gangrene, recurrent: Secondary | ICD-10-CM | POA: Diagnosis present

## 2022-03-08 DIAGNOSIS — I10 Essential (primary) hypertension: Secondary | ICD-10-CM | POA: Insufficient documentation

## 2022-03-08 DIAGNOSIS — D649 Anemia, unspecified: Secondary | ICD-10-CM | POA: Diagnosis not present

## 2022-03-08 SURGERY — HERNIORRHAPHY, INGUINAL, ROBOT-ASSISTED, LAPAROSCOPIC
Anesthesia: General | Site: Abdomen | Laterality: Right

## 2022-03-08 MED ORDER — BUPIVACAINE-MELOXICAM ER 200-6 MG/7ML IJ SOLN
INTRAMUSCULAR | Status: DC | PRN
  Administered 2022-03-08: 7 mL

## 2022-03-08 MED ORDER — DEXAMETHASONE SODIUM PHOSPHATE 10 MG/ML IJ SOLN
INTRAMUSCULAR | Status: DC | PRN
  Administered 2022-03-08: 8 mg via INTRAVENOUS

## 2022-03-08 MED ORDER — CHLORHEXIDINE GLUCONATE CLOTH 2 % EX PADS: 6.0000 | MEDICATED_PAD | Freq: Once | CUTANEOUS | Status: DC

## 2022-03-08 MED ORDER — CELECOXIB 200 MG PO CAPS
200.0000 mg | ORAL_CAPSULE | Freq: Once | ORAL | Status: AC
  Administered 2022-03-08: 200 mg via ORAL
  Filled 2022-03-08: qty 1

## 2022-03-08 MED ORDER — ROCURONIUM BROMIDE 10 MG/ML (PF) SYRINGE
PREFILLED_SYRINGE | INTRAVENOUS | Status: AC
  Filled 2022-03-08: qty 30

## 2022-03-08 MED ORDER — PHENYLEPHRINE HCL (PRESSORS) 10 MG/ML IV SOLN
INTRAVENOUS | Status: AC
  Filled 2022-03-08: qty 1

## 2022-03-08 MED ORDER — PROPOFOL 10 MG/ML IV BOLUS
INTRAVENOUS | Status: AC
  Filled 2022-03-08: qty 20

## 2022-03-08 MED ORDER — OXYCODONE HCL 5 MG PO TABS
ORAL_TABLET | ORAL | Status: AC
  Filled 2022-03-08: qty 1

## 2022-03-08 MED ORDER — ACETAMINOPHEN 500 MG PO TABS: 1000.0000 mg | ORAL_TABLET | ORAL | Status: DC

## 2022-03-08 MED ORDER — SUCCINYLCHOLINE CHLORIDE 200 MG/10ML IV SOSY
PREFILLED_SYRINGE | INTRAVENOUS | Status: AC
  Filled 2022-03-08: qty 10

## 2022-03-08 MED ORDER — FENTANYL CITRATE (PF) 250 MCG/5ML IJ SOLN
INTRAMUSCULAR | Status: AC
Start: 2022-03-08 — End: ?
  Filled 2022-03-08: qty 5

## 2022-03-08 MED ORDER — KETAMINE HCL 10 MG/ML IJ SOLN
INTRAMUSCULAR | Status: DC | PRN
  Administered 2022-03-08: 30 mg via INTRAVENOUS

## 2022-03-08 MED ORDER — ACETAMINOPHEN 500 MG PO TABS: 1000.0000 mg | ORAL_TABLET | Freq: Once | ORAL | Status: DC

## 2022-03-08 MED ORDER — OXYCODONE HCL 5 MG/5ML PO SOLN: 5.0000 mg | Freq: Once | ORAL | Status: AC | PRN

## 2022-03-08 MED ORDER — PHENYLEPHRINE HCL-NACL 20-0.9 MG/250ML-% IV SOLN
INTRAVENOUS | Status: DC | PRN
  Administered 2022-03-08: 25 ug/min via INTRAVENOUS

## 2022-03-08 MED ORDER — FENTANYL CITRATE (PF) 250 MCG/5ML IJ SOLN
INTRAMUSCULAR | Status: DC | PRN
  Administered 2022-03-08 (×3): 50 ug via INTRAVENOUS
  Administered 2022-03-08: 100 ug via INTRAVENOUS

## 2022-03-08 MED ORDER — ROCURONIUM BROMIDE 10 MG/ML (PF) SYRINGE
PREFILLED_SYRINGE | INTRAVENOUS | Status: DC | PRN
  Administered 2022-03-08: 5 mg via INTRAVENOUS
  Administered 2022-03-08: 10 mg via INTRAVENOUS
  Administered 2022-03-08: 50 mg via INTRAVENOUS
  Administered 2022-03-08: 10 mg via INTRAVENOUS

## 2022-03-08 MED ORDER — PROPOFOL 10 MG/ML IV BOLUS
INTRAVENOUS | Status: DC | PRN
  Administered 2022-03-08: 180 mg via INTRAVENOUS

## 2022-03-08 MED ORDER — SUGAMMADEX SODIUM 200 MG/2ML IV SOLN
INTRAVENOUS | Status: DC | PRN
  Administered 2022-03-08: 200 mg via INTRAVENOUS

## 2022-03-08 MED ORDER — ONDANSETRON HCL 4 MG/2ML IJ SOLN
INTRAMUSCULAR | Status: AC
  Filled 2022-03-08: qty 4

## 2022-03-08 MED ORDER — ONDANSETRON HCL 4 MG/2ML IJ SOLN
INTRAMUSCULAR | Status: DC | PRN
  Administered 2022-03-08: 4 mg via INTRAVENOUS

## 2022-03-08 MED ORDER — 0.9 % SODIUM CHLORIDE (POUR BTL) OPTIME
TOPICAL | Status: DC | PRN
  Administered 2022-03-08: 1000 mL

## 2022-03-08 MED ORDER — EPHEDRINE 5 MG/ML INJ
INTRAVENOUS | Status: AC
  Filled 2022-03-08: qty 5

## 2022-03-08 MED ORDER — BUPIVACAINE-MELOXICAM ER 200-6 MG/7ML IJ SOLN
INTRAMUSCULAR | Status: AC
  Filled 2022-03-08: qty 1

## 2022-03-08 MED ORDER — SCOPOLAMINE 1 MG/3DAYS TD PT72
1.0000 | MEDICATED_PATCH | TRANSDERMAL | Status: DC
  Administered 2022-03-08: 1.5 mg via TRANSDERMAL
  Filled 2022-03-08: qty 1

## 2022-03-08 MED ORDER — PHENYLEPHRINE 80 MCG/ML (10ML) SYRINGE FOR IV PUSH (FOR BLOOD PRESSURE SUPPORT)
PREFILLED_SYRINGE | INTRAVENOUS | Status: AC
  Filled 2022-03-08: qty 10

## 2022-03-08 MED ORDER — DEXAMETHASONE SODIUM PHOSPHATE 10 MG/ML IJ SOLN
INTRAMUSCULAR | Status: AC
  Filled 2022-03-08: qty 2

## 2022-03-08 MED ORDER — MIDAZOLAM HCL 2 MG/2ML IJ SOLN
INTRAMUSCULAR | Status: DC | PRN
  Administered 2022-03-08 (×2): 1 mg via INTRAVENOUS

## 2022-03-08 MED ORDER — CEFAZOLIN SODIUM-DEXTROSE 2-4 GM/100ML-% IV SOLN
2.0000 g | INTRAVENOUS | Status: AC
  Administered 2022-03-08: 2 g via INTRAVENOUS
  Filled 2022-03-08: qty 100

## 2022-03-08 MED ORDER — LIDOCAINE HCL (PF) 2 % IJ SOLN
INTRAMUSCULAR | Status: AC
  Filled 2022-03-08: qty 10

## 2022-03-08 MED ORDER — CHLORHEXIDINE GLUCONATE 0.12 % MT SOLN
15.0000 mL | Freq: Once | OROMUCOSAL | Status: AC
  Administered 2022-03-08: 15 mL via OROMUCOSAL

## 2022-03-08 MED ORDER — LIDOCAINE 2% (20 MG/ML) 5 ML SYRINGE
INTRAMUSCULAR | Status: DC | PRN
  Administered 2022-03-08: 40 mg via INTRAVENOUS
  Administered 2022-03-08: 60 mg via INTRAVENOUS

## 2022-03-08 MED ORDER — MIDAZOLAM HCL 2 MG/2ML IJ SOLN
INTRAMUSCULAR | Status: AC
  Filled 2022-03-08: qty 2

## 2022-03-08 MED ORDER — ONDANSETRON HCL 4 MG/2ML IJ SOLN: 4.0000 mg | Freq: Once | INTRAMUSCULAR | Status: DC | PRN

## 2022-03-08 MED ORDER — ORAL CARE MOUTH RINSE: 15.0000 mL | Freq: Once | OROMUCOSAL | Status: AC

## 2022-03-08 MED ORDER — OXYCODONE HCL 5 MG PO TABS
5.0000 mg | ORAL_TABLET | Freq: Once | ORAL | Status: AC | PRN
  Administered 2022-03-08: 5 mg via ORAL

## 2022-03-08 MED ORDER — LACTATED RINGERS IV SOLN: INTRAVENOUS | Status: DC

## 2022-03-08 MED ORDER — FENTANYL CITRATE PF 50 MCG/ML IJ SOSY: 25.0000 ug | PREFILLED_SYRINGE | INTRAMUSCULAR | Status: DC | PRN

## 2022-03-08 MED ORDER — HYDROCODONE-ACETAMINOPHEN 5-325 MG PO TABS: 1.0000 | ORAL_TABLET | Freq: Four times a day (QID) | ORAL | 0 refills | Status: DC | PRN

## 2022-03-08 SURGICAL SUPPLY — 64 items
APPLICATOR COTTON TIP 6 STRL (MISCELLANEOUS) ×2 IMPLANT
APPLICATOR COTTON TIP 6IN STRL (MISCELLANEOUS)
BAG COUNTER SPONGE SURGICOUNT (BAG) IMPLANT
BLADE SURG 15 STRL LF DISP TIS (BLADE) ×1 IMPLANT
BLADE SURG 15 STRL SS (BLADE) ×1
CANNULA REDUC XI 12-8 STAPL (CANNULA)
CANNULA REDUCER 12-8 DVNC XI (CANNULA) ×1 IMPLANT
CHLORAPREP W/TINT 26 (MISCELLANEOUS) ×1 IMPLANT
COVER MAYO STAND STRL (DRAPES) ×1 IMPLANT
COVER SURGICAL LIGHT HANDLE (MISCELLANEOUS) ×2 IMPLANT
COVER TIP SHEARS 8 DVNC (MISCELLANEOUS) ×1 IMPLANT
COVER TIP SHEARS 8MM DA VINCI (MISCELLANEOUS)
DERMABOND ADVANCED (GAUZE/BANDAGES/DRESSINGS) ×1
DERMABOND ADVANCED .7 DNX12 (GAUZE/BANDAGES/DRESSINGS) IMPLANT
DRAIN PENROSE 0.5X18 (DRAIN) ×1 IMPLANT
DRAPE ARM DVNC X/XI (DISPOSABLE) ×3 IMPLANT
DRAPE COLUMN DVNC XI (DISPOSABLE) ×1 IMPLANT
DRAPE DA VINCI XI ARM (DISPOSABLE)
DRAPE DA VINCI XI COLUMN (DISPOSABLE)
ELECT REM PT RETURN 15FT ADLT (MISCELLANEOUS) ×2 IMPLANT
GLOVE SURG LX 8.0 MICRO (GLOVE) ×2
GLOVE SURG LX STRL 8.0 MICRO (GLOVE) ×2 IMPLANT
GOWN STRL REUS W/ TWL XL LVL3 (GOWN DISPOSABLE) ×3 IMPLANT
GOWN STRL REUS W/TWL XL LVL3 (GOWN DISPOSABLE) ×2
GRASPER SUT TROCAR 14GX15 (MISCELLANEOUS) ×1 IMPLANT
IRRIG SUCT STRYKERFLOW 2 WTIP (MISCELLANEOUS)
IRRIGATION SUCT STRKRFLW 2 WTP (MISCELLANEOUS) ×1 IMPLANT
KIT BASIN OR (CUSTOM PROCEDURE TRAY) ×2 IMPLANT
KIT TURNOVER KIT A (KITS) IMPLANT
MARKER SKIN DUAL TIP RULER LAB (MISCELLANEOUS) ×2 IMPLANT
MESH HERNIA 3X6 (Mesh General) ×1 IMPLANT
NEEDLE HYPO 22GX1.5 SAFETY (NEEDLE) ×2 IMPLANT
OBTURATOR OPTICAL STANDARD 8MM (TROCAR)
OBTURATOR OPTICAL STND 8 DVNC (TROCAR)
OBTURATOR OPTICALSTD 8 DVNC (TROCAR) ×1 IMPLANT
PACK CARDIOVASCULAR III (CUSTOM PROCEDURE TRAY) ×1 IMPLANT
PACK GENERAL/GYN (CUSTOM PROCEDURE TRAY) ×1 IMPLANT
PAD POSITIONING PINK XL (MISCELLANEOUS) ×1 IMPLANT
SCISSORS LAP 5X35 DISP (ENDOMECHANICALS) IMPLANT
SEAL CANN UNIV 5-8 DVNC XI (MISCELLANEOUS) ×2 IMPLANT
SEAL XI 5MM-8MM UNIVERSAL (MISCELLANEOUS)
SOL ANTI FOG 6CC (MISCELLANEOUS) ×1 IMPLANT
SOLUTION ANTI FOG 6CC (MISCELLANEOUS)
SOLUTION ELECTROLUBE (MISCELLANEOUS) ×1 IMPLANT
SPIKE FLUID TRANSFER (MISCELLANEOUS) ×1 IMPLANT
SPONGE T-LAP 18X18 ~~LOC~~+RFID (SPONGE) ×1 IMPLANT
STAPLER CANNULA SEAL DVNC XI (STAPLE) ×1 IMPLANT
STAPLER CANNULA SEAL XI (STAPLE)
SUT MNCRL AB 4-0 PS2 18 (SUTURE) ×4 IMPLANT
SUT PROLENE 2 0 CT2 30 (SUTURE) ×1 IMPLANT
SUT VIC AB 2-0 SH 27 (SUTURE) ×1
SUT VIC AB 2-0 SH 27X BRD (SUTURE) ×1 IMPLANT
SUT VIC AB 4-0 SH 18 (SUTURE) ×1 IMPLANT
SUT VICRYL 0 TIES 12 18 (SUTURE) ×2 IMPLANT
SUT VLOC 180 2-0 6IN GS21 (SUTURE) IMPLANT
SYR 10ML ECCENTRIC (SYRINGE) ×2 IMPLANT
SYR 20ML LL LF (SYRINGE) ×2 IMPLANT
TAPE STRIPS DRAPE STRL (GAUZE/BANDAGES/DRESSINGS) ×1 IMPLANT
TOWEL OR 17X26 10 PK STRL BLUE (TOWEL DISPOSABLE) ×2 IMPLANT
TOWEL OR NON WOVEN STRL DISP B (DISPOSABLE) ×2 IMPLANT
TROCAR ADV FIXATION 12X100MM (TROCAR) IMPLANT
TROCAR BLADELESS OPT 5 100 (ENDOMECHANICALS) ×1 IMPLANT
TUBING INSUFFLATION 10FT LAP (TUBING) ×1 IMPLANT
YANKAUER SUCT BULB TIP 10FT TU (MISCELLANEOUS) ×1 IMPLANT

## 2022-03-08 NOTE — Anesthesia Postprocedure Evaluation (Signed)
Anesthesia Post Note ? ?Patient: Kellie Chisolm ? ?Procedure(s) Performed: OPEN RIGHT RECURRENT INGUINAL HERNIA REPAIR WITH MESH (Right: Abdomen) ? ?  ? ?Patient location during evaluation: PACU ?Anesthesia Type: General ?Level of consciousness: awake and alert ?Pain management: pain level controlled ?Vital Signs Assessment: post-procedure vital signs reviewed and stable ?Respiratory status: spontaneous breathing, nonlabored ventilation and respiratory function stable ?Cardiovascular status: stable and blood pressure returned to baseline ?Anesthetic complications: no ? ? ?No notable events documented. ? ?Last Vitals:  ?Vitals:  ? 03/08/22 1345 03/08/22 1400  ?BP: (!) 126/91 123/85  ?Pulse: 89 88  ?Resp: 16 15  ?Temp:    ?SpO2: 93% 97%  ?  ?Last Pain:  ?Vitals:  ? 03/08/22 1400  ?TempSrc:   ?PainSc: 0-No pain  ? ? ?  ?  ?  ?  ?  ?  ? ?Beryle Lathe ? ? ? ? ?

## 2022-03-08 NOTE — Op Note (Signed)
Sergio Zawislak  25-Apr-1966 ? ? ?03/08/2022 ? ? ? ?PCP:  Eustaquio Boyden, MD ? ? ?Surgeon: Wenda Low, MD, FACS ? ?Asst:  none ? ?Anes:  general ? ?Preop Dx: Right inguinal hernia --recurrent ?Postop Dx: Right indirect inguinal hernia into the scrotum ? ?Procedure: Open right inguinal hernia repair with mesh ?Location Surgery: [facility] ?Complications:  None noted ? ?EBL:   20 cc ? ?Drains: none ? ?Description of Procedure: ? The patient was taken to OR 5 .  After anesthesia was administered and the patient was prepped  with technicare  and a timeout was performed.  Patient had a full scrotum and a large mass and a fairly filled pannus.  An oblique incision was made and carried down through generous fat tissue to the external ring which contained hernia and freed up the external oblique.  It seemed that there was a lot of scar tissue there from possibly an earlier hernia repair that he had mentioned he had gone in 1994.  I incised the external oblique along the fibers and mobilize a very large cord.  Distally there was a hydrocele present as all this came up and I opened that and drained it and allow the testicle to stay in the scrotum.  Proximally there was a very large indirect sac that was about the size of a moderate eggplant.  I ended up sewing the neck closed with a 2-0 Vicryl and then twisting it and tying around to itself and then excising the excess sac.  I push that back up inside the ring. ? ?I reinforced the floor and the ring with a piece of Marlex mesh cut to fit sutured along the inguinal ligament with running 2-0 Prolene and likewise above with a running 2-0 Prolene and I brought around the cord structures and sutured to itself.  I was able to tuck it beneath the external oblique that I which I closed with a little running suture of 0 Vicryl.  Zan relief was then instilled into the wound which was then closed with 4-0 Vicryl subcutaneous and subcuticularly and also with a 4-0 Monocryl.  Dermabond was used on  the skin ? ?The patient tolerated the procedure well and was taken to the PACU in stable condition.   ? ? ?Matt B. Daphine Deutscher, MD, FACS ?[facility], [place] ?[phone]  ?

## 2022-03-08 NOTE — Transfer of Care (Signed)
Immediate Anesthesia Transfer of Care Note ? ?Patient: [name] [name] ? ?Procedure(s) Performed: OPEN RIGHT RECURRENT INGUINAL HERNIA REPAIR WITH MESH (Right: Abdomen) ? ?Patient Location: PACU ? ?Anesthesia Type:General ? ?Level of Consciousness: awake and alert  ? ?Airway & Oxygen Therapy: Patient Spontanous Breathing and Patient connected to face mask oxygen ? ?Post-op Assessment: Report given to RN and Post -op Vital signs reviewed and stable ? ?Post vital signs: Reviewed and stable ? ?Last Vitals:  ?Vitals Value Taken Time  ?BP 131/98 03/08/22 1328  ?Temp    ?Pulse 95 03/08/22 1328  ?Resp 14 03/08/22 1329  ?SpO2 97 % 03/08/22 1328  ?Vitals shown include unvalidated device data. ? ?Last Pain:  ?Vitals:  ? 03/08/22 0929  ?TempSrc:   ?PainSc: 0-No pain  ?   ? ?  ? ?Complications: No notable events documented. ?

## 2022-03-08 NOTE — Anesthesia Procedure Notes (Signed)
Procedure Name: Intubation ?Date/Time: 03/08/2022 11:09 AM ?Performed by: Cynda Familia, CRNA ?Pre-anesthesia Checklist: Patient identified, Emergency Drugs available, Suction available and Patient being monitored ?Patient Re-evaluated:Patient Re-evaluated prior to induction ?Oxygen Delivery Method: Circle System Utilized ?Preoxygenation: Pre-oxygenation with 100% oxygen ?Induction Type: IV induction ?Ventilation: Mask ventilation without difficulty ?Laryngoscope Size: Sabra Heck and 2 ?Grade View: Grade I ?Tube type: Oral ?Number of attempts: 1 ?Airway Equipment and Method: Stylet ?Placement Confirmation: ETT inserted through vocal cords under direct vision, positive ETCO2 and breath sounds checked- equal and bilateral ?Secured at: 22 cm ?Tube secured with: Tape ?Dental Injury: Teeth and Oropharynx as per pre-operative assessment  ?Comments: IV induction Brock -intubation atraumatic-- teeth and mouth as preop-- bilat BS ? ? ? ? ?

## 2022-03-08 NOTE — Interval H&P Note (Signed)
History and Physical Interval Note: ? ?03/08/2022 ?10:28 AM ? ?[name] [name]  has presented today for surgery, with the diagnosis of RIGHT INGUINAL HERNIA.  The various methods of treatment have been discussed with the patient and family. After consideration of risks, benefits and other options for treatment, the patient has consented to  Procedure(s): ?XI ROBOTIC ASSISTED RIGHT SCROTAL HERNIA (Right) as a surgical intervention.  The patient's history has been reviewed, patient examined, no change in status, stable for surgery.  I have reviewed the patient's chart and labs.  Questions were answered to the patient's satisfaction.   ? ? ?Valarie Merino ? ? ?

## 2022-03-08 NOTE — Anesthesia Preprocedure Evaluation (Addendum)
Anesthesia Evaluation  ?Patient identified by MRN, date of birth, ID band ?Patient awake ? ? ? ?Reviewed: ?Allergy & Precautions, NPO status , Patient's Chart, lab work & pertinent test results ? ?History of Anesthesia Complications ?Negative for: history of anesthetic complications ? ?Airway ?Mallampati: I ? ?TM Distance: >3 FB ?Neck ROM: Full ? ? ? Dental ? ?(+) Dental Advisory Given, Teeth Intact ?  ?Pulmonary ?asthma (childhood) ,  ?  ?Pulmonary exam normal ? ? ? ? ? ? ? Cardiovascular ?hypertension, Pt. on medications ?Normal cardiovascular exam ? ? ?  ?Neuro/Psych ?PSYCHIATRIC DISORDERS Anxiety negative neurological ROS ?   ? GI/Hepatic ?Neg liver ROS, GERD  Medicated and Controlled,  ?Endo/Other  ? ?Obesity ? ? Renal/GU ?negative Renal ROS  ? ?  ?Musculoskeletal ? ?(+) Arthritis ,  ? Abdominal ?  ?Peds ? Hematology ? ?(+) Blood dyscrasia, anemia ,   ?Anesthesia Other Findings ? ? Reproductive/Obstetrics ? ?  ? ? ? ? ? ? ? ? ? ? ? ? ? ?  ?  ? ? ? ? ? ? ? ?Anesthesia Physical ?Anesthesia Plan ? ?ASA: 2 ? ?Anesthesia Plan: General  ? ?Post-op Pain Management: Tylenol PO (pre-op)* and Celebrex PO (pre-op)*  ? ?Induction: Intravenous ? ?PONV Risk Score and Plan: 2 and Treatment may vary due to age or medical condition, Ondansetron, Dexamethasone and Midazolam ? ?Airway Management Planned: Oral ETT ? ?Additional Equipment: None ? ?Intra-op Plan:  ? ?Post-operative Plan: Extubation in OR ? ?Informed Consent: I have reviewed the patients History and Physical, chart, labs and discussed the procedure including the risks, benefits and alternatives for the proposed anesthesia with the patient or authorized representative who has indicated his/her understanding and acceptance.  ? ? ? ?Dental advisory given ? ?Plan Discussed with: CRNA and Anesthesiologist ? ?Anesthesia Plan Comments:   ? ? ? ? ? ?Anesthesia Quick Evaluation ? ?

## 2022-03-11 MED ORDER — TRAMADOL HCL 50 MG PO TABS: ORAL_TABLET | ORAL | 0 refills | Status: DC

## 2022-03-11 MED ORDER — METHOCARBAMOL 500 MG PO TABS: 500.0000 mg | ORAL_TABLET | Freq: Two times a day (BID) | ORAL | 3 refills | Status: DC | PRN

## 2022-03-11 NOTE — Telephone Encounter (Signed)
Name of Medication: Tramadol ?Name of Pharmacy: [facility] [address] ?Last Fill or Written Date and Quantity: 01/10/22, #30 ?Last Office Visit and Type: 02/11/22, CPE ?Next Office Visit and Type: none ?Last Controlled Substance Agreement Date: none ?Last UDS: none ? ?Methocarbamol last rx:  12/27/20, #30 ? ? ?

## 2022-03-11 NOTE — Telephone Encounter (Signed)
ERx 

## 2022-03-19 ENCOUNTER — Encounter: Payer: Self-pay | Admitting: Family Medicine

## 2022-03-19 DIAGNOSIS — R1310 Dysphagia, unspecified: Secondary | ICD-10-CM

## 2022-03-19 DIAGNOSIS — R634 Abnormal weight loss: Secondary | ICD-10-CM

## 2022-03-21 NOTE — Addendum Note (Signed)
Addended by: Eustaquio Boyden on: 03/21/2022 12:17 PM ? ? Modules accepted: Orders ? ?

## 2022-03-21 NOTE — Progress Notes (Signed)
Changing barium swallow per [facility] protocol.  ?

## 2022-03-22 ENCOUNTER — Other Ambulatory Visit: Payer: Self-pay | Admitting: Family Medicine

## 2022-03-22 DIAGNOSIS — R1310 Dysphagia, unspecified: Secondary | ICD-10-CM

## 2022-03-22 DIAGNOSIS — R634 Abnormal weight loss: Secondary | ICD-10-CM

## 2022-04-02 NOTE — Telephone Encounter (Signed)
Pt scheduled 04/03/2022 ? ?Nothing further needed.  ? ?

## 2022-04-03 ENCOUNTER — Ambulatory Visit
Admission: RE | Admit: 2022-04-03 | Discharge: 2022-04-03 | Disposition: A | Discharge status: Home / Self Care | Payer: Managed Care, Other (non HMO) | Source: Ambulatory Visit | Attending: Family Medicine | Admitting: Family Medicine

## 2022-04-03 DIAGNOSIS — D500 Iron deficiency anemia secondary to blood loss (chronic): Secondary | ICD-10-CM | POA: Insufficient documentation

## 2022-04-03 DIAGNOSIS — R634 Abnormal weight loss: Secondary | ICD-10-CM | POA: Insufficient documentation

## 2022-04-03 DIAGNOSIS — R1310 Dysphagia, unspecified: Secondary | ICD-10-CM | POA: Diagnosis present

## 2022-04-04 ENCOUNTER — Ambulatory Visit: Payer: Managed Care, Other (non HMO)

## 2022-04-26 ENCOUNTER — Encounter: Payer: Self-pay | Admitting: Family Medicine

## 2022-04-26 MED ORDER — TRAMADOL HCL 50 MG PO TABS: ORAL_TABLET | ORAL | 0 refills | Status: DC

## 2022-04-26 NOTE — Telephone Encounter (Signed)
Name of Medication: Tramadol Name of Pharmacy: [facility] [address] Last Fill or Written Date and Quantity: 03/11/22, #30 Last Office Visit and Type: 02/11/22, CPE Next Office Visit and Type: none Last Controlled Substance Agreement Date: none Last UDS: none

## 2022-04-26 NOTE — Telephone Encounter (Signed)
ERx 

## 2022-04-27 ENCOUNTER — Encounter: Payer: Self-pay | Admitting: Family Medicine

## 2022-05-02 ENCOUNTER — Encounter: Payer: Self-pay | Admitting: Family Medicine

## 2022-05-02 DIAGNOSIS — R0981 Nasal congestion: Secondary | ICD-10-CM

## 2022-05-02 DIAGNOSIS — R634 Abnormal weight loss: Secondary | ICD-10-CM

## 2022-05-02 DIAGNOSIS — K219 Gastro-esophageal reflux disease without esophagitis: Secondary | ICD-10-CM

## 2022-05-02 DIAGNOSIS — D500 Iron deficiency anemia secondary to blood loss (chronic): Secondary | ICD-10-CM

## 2022-05-02 DIAGNOSIS — R1310 Dysphagia, unspecified: Secondary | ICD-10-CM

## 2022-05-02 NOTE — Telephone Encounter (Signed)
Not sure why this came to me, I do not see an active referral on this patient.

## 2022-05-02 NOTE — Telephone Encounter (Signed)
Sorry about that, [name] [name].

## 2022-05-03 NOTE — Telephone Encounter (Signed)
See subsequent mychart message.

## 2022-05-03 NOTE — Addendum Note (Signed)
Addended by: Eustaquio Boyden on: 05/03/2022 05:46 PM   Modules accepted: Orders

## 2022-05-06 ENCOUNTER — Encounter: Payer: Self-pay | Admitting: Gastroenterology

## 2022-05-14 ENCOUNTER — Telehealth: Payer: Self-pay

## 2022-05-17 NOTE — Telephone Encounter (Signed)
Faxed forms and will mail a copy to patient

## 2022-05-17 NOTE — Telephone Encounter (Signed)
Filled and in Lisa's box 

## 2022-05-22 NOTE — Telephone Encounter (Signed)
Faxed forms with corrected date, per patient, to F # [phone].  Made a copy for myself, scanning, and for patient (mailed out).

## 2022-05-22 NOTE — Telephone Encounter (Signed)
Patient had this form faxed to [place] again.  He stated he put the wrong date on the form.  I have marked out the date and put the correct date of 05/23/22.  Please initial and sign at tags on papers and return to me so I can fax those updates forms.  Forms placed in your inbox.

## 2022-05-22 NOTE — Telephone Encounter (Signed)
Filled and returned to Kate.  

## 2022-06-19 ENCOUNTER — Ambulatory Visit (INDEPENDENT_AMBULATORY_CARE_PROVIDER_SITE_OTHER): Payer: Managed Care, Other (non HMO) | Admitting: Gastroenterology

## 2022-06-19 ENCOUNTER — Other Ambulatory Visit (INDEPENDENT_AMBULATORY_CARE_PROVIDER_SITE_OTHER): Payer: Managed Care, Other (non HMO)

## 2022-06-19 ENCOUNTER — Other Ambulatory Visit: Payer: Self-pay

## 2022-06-19 VITALS — BP 124/64 | HR 84 | Ht 67.0 in | Wt 208.0 lb

## 2022-06-19 DIAGNOSIS — K219 Gastro-esophageal reflux disease without esophagitis: Secondary | ICD-10-CM

## 2022-06-19 DIAGNOSIS — R1310 Dysphagia, unspecified: Secondary | ICD-10-CM | POA: Diagnosis not present

## 2022-06-19 DIAGNOSIS — K641 Second degree hemorrhoids: Secondary | ICD-10-CM | POA: Diagnosis not present

## 2022-06-19 DIAGNOSIS — D509 Iron deficiency anemia, unspecified: Secondary | ICD-10-CM

## 2022-06-19 DIAGNOSIS — R634 Abnormal weight loss: Secondary | ICD-10-CM | POA: Diagnosis not present

## 2022-06-19 DIAGNOSIS — R935 Abnormal findings on diagnostic imaging of other abdominal regions, including retroperitoneum: Secondary | ICD-10-CM | POA: Diagnosis not present

## 2022-06-19 LAB — CBC WITH DIFFERENTIAL/PLATELET
Basophils Absolute: 0.1 10*3/uL (ref 0.0–0.1)
Basophils Relative: 0.6 % (ref 0.0–3.0)
Eosinophils Absolute: 0.1 10*3/uL (ref 0.0–0.7)
Eosinophils Relative: 0.6 % (ref 0.0–5.0)
HCT: 34 % — ABNORMAL LOW (ref 39.0–52.0)
Hemoglobin: 11.3 g/dL — ABNORMAL LOW (ref 13.0–17.0)
Lymphocytes Relative: 14.5 % (ref 12.0–46.0)
Lymphs Abs: 1.2 10*3/uL (ref 0.7–4.0)
MCHC: 33.1 g/dL (ref 30.0–36.0)
MCV: 79.9 fl (ref 78.0–100.0)
Monocytes Absolute: 0.9 10*3/uL (ref 0.1–1.0)
Monocytes Relative: 10.6 % (ref 3.0–12.0)
Neutro Abs: 6.1 10*3/uL (ref 1.4–7.7)
Neutrophils Relative %: 73.7 % (ref 43.0–77.0)
Platelets: 307 10*3/uL (ref 150.0–400.0)
RBC: 4.25 Mil/uL (ref 4.22–5.81)
RDW: 13.8 % (ref 11.5–15.5)
WBC: 8.2 10*3/uL (ref 4.0–10.5)

## 2022-06-19 LAB — IBC + FERRITIN
Ferritin: 9.9 ng/mL — ABNORMAL LOW (ref 22.0–322.0)
Iron: 33 ug/dL — ABNORMAL LOW (ref 42–165)
Saturation Ratios: 6.9 % — ABNORMAL LOW (ref 20.0–50.0)
TIBC: 480.2 ug/dL — ABNORMAL HIGH (ref 250.0–450.0)
Transferrin: 343 mg/dL (ref 212.0–360.0)

## 2022-06-19 MED ORDER — FERROUS SULFATE 325 (65 FE) MG PO TABS: ORAL_TABLET | ORAL | Status: DC

## 2022-06-19 NOTE — Progress Notes (Signed)
[name] :  56 year old male with a history of iron deficiency [name], [name] [name], [name] [name], [name] [name], [name] [name], [name] by Dr. Eustaquio Boyden.  I last saw him in July 2018.  Recall he has had longstanding iron deficiency [name].  Back in 2018 I recommended an EGD and colonoscopy.  He proceed with a colonoscopy but he did not want an EGD at that time.  His colonoscopy showed no concerning polyps or mass lesions, he had large [name] [name] noted.  He has had a history of rectal bleeding from his [name], we had discussed hemorrhoid banding at the time but he did not follow-up for treatment.  I have not seen him in about 5 years.  He continues to have iron deficiency [name].  Takes iron pills once every other day or so for this.  Has required IV iron infusions this past year which he responded well to.  His labs were last done and in April, his hemoglobin was 12.9, improved from nadir of 9.8 about 11 months ago.  He does have rectal bleeding a few days a week.  He states the bleeding can be significant from his [name] when he sees it.  He has occasional hard stools.  Has been using Anusol as needed for his [name].  Has occasional constipation no diarrhea.  Is not using any NSAIDs.  He otherwise endorses [name] [name] of about 30 to 40 pounds over the past year.  He attributes this to anxiety related to medical conditions of his wife which he worries about.  He states he loses his appetite thinking about her [name] and simply just does not eat as much as he used to.  He has some reflux that can bother him occasionally.  He uses Pepcid as needed which works well for him.  He has been having some [name] [name] for the past month or so.  States particularly when he eats rice he can get caught up in his throat or upper esophagus.  It only happens with solids.  He denies any odynophagia.  He had a barium swallow which did not show any high-grade  stenosis or stricture.  He is eating about 3 meals a day but states he eats less than usual due to poor appetite.  He denies any early satiety.  Denies any abdominal pains.  He did have a CT scan of his abdomen and pelvis in May 2021 for abdominal pain and right inguinal pain.  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  There was not any mass lesion or adenopathy but radiology suggested a possible CT scan 6 months later to assess stability.  He never had that done.  Denies any fevers or night sweats.  Denies any cardiopulmonary symptoms otherwise, feeling well today.  No known FH of colon cancer.   Colonoscopy 12/21/2012 - [name], diverticulosis  Colonoscopy 06/13/17: The perianal exam findings include non-thrombosed external [name]. - The terminal ileum appeared normal. - A few small-mouthed diverticula were found in the ascending colon. - [name] [name] were found during retroflexion. The [name] were large. - The exam was otherwise without abnormality.  Barium swallow 04/13/22: IMPRESSION: 1. Normal pharyngeal anatomy and motility. 2.  No evidence of esophageal stricture. 3. Mild esophageal dysmotility is seen with associated tertiary contractions. 4.  No hiatal hernia seen. 5.  No evidence of gastroesophageal reflux seen.    Past Medical History:  Diagnosis Date   [name]  Anxiety    Arthritis    Childhood asthma    Diverticulosis    GERD (gastroesophageal reflux disease)    History of kidney stones    HLD (hyperlipidemia)    no meds   HTN (hypertension)    Iron deficiency [name]    s/p unrevealing colonoscopy 12/2012 [name] back to GI 2015   Obesity, unspecified      Past Surgical History:  Procedure Laterality Date   COLONOSCOPY  12/2012   int hem, diverticulosis (Iftikhar)   COLONOSCOPY  05/2017   inflamed int hem possible source of IDA, offered EGD to patient, diverticulosis  (Olaoluwa Grieder)   HERNIA REPAIR  1994   Family History  Problem Relation Age of Onset   Cervical cancer Mother    Healthy Father    Coronary artery disease Maternal Grandfather 23       MI   Hypertension Neg Hx    Hyperlipidemia Neg Hx    Diabetes Neg Hx    Stroke Neg Hx    Stomach cancer Neg Hx    Colon cancer Neg Hx    Esophageal cancer Neg Hx    Rectal cancer Neg Hx    Social History   Tobacco Use   Smoking status: Never   Smokeless tobacco: Never  Vaping Use   Vaping Use: Never used  Substance Use Topics   Alcohol use: No   Drug use: No   Current Outpatient Medications  Medication Sig Dispense Refill   acetaminophen (TYLENOL) 500 MG tablet Take 2 tablets (1,000 mg total) by mouth 3 (three) times daily as needed for moderate pain.     amLODipine (NORVASC) 10 MG tablet Take 1 tablet (10 mg total) by mouth daily. 90 tablet 3   diclofenac Sodium (VOLTAREN) 1 % GEL Apply 2 g topically 3 (three) times daily. (Patient taking differently: Apply 2 g topically daily as needed (pain).) 100 g 3   docusate sodium (COLACE) 100 MG capsule Take 1 capsule (100 mg total) by mouth daily. (Patient taking differently: Take 100 mg by mouth daily as needed for mild constipation or moderate constipation.)     famotidine (PEPCID) 20 MG tablet Take 20 mg by mouth daily as needed for indigestion.     ferrous sulfate 325 (65 FE) MG tablet Take 1 tablet (325 mg total) by mouth every other day. (Patient taking differently: Take 325 mg by mouth 2 (two) times a week.)     hydrochlorothiazide (HYDRODIURIL) 25 MG tablet Take 1 tablet (25 mg total) by mouth daily. 90 tablet 3   HYDROcodone-acetaminophen (NORCO/VICODIN) 5-325 MG tablet Take 1 tablet by mouth every 6 (six) hours as needed for moderate pain. 20 tablet 0   hydrocortisone (ANUSOL-HC) 25 MG suppository Place 1 suppository (25 mg total) rectally 2 (two) times daily. (Patient taking differently: Place 25 mg rectally 2 (two) times daily as needed for  [name].) 12 suppository 0   hydrOXYzine (ATARAX/VISTARIL) 25 MG tablet Take 0.5-1 tablets (12.5-25 mg total) by mouth 2 (two) times daily as needed for anxiety (sedation precautions). 30 tablet 1   methocarbamol (ROBAXIN) 500 MG tablet Take 1 tablet (500 mg total) by mouth 2 (two) times daily as needed for muscle spasms (sedation precautions). 30 tablet 3   traMADol (ULTRAM) 50 MG tablet TAKE 1 TABLET BY MOUTH EVERY 12 HOURS AS NEEDED FOR MODERATE PAIN 30 tablet 0   No current facility-administered medications for this visit.   Allergies  Allergen Reactions   Peanut-Containing Drug Products  REACTION: swelling     Review of Systems: All systems reviewed and negative except where noted in [name].      Latest Ref Rng & Units 03/01/2022    9:18 AM 01/29/2022    4:44 PM 07/06/2021    8:46 AM  CBC  WBC 4.0 - 10.5 K/uL 7.0  5.6  6.3   Hemoglobin 13.0 - 17.0 g/dL 67.2  09.4  9.8   Hematocrit 39.0 - 52.0 % 39.5  38.4  31.5   Platelets 150 - 400 K/uL 261  287  361     Lab Results  Component Value Date   IRON 58 01/29/2022   TIBC 311 01/29/2022   FERRITIN 97 01/29/2022     Physical Exam: BP 124/64   Pulse 84   Ht 5\' 7"  (1.702 m)   Wt 208 lb (94.3 kg)   BMI 32.58 kg/m  Constitutional: Pleasant,well-developed, male in no acute distress. HEENT: Normocephalic and atraumatic. Conjunctivae are normal. No scleral icterus. Neck supple.  Cardiovascular: Normal rate, regular rhythm.  Pulmonary/chest: Effort normal and breath sounds normal. No wheezing, rales or rhonchi. Abdominal: Soft, nondistended, nontender.  There are no masses palpable.  DRE / Anoscopy - large [name] [name] in all positions, most prominent on R lateral side, no mass lesions, RP hemorrhoid banded, CMA standby Extremities: no edema Lymphadenopathy: No cervical adenopathy noted. Neurological: Alert and oriented to person place and time. Skin: Skin is warm and dry. No rashes  noted. Psychiatric: Normal mood and affect. Behavior is normal.   ASSESSMENT: 56 y.o. male [name] for assessment of the following  1. Iron deficiency [name], unspecified iron deficiency [name] type   2. Grade II [name]   3. [name] [name], unspecified type   4. Gastroesophageal reflux disease without esophagitis   5. Abnormal abdominal CT scan   6. [name] of [name]    History as above.  Longstanding iron deficiency [name] with a negative colonoscopy for this issue in 2018.  EGD was recommended at that time but he did not want to have it done then.  He does have significant rectal bleeding from his [name] fairly frequently, this may likely be the source of his iron deficiency [name].  We discussed in general how to treat [name].  He is using Anusol periodically which has not really provided much benefit.  I discussed interventional therapies to include banding and surgery with him.  I think he is a good candidate for hemorrhoid banding and after discussion today he wanted to proceed with that, RP hemorrhoid was banded.  He will follow-up in 2 to 4 weeks for another band to complete the series of 3 bands, hopefully this will minimize his bleeding symptoms.  I recommend he take a fiber supplement daily in the interim to keep stool soft and prevent constipation.  Otherwise given his [name] [name] and iron deficiency, EGD is recommended to clear his upper tract and make sure okay.  He has been having some [name] [name] at times as well and will evaluate and treat that with dilation if needed.  I discussed EGD and anesthesia with him, risk benefits, he wants to proceed.  He will continue Pepcid as needed for reflux which seems well controlled.  We otherwise reviewed his [name] [name] at length.  Very likely could be due to his decreased appetite in the setting of anxiety, however his prior CT scan was reviewed and raise question of sclerosing mesenteritis.  Given his profound [name] [name] I think a repeat  CT  scan abdomen pelvis is reasonable to reassess this area.  He is in agreement with that and that will be scheduled.  Otherwise now that he has been off oral iron routinely status post IV iron infusion, we will repeat his CBC with iron studies to see where he was trending, may need to take iron more routinely or if he is intolerant can do another IV iron infusion.    PLAN: - RP hemorrhoid banded today - repeat CBC and TIBC ferritin today - daily fiber supplement - Citrucel or Metamucil - repeat hemorrhoid banding in 2-4 weeks - schedule EGD in the [facility] - anticipate dilation for symptoms of [name] [name] - schedule CT scan abdomen / pelvis - continue Pepcid PRN for reflux  Harlin Rain, MD [facility]  CC: Eustaquio Boyden, MD     PROCEDURE NOTE: The patient presents with symptomatic grade II  [name], requesting rubber band ligation of his/her hemorrhoidal disease.  All risks, benefits and alternative forms of therapy were described and informed consent was obtained. Risks include bleeding, pain, etc.  In the Left Lateral Decubitus position anoscopic examination revealed grade II [name] in the all position(s) - largest in R lateral side  The anorectum was pre-medicated with 0.125% nitroglycerin ointment The decision was made to band the RP [name] hemorrhoid, and the [facility] System was used to perform band ligation without complication.  Digital anorectal examination was then performed to assure proper positioning of the band, and to adjust the banded tissue as required.  The patient was discharged home without pain or other [name].  Dietary and behavioral recommendations were given and along with follow-up instructions.     The following adjunctive treatments were recommended: Daily fiber supplement  The patient will return in 2-4 weeks for  follow-up and possible additional banding as required. No complications were encountered and the patient  tolerated the procedure well.

## 2022-06-19 NOTE — Patient Instructions (Addendum)
If you are age 57 or older, your body mass index should be between 23-30. Your Body mass index is 32.58 kg/m. If this is out of the aforementioned range listed, please consider follow up with your Primary Care Provider.  If you are age 68 or younger, your body mass index should be between 19-25. Your Body mass index is 32.58 kg/m. If this is out of the aformentioned range listed, please consider follow up with your Primary Care Provider.   ________________________________________________________  The [facility] providers would like to encourage you to use [facility] to communicate with providers for non-urgent requests or questions.  Due to long hold times on the telephone, sending your provider a message by [facility] may be a faster and more efficient way to get a response.  Please allow 48 business hours for a response.  Please remember that this is for non-urgent requests.  _______________________________________________________  [name] [name] have been scheduled for an endoscopy. Please follow written instructions given to you at your visit today. If you use inhalers (even only as needed), please bring them with you on the day of your procedure. ______________________________________________________________________________  You have been scheduled for a CT scan of the abdomen and pelvis at [facility], 1st floor Radiology. You are scheduled on Friday, 06-21-22 at 7:00 pm. You should arrive 30 minutes prior to your appointment time for registration.  We are giving you 2 bottles of contrast today that you will need to drink before arriving for the exam. The solution may taste better if refrigerated so put them in the refrigerator when you get home, but do NOT add ice or any other liquid to this solution as that would dilute it. Shake well before drinking.   Please follow the written instructions below on the day of your exam:   1) Do not eat anything after 3:00pm (4 hours prior to your test)   2)  Drink 1 bottle of contrast @ 5:00pm (2 hours prior to your exam)  Remember to shake well before drinking and do NOT pour over ice.     Drink 1 bottle of contrast @ 6:00pm (1 hour prior to your exam)   You may take any medications as prescribed with a small amount of water, if necessary. If you take any of the following medications: METFORMIN, GLUCOPHAGE, GLUCOVANCE, AVANDAMET, RIOMET, FORTAMET, [place] MET, JANUMET, GLUMETZA or METAGLIP, you MAY be asked to HOLD this medication 48 hours AFTER the exam.   The purpose of you drinking the oral contrast is to aid in the visualization of your intestinal tract. The contrast solution may cause some diarrhea. Depending on your individual set of symptoms, you may also receive an intravenous injection of x-ray contrast/dye. Plan on being at [facility] for 45 minutes or longer, depending on the type of exam you are having performed.   If you have any questions regarding your exam or if you need to reschedule, you may call [facility] at [phone] between the hours of 8:00 am and 5:00 pm, Monday-Friday. ______________________________________________________________________________  Please go to the lab in the basement of our building to have lab work done as you leave today. Hit "B" for basement when you get on the elevator.  When the doors open the lab is on your left.  We will call you with the results. Thank you.  You have been scheduled for a 2nd banding appointment on Thursday, 07-11-22 at 4:00pm.  Please purchase the following medications over the counter and take as directed:  Citrucel: Take once daily  HEMORRHOID BANDING PROCEDURE    FOLLOW-UP CARE   The procedure you have had should have been relatively painless since the banding of the area involved does not have nerve endings and there is no pain sensation.  The rubber band cuts off the blood supply to the hemorrhoid and the band may fall off as soon as 48 hours after the banding  (the band may occasionally be seen in the toilet bowl following a bowel movement). You may notice a temporary feeling of fullness in the rectum which should respond adequately to plain Tylenol or Motrin.  Following the banding, avoid strenuous exercise that evening and resume full activity the next day.  A sitz bath (soaking in a warm tub) or bidet is soothing, and can be useful for cleansing the area after bowel movements.     To avoid constipation, take two tablespoons of natural wheat bran, natural oat bran, flax, Benefiber or any over the counter fiber supplement and increase your water intake to 7-8 glasses daily.    Unless you have been prescribed anorectal medication, do not put anything inside your rectum for two weeks: No suppositories, enemas, fingers, etc.  Occasionally, you may have more bleeding than usual after the banding procedure.  This is often from the untreated hemorrhoids rather than the treated one.  Don't be concerned if there is a tablespoon or so of blood.  If there is more blood than this, lie flat with your bottom higher than your head and apply an ice pack to the area. If the bleeding does not stop within a half an hour or if you feel faint, call our office at [phone] or go to the emergency room.  Problems are not common; however, if there is a substantial amount of bleeding, severe pain, chills, fever or difficulty passing urine (very rare) or other problems, you should call us at [phone] or report to the nearest emergency room.  Do not stay seated continuously for more than 2-3 hours for a day or two after the procedure.  Tighten your buttock muscles 10-15 times every two hours and take 10-15 deep breaths every 1-2 hours.  Do not spend more than a few minutes on the toilet if you cannot empty your bowel; instead re-visit the toilet at a later time.     Thank you for entrusting me with your care and for choosing [facility], Dr. Athena Cellar

## 2022-06-21 ENCOUNTER — Ambulatory Visit (HOSPITAL_COMMUNITY)
Admission: RE | Admit: 2022-06-21 | Discharge: 2022-06-21 | Disposition: A | Discharge status: Home / Self Care | Payer: Managed Care, Other (non HMO) | Source: Ambulatory Visit | Attending: Gastroenterology | Admitting: Gastroenterology

## 2022-06-21 DIAGNOSIS — K219 Gastro-esophageal reflux disease without esophagitis: Secondary | ICD-10-CM | POA: Insufficient documentation

## 2022-06-21 DIAGNOSIS — R634 Abnormal weight loss: Secondary | ICD-10-CM | POA: Diagnosis present

## 2022-06-21 DIAGNOSIS — R935 Abnormal findings on diagnostic imaging of other abdominal regions, including retroperitoneum: Secondary | ICD-10-CM | POA: Insufficient documentation

## 2022-06-21 DIAGNOSIS — D509 Iron deficiency anemia, unspecified: Secondary | ICD-10-CM | POA: Insufficient documentation

## 2022-06-21 MED ORDER — IOHEXOL 300 MG/ML  SOLN
100.0000 mL | Freq: Once | INTRAMUSCULAR | Status: AC | PRN
  Administered 2022-06-21: 100 mL via INTRAVENOUS

## 2022-06-24 ENCOUNTER — Other Ambulatory Visit: Payer: Self-pay | Admitting: Family Medicine

## 2022-06-24 MED ORDER — TRAMADOL HCL 50 MG PO TABS: ORAL_TABLET | ORAL | 0 refills | Status: DC

## 2022-06-24 NOTE — Telephone Encounter (Signed)
ERx 

## 2022-06-24 NOTE — Telephone Encounter (Signed)
Name of Medication: Tramadol Name of Pharmacy: [facility] [address]  Last Fill or Written Date and Quantity: 04/26/22, #30 Last Office Visit and Type: 02/11/22, CPE Next Office Visit and Type: 08/07/22, 6 mo iron infusion f/u Last Controlled Substance Agreement Date: none Last UDS: none

## 2022-07-04 ENCOUNTER — Encounter: Payer: Self-pay | Admitting: Family Medicine

## 2022-07-05 MED ORDER — HYDROXYZINE HCL 25 MG PO TABS: 12.5000 mg | ORAL_TABLET | Freq: Two times a day (BID) | ORAL | 1 refills | Status: DC | PRN

## 2022-07-10 ENCOUNTER — Encounter: Payer: Self-pay | Admitting: Gastroenterology

## 2022-07-10 ENCOUNTER — Ambulatory Visit: Payer: Managed Care, Other (non HMO) | Admitting: Gastroenterology

## 2022-07-10 VITALS — BP 124/75 | HR 72 | Temp 97.8°F | Resp 15 | Ht 67.0 in | Wt 208.0 lb

## 2022-07-10 DIAGNOSIS — D509 Iron deficiency anemia, unspecified: Secondary | ICD-10-CM

## 2022-07-10 DIAGNOSIS — K2960 Other gastritis without bleeding: Secondary | ICD-10-CM | POA: Diagnosis not present

## 2022-07-10 DIAGNOSIS — K219 Gastro-esophageal reflux disease without esophagitis: Secondary | ICD-10-CM

## 2022-07-10 DIAGNOSIS — R1310 Dysphagia, unspecified: Secondary | ICD-10-CM

## 2022-07-10 DIAGNOSIS — R634 Abnormal weight loss: Secondary | ICD-10-CM | POA: Diagnosis not present

## 2022-07-10 DIAGNOSIS — K449 Diaphragmatic hernia without obstruction or gangrene: Secondary | ICD-10-CM

## 2022-07-10 DIAGNOSIS — K319 Disease of stomach and duodenum, unspecified: Secondary | ICD-10-CM | POA: Diagnosis not present

## 2022-07-10 HISTORY — PX: UPPER GI ENDOSCOPY: SHX6162

## 2022-07-10 MED ORDER — SODIUM CHLORIDE 0.9 % IV SOLN: 500.0000 mL | INTRAVENOUS | Status: DC

## 2022-07-10 MED ORDER — OMEPRAZOLE 40 MG PO CPDR: 40.0000 mg | DELAYED_RELEASE_CAPSULE | Freq: Every day | ORAL | 1 refills | Status: DC

## 2022-07-10 NOTE — Progress Notes (Signed)
Sedate, gd SR, tolerated procedure well, VSS, report to RN 

## 2022-07-10 NOTE — Patient Instructions (Signed)
Handouts on gastritis and hiatal hernia given to patient.  Await pathology results. Stop Pepcid and start Omeprazole 40 mg a day - pick up from [facility] pharmacy  Resume previous diet and continue present medications   YOU HAD AN ENDOSCOPIC PROCEDURE TODAY AT THE [facility]:   Refer to the procedure report that was given to you for any specific questions about what was found during the examination.  If the procedure report does not answer your questions, please call your gastroenterologist to clarify.  If you requested that your care partner not be given the details of your procedure findings, then the procedure report has been included in a sealed envelope for you to review at your convenience later.  YOU SHOULD EXPECT: Some feelings of bloating in the abdomen. Passage of more gas than usual.  Walking can help get rid of the air that was put into your GI tract during the procedure and reduce the bloating. If you had a lower endoscopy (such as a colonoscopy or flexible sigmoidoscopy) you may notice spotting of blood in your stool or on the toilet paper. If you underwent a bowel prep for your procedure, you may not have a normal bowel movement for a few days.  Please Note:  You might notice some irritation and congestion in your nose or some drainage.  This is from the oxygen used during your procedure.  There is no need for concern and it should clear up in a day or so.  SYMPTOMS TO REPORT IMMEDIATELY:  Following upper endoscopy (EGD)  Vomiting of blood or coffee ground material  New chest pain or pain under the shoulder blades  Painful or persistently difficult swallowing  New shortness of breath  Fever of 100F or higher  Black, tarry-looking stools  For urgent or emergent issues, a gastroenterologist can be reached at any hour by calling [phone]. Do not use MyChart messaging for urgent concerns.    DIET:  We do recommend a small meal at first, but then you may  proceed to your regular diet.  Drink plenty of fluids but you should avoid alcoholic beverages for 24 hours.  ACTIVITY:  You should plan to take it easy for the rest of today and you should NOT DRIVE or use heavy machinery until tomorrow (because of the sedation medicines used during the test).    FOLLOW UP: Our staff will call the number listed on your records the next business day following your procedure.  We will call around 7:15- 8:00 am to check on you and address any questions or concerns that you may have regarding the information given to you following your procedure. If we do not reach you, we will leave a message.  If you develop any symptoms (ie: fever, flu-like symptoms, shortness of breath, cough etc.) before then, please call [phone].  If you test positive for Covid 19 in the 2 weeks post procedure, please call and report this information to [place].    If any biopsies were taken you will be contacted by phone or by letter within the next 1-3 weeks.  Please call us at [phone] if you have not heard about the biopsies in 3 weeks.    SIGNATURES/CONFIDENTIALITY: You and/or your care partner have signed paperwork which will be entered into your electronic medical record.  These signatures attest to the fact that that the information above on your After Visit Summary has been reviewed and is understood.  Full responsibility of the confidentiality of  this discharge information lies with you and/or your care-partner.

## 2022-07-10 NOTE — Progress Notes (Signed)
History and Physical Interval Note: Patient seen on 06/19/22 - see that note for details. He has since had a CT Scan which showed no cause for weight loss or IDA. EGD scheduled today to evaluate IDA, weight loss, dysphagia. Colonoscopy previously negative for concerning pathology for IDA in the past, although he does have hemorrhoidal bleeding and is s/p a banding. Otherwise no new interval changes since I have seen him. I have discussed risks / benefits of the exam and anesthesia and he wishes to proceed.  07/10/2022 10:24 AM  [name] [name]  has presented today for endoscopic procedure(s), with the diagnosis of  Encounter Diagnoses  Name Primary?   Iron deficiency anemia, unspecified iron deficiency anemia type Yes   Dysphagia, unspecified type    Loss of weight   .  The various methods of evaluation and treatment have been discussed with the patient and/or family. After consideration of risks, benefits and other options for treatment, the patient has consented to  the endoscopic procedure(s).   The patient's history has been reviewed, patient examined, no change in status, stable for surgery.  I have reviewed the patient's chart and labs.  Questions were answered to the patient's satisfaction.    Harlin Rain, MD [facility]enter[facility]

## 2022-07-10 NOTE — Op Note (Signed)
[facility] Patient Name: [name] [name] Procedure Date: 07/10/2022 10:30 AM MRN: [id number] Endoscopist: Viviann Spare P. Adela Lank , MD Age: 56 Referring MD:  Date of Birth: Nov 04, 1966 Gender: Male Account #: [id number] Procedure:                Upper GI endoscopy Indications:              Iron deficiency anemia, Dysphagia, Weight loss -                            colonoscopy without concerning pathology for IDA a                            few years ago, bleeding from hemorrhoids s/p                            banding which has improved. CT negative for cause                            of weight loss. On pepcid Medicines:                Monitored Anesthesia Care Procedure:                Pre-Anesthesia Assessment:                           - Prior to the procedure, a History and Physical                            was performed, and patient medications and                            allergies were reviewed. The patient's tolerance of                            previous anesthesia was also reviewed. The risks                            and benefits of the procedure and the sedation                            options and risks were discussed with the patient.                            All questions were answered, and informed consent                            was obtained. Prior Anticoagulants: The patient has                            taken no previous anticoagulant or antiplatelet                            agents. ASA Grade Assessment: II - A patient with  mild systemic disease. After reviewing the risks                            and benefits, the patient was deemed in                            satisfactory condition to undergo the procedure.                           After obtaining informed consent, the endoscope was                            passed under direct vision. Throughout the                            procedure, the patient's blood  pressure, pulse, and                            oxygen saturations were monitored continuously. The                            GIF [id number] #[id number] was introduced through the                            mouth, and advanced to the second part of duodenum.                            The upper GI endoscopy was accomplished without                            difficulty. The patient tolerated the procedure                            well. Scope In: Scope Out: Findings:                 Esophagogastric landmarks were identified: the                            Z-line was found at 36 cm, the gastroesophageal                            junction was found at 36 cm and the upper extent of                            the gastric folds was found at 39 cm from the                            incisors.                           A 3 cm hiatal hernia was present.                           Mucosal changes characterized by  slight nodularity                            were found at the gastroesophageal junction,                            suspect inflammatory due to reflux. Biopsies were                            taken with a cold forceps for histology.                           The exam of the esophagus was otherwise normal. No                            stenosis / stricture appreciated.                           A guidewire was placed and the scope was withdrawn.                            Empiric dilation was performed in the entire                            esophagus with a Savary dilator with mild                            resistance at 17 mm. Relook endoscopy showed no                            mucosal wrents.                           Patchy mild inflammation characterized by a few                            small erosions, erythema and granularity was found                            in the gastric antrum. Biopsies were taken with a                            cold forceps for Helicobacter pylori  testing.                           The exam of the stomach was otherwise normal.                           The examined duodenum was normal. Complications:            No immediate complications. Estimated blood loss:                            Minimal. Estimated Blood Loss:     Estimated blood loss was minimal. Impression:               -  Esophagogastric landmarks identified.                           - 3 cm hiatal hernia.                           - Altered mucosa in the GEJ - suspect inflammatory                            changes, biopsied to ensure no dysplasia.                           - Normal esophagus otherwise - empiric dilation                            performed to 87mm                           - Gastritis. Biopsied.                           - Normal examined duodenum. Recommendation:           - Patient has a contact number available for                            emergencies. The signs and symptoms of potential                            delayed complications were discussed with the                            patient. Return to normal activities tomorrow.                            Written discharge instructions were provided to the                            patient.                           - Resume previous diet.                           - Continue present medications.                           - Stop pepcid                           - Start omeprazole 40mg  / day                           - Await pathology results with further                            recommendations P. Toccara Alford, MD 07/10/2022 10:55:00 AM This report has been signed electronically.

## 2022-07-10 NOTE — Progress Notes (Signed)
Called to room to assist during endoscopic procedure.  Patient ID and intended procedure confirmed with present staff. Received instructions for my participation in the procedure from the performing physician.  

## 2022-07-11 ENCOUNTER — Ambulatory Visit: Payer: Managed Care, Other (non HMO) | Admitting: Gastroenterology

## 2022-07-11 ENCOUNTER — Encounter: Payer: Self-pay | Admitting: Gastroenterology

## 2022-07-11 ENCOUNTER — Telehealth: Payer: Self-pay | Admitting: *Deleted

## 2022-07-11 VITALS — BP 100/80 | HR 96 | Ht 67.0 in | Wt 210.4 lb

## 2022-07-11 DIAGNOSIS — D509 Iron deficiency anemia, unspecified: Secondary | ICD-10-CM

## 2022-07-11 DIAGNOSIS — K641 Second degree hemorrhoids: Secondary | ICD-10-CM

## 2022-07-11 NOTE — Patient Instructions (Signed)

## 2022-07-11 NOTE — Telephone Encounter (Signed)
  Follow up Call-     07/10/2022    9:12 AM  Call back number  Post procedure Call Back phone  # [phone]  Permission to leave phone message Yes     Patient questions:  Do you have a fever, pain , or abdominal swelling? No. Pain Score  0 *  Have you tolerated food without any problems? Yes.    Have you been able to return to your normal activities? Yes.    Do you have any questions about your discharge instructions: Diet   No. Medications  No. Follow up visit  No.  Do you have questions or concerns about your Care? No.  Actions: * If pain score is 4 or above: No action needed, pain <4.

## 2022-07-11 NOTE — Progress Notes (Addendum)
56 year old male here for a follow-up visit for hemorrhoid banding.  Recall he has symptomatic hemorrhoids with frequent bleeding, I suspect related to his iron deficiency anemia.  I banded RP hemorrhoid on 06/19/22.  He tolerated it well.  He has noticed a decrease in his bleeding symptoms since that happened but it has not resolved, he continues to have some intermittent bleeding.  I discussed options with him and he wants to proceed with further banding today following discussion of risks and benefits.  PROCEDURE NOTE: The patient presents with symptomatic grade II  hemorrhoids, requesting rubber band ligation of his/her hemorrhoidal disease.  All risks, benefits and alternative forms of therapy were described and informed consent was obtained.   The anorectum was pre-medicated with 0.125% nitroglycerin The decision was made to band the LL internal hemorrhoid, and the hemorrhoid bander was used to perform band ligation without complication.  Digital anorectal examination was then performed to assure proper positioning of the band, and to adjust the banded tissue as required.  The patient was discharged home without pain or other issues.  Dietary and behavioral recommendations were given and along with follow-up instructions.     The following adjunctive treatments were recommended: Daily fiber supplement, avoid straining  The patient will return in 2-4 for  follow-up and possible additional banding as required. No complications were encountered and the patient tolerated the procedure well. Written script for CBC and TIBC / ferritin for follow up of anemia while on iron and post banding. He will have this done prior to his next appointment.  Harlin Rain, MD [facility]enter[facility]

## 2022-08-07 ENCOUNTER — Ambulatory Visit: Payer: Managed Care, Other (non HMO) | Admitting: Family Medicine

## 2022-08-08 ENCOUNTER — Encounter: Payer: Managed Care, Other (non HMO) | Admitting: Gastroenterology

## 2022-08-09 IMAGING — RF DG ESOPHAGUS
9 series · 14 of 24 positions shown · non-contrast
Comparison: CT abdomen pelvis 04/14/2020.

INDICATION: Patient complains of intermittent dysphagia x6 months.

EXAM:
ESOPHAGUS/BARIUM SWALLOW/TABLET STUDY
TECHNIQUE: Combined double and single contrast examination was performed using
effervescent crystals, thick barium liquid and thin barium liquid.
The patient was observed with fluoroscopy swallowing a 13 mm barium
sulphate tablet.
FLUOROSCOPY TIME:  Radiation Exposure Index (as provided by the
fluoroscopic device): 14.00 mGy

[Series 1: cp_standard · 0.17mm/px · 1 of 1 slices shown (1 of 9)]
[im 1/1]
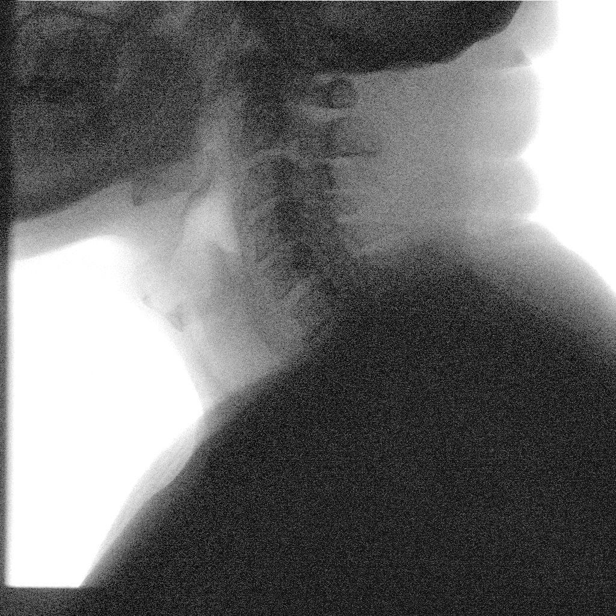

[Series 2: cp_standard · 0.17mm/px · 1 of 58 frames shown (2 of 9)]
[frame 30/58]
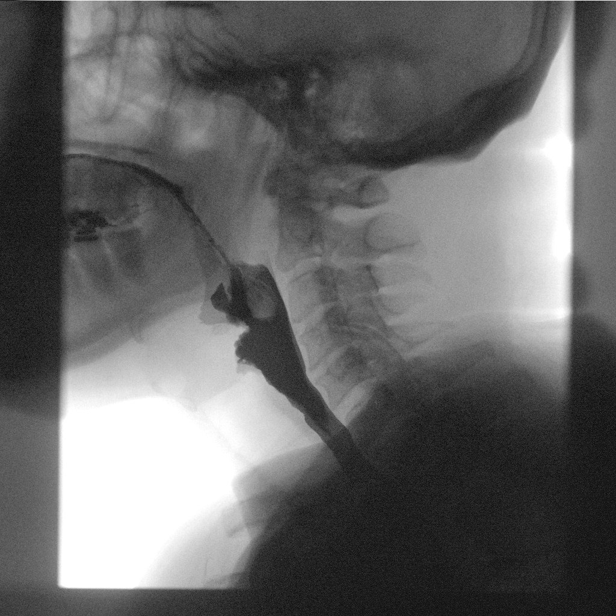

[Series 3: cp_standard · 0.17mm/px · 2 of 59 frames shown (3 of 9)]
[frame 26/59]
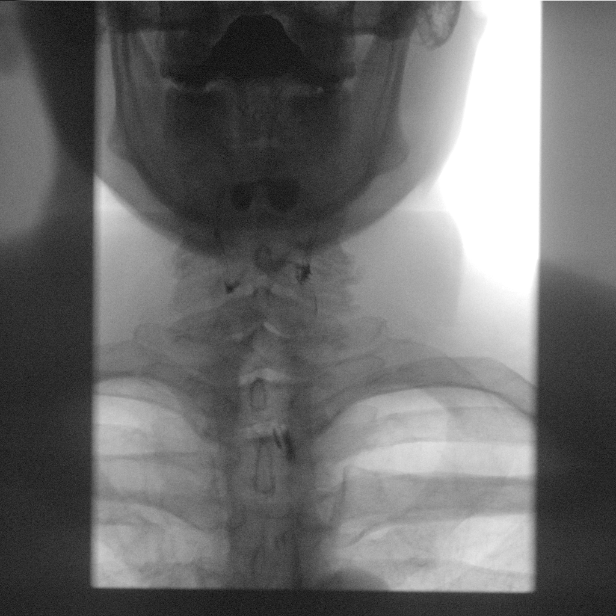
[frame 51/59]
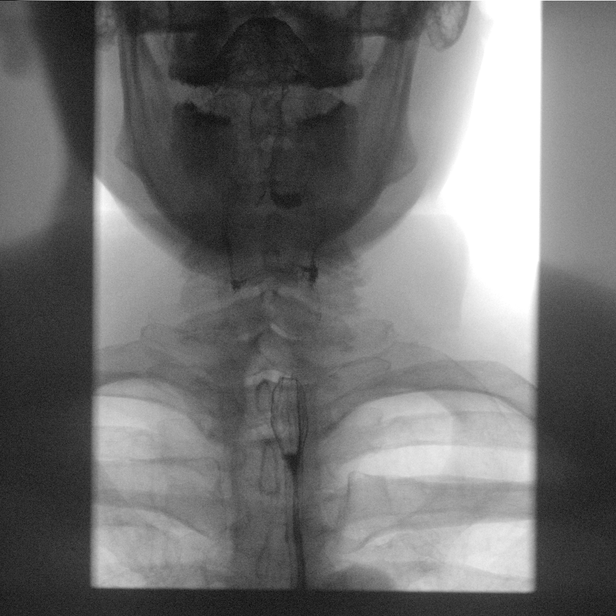

[Series 4: cp_standard · 0.28mm/px · 1 of 78 frames shown (4 of 9)]
[frame 40/78]
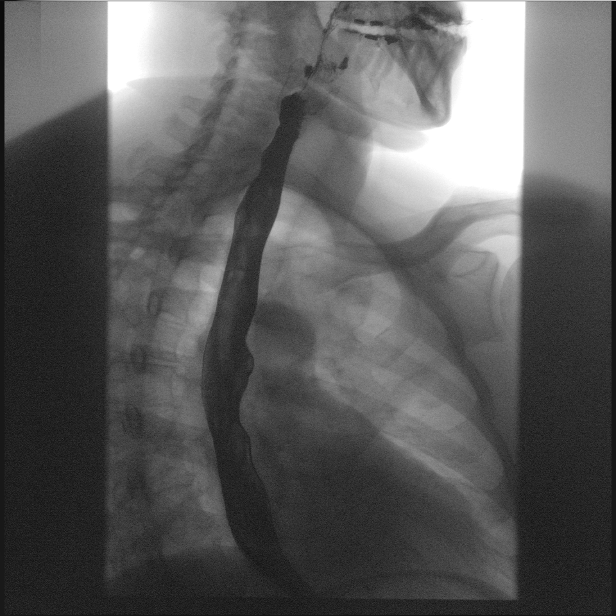

[Series 5: cp_standard · 0.28mm/px · 2 of 109 frames shown (5 of 9)]
[frame 17/109]
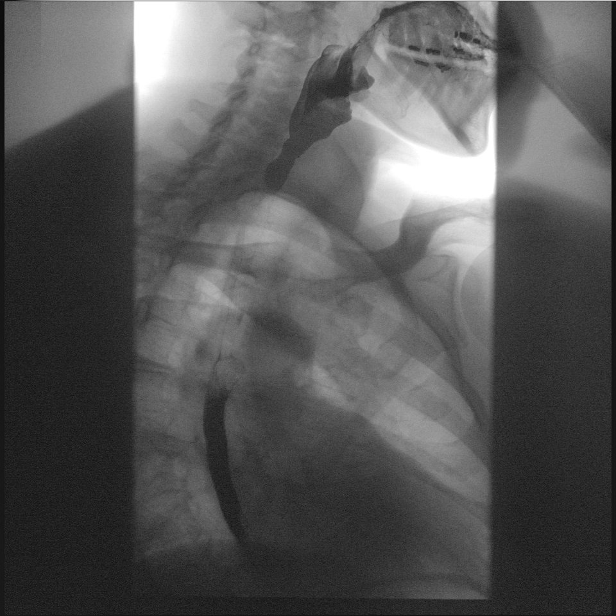
[frame 74/109]
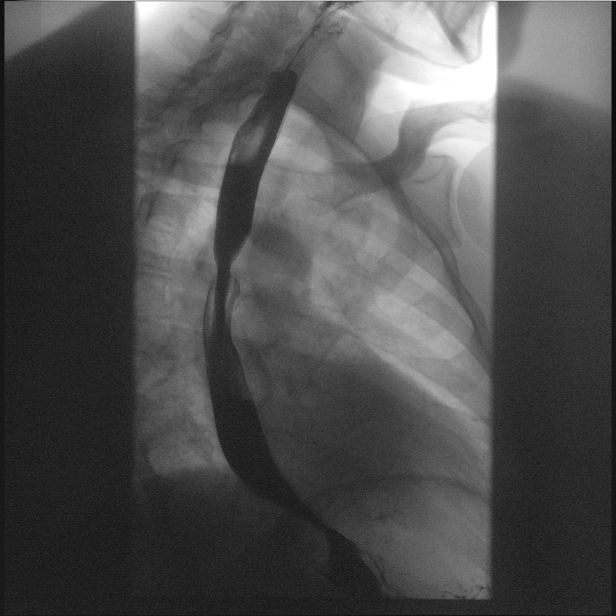

[Series 6: cp_standard · 0.28mm/px · 2 of 88 frames shown (6 of 9)]
[frame 3/88]
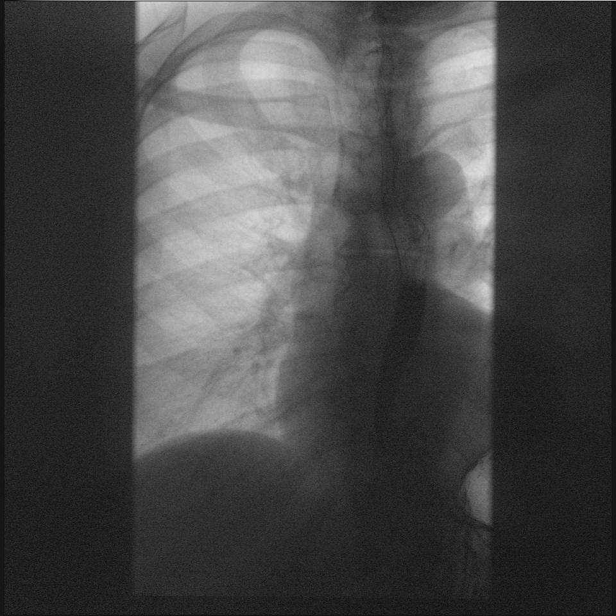
[frame 45/88]
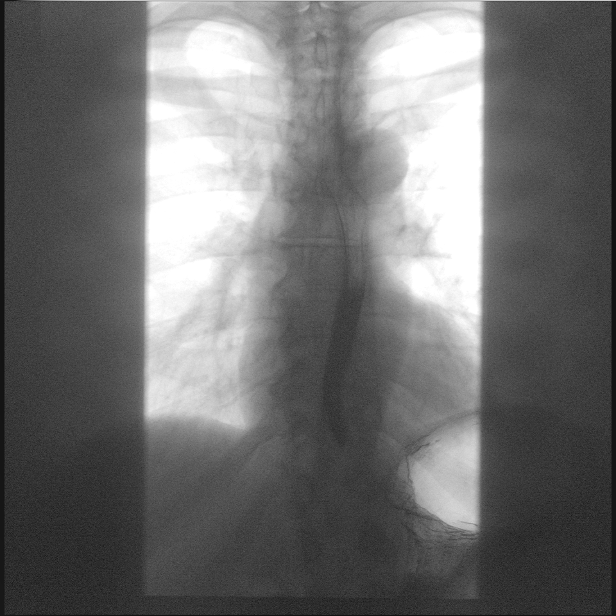

[Series 7: cp_standard · 0.29mm/px · 1 of 169 frames shown (7 of 9)]
[frame 26/169]
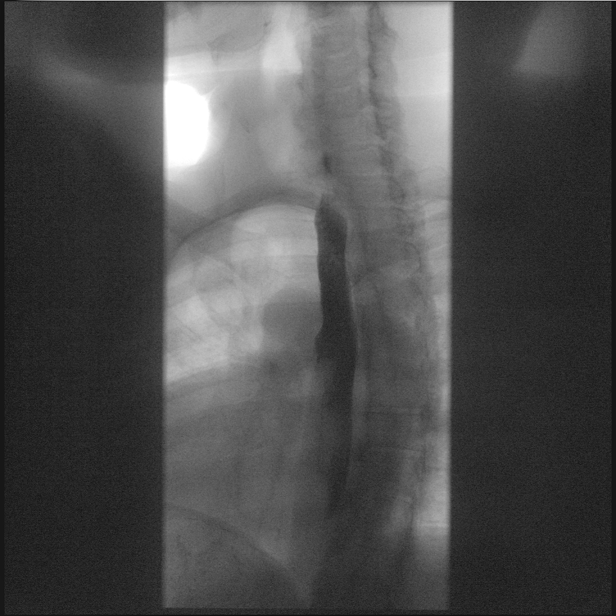

[Series 8: cp_standard · 0.29mm/px · 2 of 167 frames shown (8 of 9)]
[frame 26/167]
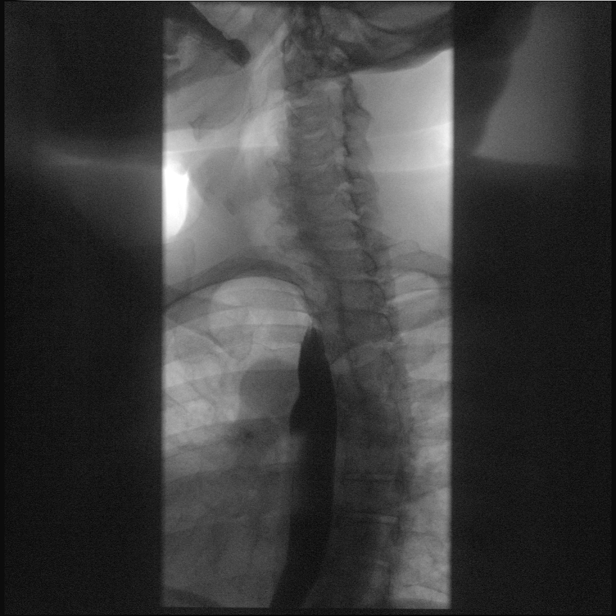
[frame 44/167]
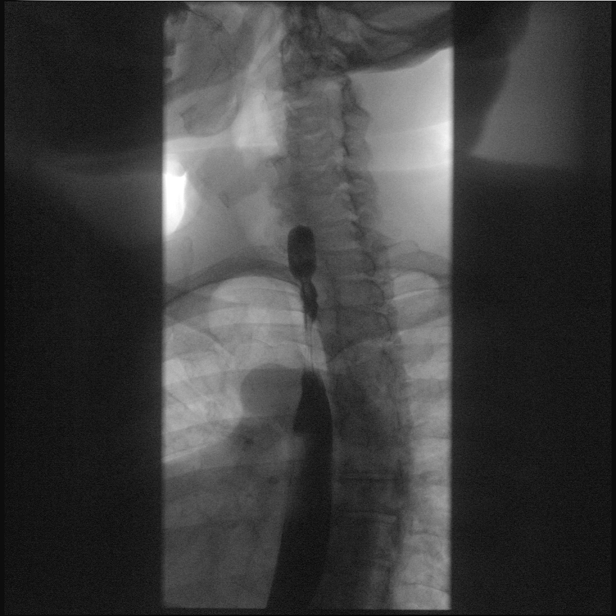

[Series 9: cp_standard · 0.29mm/px · 2 of 91 frames shown (9 of 9)]
[frame 14/91]
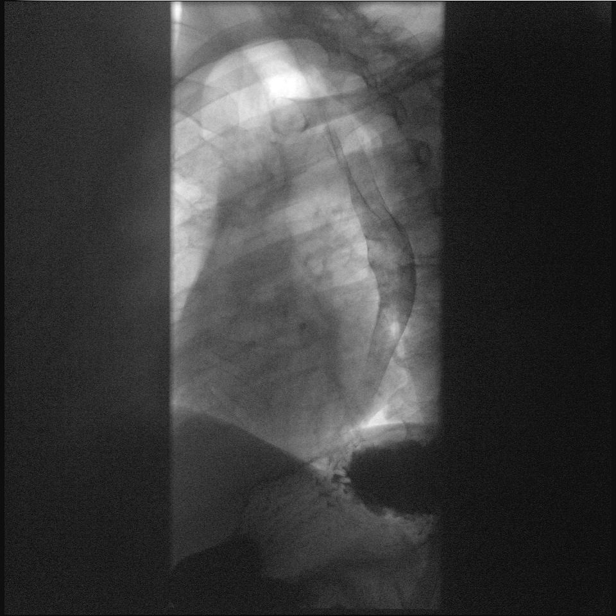
[frame 91/91]
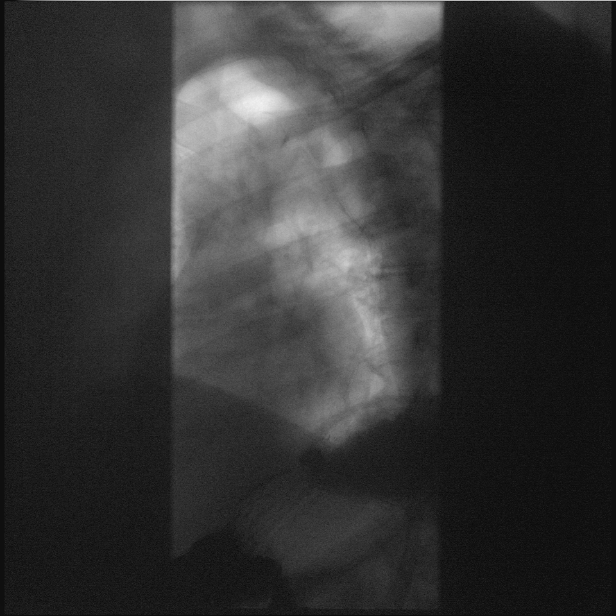

[14 of 24 positions shown; findings below may reference images not displayed]

PROCEDURE:
Normal pharyngeal anatomy and motility. No laryngeal penetration or
tracheal aspiration. Contrast flowed freely through the esophagus
without evidence of a stricture or mass. Normal esophageal mucosa
without evidence of irregularity or ulceration. Mild esophageal
dysmotility is seen with associated tertiary contractions. No
evidence of reflux. No definite hiatal hernia was demonstrated.

A 13 mm barium tablet was administered which transited through the
esophagus and esophagogastric junction without delay.

COMPLICATIONS:
NONE.
IMPRESSION: 1. Normal pharyngeal anatomy and motility.

2.  No evidence of esophageal stricture.

3. Mild esophageal dysmotility is seen with associated tertiary
contractions.

4.  No hiatal hernia seen.

5.  No evidence of gastroesophageal reflux seen.

This exam was performed by Orgert Kerschbaumer, and was supervised
and interpreted by Dr. Trixie Mae.

## 2022-08-16 ENCOUNTER — Encounter: Payer: Self-pay | Admitting: Family Medicine

## 2022-08-16 LAB — CBC WITH DIFFERENTIAL/PLATELET
Basophils Absolute: 0 10*3/uL (ref 0.0–0.2)
Basos: 1 %
EOS (ABSOLUTE): 0.1 10*3/uL (ref 0.0–0.4)
Eos: 1 %
Hematocrit: 33 % — ABNORMAL LOW (ref 37.5–51.0)
Hemoglobin: 10.8 g/dL — ABNORMAL LOW (ref 13.0–17.7)
Immature Grans (Abs): 0 10*3/uL (ref 0.0–0.1)
Immature Granulocytes: 0 %
Lymphocytes Absolute: 1.3 10*3/uL (ref 0.7–3.1)
Lymphs: 22 %
MCH: 24.9 pg — ABNORMAL LOW (ref 26.6–33.0)
MCHC: 32.7 g/dL (ref 31.5–35.7)
MCV: 76 fL — ABNORMAL LOW (ref 79–97)
Monocytes Absolute: 0.6 10*3/uL (ref 0.1–0.9)
Monocytes: 10 %
Neutrophils Absolute: 3.8 10*3/uL (ref 1.4–7.0)
Neutrophils: 66 %
Platelets: 294 10*3/uL (ref 150–450)
RBC: 4.33 x10E6/uL (ref 4.14–5.80)
RDW: 14.1 % (ref 11.6–15.4)
WBC: 5.8 10*3/uL (ref 3.4–10.8)

## 2022-08-16 LAB — IRON,TIBC AND FERRITIN PANEL
Ferritin: 24 ng/mL — ABNORMAL LOW (ref 30–400)
Iron Saturation: 29 % (ref 15–55)
Iron: 101 ug/dL (ref 38–169)
Total Iron Binding Capacity: 345 ug/dL (ref 250–450)
UIBC: 244 ug/dL (ref 111–343)

## 2022-08-21 ENCOUNTER — Ambulatory Visit (INDEPENDENT_AMBULATORY_CARE_PROVIDER_SITE_OTHER): Payer: Managed Care, Other (non HMO) | Admitting: Gastroenterology

## 2022-08-21 ENCOUNTER — Other Ambulatory Visit: Payer: Self-pay | Admitting: Family Medicine

## 2022-08-21 ENCOUNTER — Encounter: Payer: Self-pay | Admitting: Gastroenterology

## 2022-08-21 VITALS — BP 158/96 | HR 88 | Ht 67.0 in | Wt 215.0 lb

## 2022-08-21 DIAGNOSIS — K641 Second degree hemorrhoids: Secondary | ICD-10-CM | POA: Diagnosis not present

## 2022-08-21 DIAGNOSIS — D509 Iron deficiency anemia, unspecified: Secondary | ICD-10-CM | POA: Diagnosis not present

## 2022-08-21 MED ORDER — FERROUS SULFATE 325 (65 FE) MG PO TABS: 325.0000 mg | ORAL_TABLET | Freq: Two times a day (BID) | ORAL | Status: DC

## 2022-08-21 NOTE — Progress Notes (Signed)
56 year old male here for a follow-up visit for hemorrhoid banding.  Recall he has symptomatic hemorrhoids with frequent bleeding, I suspect causing his iron deficiency anemia.  I banded RP hemorrhoid on 06/19/22. Banded LL hemorrhoid on 07/11/22.  He reports after the 2 banding's his bleeding has significantly decreased.  He previously was having bleeding every day.  He has had only 3 small episodes since the last band, over a month now.  He is otherwise feeling well.  He wants to continue with his last banding scheduled for today.  He has had CBC yesterday which showed a slight downtrend in his hemoglobin but his iron studies actually looked improved on once daily oral iron.  He is otherwise taking fiber supplement daily and denies any straining or constipation.I discussed options with him and he wants to proceed with further banding today following discussion of risks and benefits.      PROCEDURE NOTE: The patient presents with symptomatic grade II  hemorrhoids, requesting rubber band ligation of his/her hemorrhoidal disease.  All risks, benefits and alternative forms of therapy were described and informed consent was obtained.   The anorectum was pre-medicated with 0.125% nitroglycerin The decision was made to band the RA  internal hemorrhoid, and the [facility] hemorrhoid bander was used to perform band ligation without complication.  Digital anorectal examination was then performed to assure proper positioning of the band, and to adjust the banded tissue as required.  The patient was discharged home without pain or other issues.  Dietary and behavioral recommendations were given and along with follow-up instructions.     The following adjunctive treatments were recommended: Daily fiber supplement, avoid straining   No further banding recommended at this time unless the patient has persistent bleeding symptoms. No complications were encountered and the patient tolerated the procedure well. Written  script for CBC and TIBC / ferritin for follow up of anemia while on iron and post banding - roughly in 2 months. Increase ferrous sulfate to BID dosing.    Jolly Mango, MD [facility]

## 2022-08-21 NOTE — Patient Instructions (Addendum)
If you are age 57 or older, your body mass index should be between 23-30. Your Body mass index is 33.67 kg/m. If this is out of the aforementioned range listed, please consider follow up with your Primary Care Provider.  If you are age 85 or younger, your body mass index should be between 19-25. Your Body mass index is 33.67 kg/m. If this is out of the aformentioned range listed, please consider follow up with your Primary Care Provider.   ________________________________________________________  [place]   The procedure you have had should have been relatively painless since the banding of the area involved does not have nerve endings and there is no pain sensation.  The rubber band cuts off the blood supply to the hemorrhoid and the band may fall off as soon as 48 hours after the banding (the band may occasionally be seen in the toilet bowl following a bowel movement). You may notice a temporary feeling of fullness in the rectum which should respond adequately to plain Tylenol or Motrin.  Following the banding, avoid strenuous exercise that evening and resume full activity the next day.  A sitz bath (soaking in a warm tub) or bidet is soothing, and can be useful for cleansing the area after bowel movements.     To avoid constipation, take two tablespoons of natural wheat bran, natural oat bran, flax, Benefiber or any over the counter fiber supplement and increase your water intake to 7-8 glasses daily.    Unless you have been prescribed anorectal medication, do not put anything inside your rectum for two weeks: No suppositories, enemas, fingers, etc.  Occasionally, you may have more bleeding than usual after the banding procedure.  This is often from the untreated hemorrhoids rather than the treated one.  Don't be concerned if there is a tablespoon or so of blood.  If there is more blood than this, lie flat with your bottom higher than your head and  apply an ice pack to the area. If the bleeding does not stop within a half an hour or if you feel faint, call our office at [phone] or go to the emergency room.  Problems are not common; however, if there is a substantial amount of bleeding, severe pain, chills, fever or difficulty passing urine (very rare) or other problems, you should call us at [phone] or report to the nearest emergency room.  Do not stay seated continuously for more than 2-3 hours for a day or two after the procedure.  Tighten your buttock muscles 10-15 times every two hours and take 10-15 deep breaths every 1-2 hours.  Do not spend more than a few minutes on the toilet if you cannot empty your bowel; instead re-visit the toilet at a later time.  Increase your oral iron to twice daily.  We are giving you labs for CBC and IBC/Ferretin.  Please have these drawn in December.  Thank you for entrusting me with your care and for choosing [facility], Dr. [place]

## 2022-08-22 MED ORDER — TRAMADOL HCL 50 MG PO TABS: ORAL_TABLET | ORAL | 0 refills | Status: DC

## 2022-08-22 NOTE — Telephone Encounter (Signed)
ERx 

## 2022-08-22 NOTE — Telephone Encounter (Signed)
Name of Medication: Tramadol Name of Pharmacy: [facility] or Written Date and Quantity: 06/24/22, #30 Last Office Visit and Type: 02/11/22, CPE Next Office Visit and Type: 12/11/22, 6 mo iron infusion f/u Last Controlled Substance Agreement Date: none Last UDS: none

## 2022-09-16 ENCOUNTER — Ambulatory Visit: Payer: Managed Care, Other (non HMO) | Admitting: Gastroenterology

## 2022-10-18 ENCOUNTER — Telehealth: Payer: Self-pay

## 2022-10-18 ENCOUNTER — Other Ambulatory Visit: Payer: Self-pay | Admitting: Gastroenterology

## 2022-10-18 NOTE — Telephone Encounter (Signed)
We received an FMLA form for patient.  This has been placed in your inbox for signing.  Patient has form filled out like his last form.  No due date listed.

## 2022-10-19 LAB — CBC WITH DIFFERENTIAL/PLATELET
Basophils Absolute: 0 10*3/uL (ref 0.0–0.2)
Basos: 1 %
EOS (ABSOLUTE): 0.1 10*3/uL (ref 0.0–0.4)
Eos: 1 %
Hematocrit: 38.4 % (ref 37.5–51.0)
Hemoglobin: 12.1 g/dL — ABNORMAL LOW (ref 13.0–17.7)
Immature Grans (Abs): 0 10*3/uL (ref 0.0–0.1)
Immature Granulocytes: 0 %
Lymphocytes Absolute: 1.4 10*3/uL (ref 0.7–3.1)
Lymphs: 22 %
MCH: 24.8 pg — ABNORMAL LOW (ref 26.6–33.0)
MCHC: 31.5 g/dL (ref 31.5–35.7)
MCV: 79 fL (ref 79–97)
Monocytes Absolute: 0.8 10*3/uL (ref 0.1–0.9)
Monocytes: 12 %
Neutrophils Absolute: 4.1 10*3/uL (ref 1.4–7.0)
Neutrophils: 64 %
Platelets: 321 10*3/uL (ref 150–450)
RBC: 4.88 x10E6/uL (ref 4.14–5.80)
RDW: 16.5 % — ABNORMAL HIGH (ref 11.6–15.4)
WBC: 6.3 10*3/uL (ref 3.4–10.8)

## 2022-10-19 LAB — IRON,TIBC AND FERRITIN PANEL
Ferritin: 46 ng/mL (ref 30–400)
Iron Saturation: 16 % (ref 15–55)
Iron: 52 ug/dL (ref 38–169)
Total Iron Binding Capacity: 321 ug/dL (ref 250–450)
UIBC: 269 ug/dL (ref 111–343)

## 2022-10-20 ENCOUNTER — Encounter: Payer: Self-pay | Admitting: Family Medicine

## 2022-10-20 DIAGNOSIS — D500 Iron deficiency anemia secondary to blood loss (chronic): Secondary | ICD-10-CM

## 2022-10-20 DIAGNOSIS — Z1159 Encounter for screening for other viral diseases: Secondary | ICD-10-CM

## 2022-10-20 DIAGNOSIS — N401 Enlarged prostate with lower urinary tract symptoms: Secondary | ICD-10-CM

## 2022-10-20 DIAGNOSIS — E785 Hyperlipidemia, unspecified: Secondary | ICD-10-CM

## 2022-10-24 ENCOUNTER — Other Ambulatory Visit: Payer: Self-pay | Admitting: Family Medicine

## 2022-10-24 NOTE — Telephone Encounter (Signed)
Filled out and will give to [facility] tomorrow when I'm back in the office.

## 2022-10-24 NOTE — Addendum Note (Signed)
Addended by: Donnamarie Poag on: 10/24/2022 11:35 AM   Modules accepted: Orders

## 2022-10-25 NOTE — Telephone Encounter (Signed)
Name of Medication: Tramadol Name of Pharmacy: [facility] [place] Last Fill or Written Date and Quantity: 08/22/22, #30 Last Office Visit and Type: 02/11/22, CPE Next Office Visit and Type: 12/11/22, CPE Last Controlled Substance Agreement Date: none Last UDS: none  Robaxin last rx: 03/11/22, #30

## 2022-10-25 NOTE — Telephone Encounter (Signed)
Patient's copy has been placed in outgoing mail per request.

## 2022-10-25 NOTE — Telephone Encounter (Signed)
FMLA form faxed to fax#[phone].  Received fax confirmation.  Copies made for patient, scanning, and myself.  Sent msg via mychart to see if patient wants to pick up form or have it mailed out.  I have his copy at my desk until he responds.

## 2022-10-29 MED ORDER — METHOCARBAMOL 500 MG PO TABS: 500.0000 mg | ORAL_TABLET | Freq: Two times a day (BID) | ORAL | 3 refills | Status: DC | PRN

## 2022-10-29 MED ORDER — TRAMADOL HCL 50 MG PO TABS: ORAL_TABLET | ORAL | 0 refills | Status: DC

## 2022-10-29 NOTE — Telephone Encounter (Signed)
ERx 

## 2022-11-13 ENCOUNTER — Telehealth: Payer: Self-pay

## 2022-11-13 DIAGNOSIS — D509 Iron deficiency anemia, unspecified: Secondary | ICD-10-CM

## 2022-11-13 NOTE — Telephone Encounter (Signed)
-----   Message from Missy Sabins, RN sent at 10/21/2022  1:47 PM EST ----- Regarding: March appt Needs OV in March 2024 with Dr. Adela Lank RE: hemorrhoids/IDA

## 2022-11-13 NOTE — Telephone Encounter (Signed)
Patient has a follow up appt with Dr. Adela Lank on Tuesday, 01-28-23 at 9:20 am. Patient should have labs drawn on 01-27-23.  Appt information/lab reminder sent to patient via MyChart and mailed.   Lab orders in epic.

## 2022-11-23 ENCOUNTER — Encounter: Payer: Self-pay | Admitting: Gastroenterology

## 2022-11-23 LAB — LIPID PANEL
Chol/HDL Ratio: 4 ratio (ref 0.0–5.0)
Cholesterol, Total: 235 mg/dL — ABNORMAL HIGH (ref 100–199)
HDL: 59 mg/dL (ref >39)
LDL Chol Calc (NIH): 160 mg/dL — ABNORMAL HIGH (ref 0–99)
Triglycerides: 93 mg/dL (ref 0–149)
VLDL Cholesterol Cal: 16 mg/dL (ref 5–40)

## 2022-11-23 LAB — COMPREHENSIVE METABOLIC PANEL
ALT: 15 IU/L (ref 0–44)
AST: 13 IU/L (ref 0–40)
Albumin/Globulin Ratio: 1.9 (ref 1.2–2.2)
Albumin: 4.4 g/dL (ref 3.8–4.9)
Alkaline Phosphatase: 42 IU/L — ABNORMAL LOW (ref 44–121)
BUN/Creatinine Ratio: 12 (ref 9–20)
BUN: 13 mg/dL (ref 6–24)
Bilirubin Total: 0.7 mg/dL (ref 0.0–1.2)
CO2: 25 mmol/L (ref 20–29)
Calcium: 9.3 mg/dL (ref 8.7–10.2)
Chloride: 99 mmol/L (ref 96–106)
Creatinine, Ser: 1.06 mg/dL (ref 0.76–1.27)
Globulin, Total: 2.3 g/dL (ref 1.5–4.5)
Glucose: 105 mg/dL — ABNORMAL HIGH (ref 70–99)
Potassium: 4.3 mmol/L (ref 3.5–5.2)
Sodium: 136 mmol/L (ref 134–144)
Total Protein: 6.7 g/dL (ref 6.0–8.5)
eGFR: 82 mL/min/{1.73_m2} (ref >59)

## 2022-11-23 LAB — CBC WITH DIFFERENTIAL/PLATELET
Basophils Absolute: 0 10*3/uL (ref 0.0–0.2)
Basos: 1 %
EOS (ABSOLUTE): 0.1 10*3/uL (ref 0.0–0.4)
Eos: 2 %
Hematocrit: 39.7 % (ref 37.5–51.0)
Hemoglobin: 12.8 g/dL — ABNORMAL LOW (ref 13.0–17.7)
Immature Grans (Abs): 0 10*3/uL (ref 0.0–0.1)
Immature Granulocytes: 0 %
Lymphocytes Absolute: 0.9 10*3/uL (ref 0.7–3.1)
Lymphs: 18 %
MCH: 25.4 pg — ABNORMAL LOW (ref 26.6–33.0)
MCHC: 32.2 g/dL (ref 31.5–35.7)
MCV: 79 fL (ref 79–97)
Monocytes Absolute: 0.5 10*3/uL (ref 0.1–0.9)
Monocytes: 10 %
Neutrophils Absolute: 3.6 10*3/uL (ref 1.4–7.0)
Neutrophils: 69 %
Platelets: 295 10*3/uL (ref 150–450)
RBC: 5.04 x10E6/uL (ref 4.14–5.80)
RDW: 15.8 % — ABNORMAL HIGH (ref 11.6–15.4)
WBC: 5.1 10*3/uL (ref 3.4–10.8)

## 2022-11-23 LAB — IRON,TIBC AND FERRITIN PANEL
Ferritin: 58 ng/mL (ref 30–400)
Iron Saturation: 17 % (ref 15–55)
Iron: 55 ug/dL (ref 38–169)
Total Iron Binding Capacity: 324 ug/dL (ref 250–450)
UIBC: 269 ug/dL (ref 111–343)

## 2022-11-23 LAB — HEPATITIS C ANTIBODY: Hep C Virus Ab: NONREACTIVE

## 2022-11-23 LAB — PSA: Prostate Specific Ag, Serum: 1.9 ng/mL (ref 0.0–4.0)

## 2022-12-02 ENCOUNTER — Other Ambulatory Visit: Payer: Managed Care, Other (non HMO)

## 2022-12-06 ENCOUNTER — Encounter: Payer: Managed Care, Other (non HMO) | Admitting: Family Medicine

## 2022-12-11 ENCOUNTER — Encounter: Payer: Self-pay | Admitting: Family Medicine

## 2022-12-11 ENCOUNTER — Ambulatory Visit (INDEPENDENT_AMBULATORY_CARE_PROVIDER_SITE_OTHER): Payer: Managed Care, Other (non HMO) | Admitting: Family Medicine

## 2022-12-11 VITALS — BP 136/84 | HR 102 | Temp 97.7°F | Ht 66.75 in | Wt 218.4 lb

## 2022-12-11 DIAGNOSIS — G8929 Other chronic pain: Secondary | ICD-10-CM | POA: Insufficient documentation

## 2022-12-11 DIAGNOSIS — E785 Hyperlipidemia, unspecified: Secondary | ICD-10-CM | POA: Diagnosis not present

## 2022-12-11 DIAGNOSIS — I10 Essential (primary) hypertension: Secondary | ICD-10-CM

## 2022-12-11 DIAGNOSIS — E669 Obesity, unspecified: Secondary | ICD-10-CM

## 2022-12-11 DIAGNOSIS — R351 Nocturia: Secondary | ICD-10-CM

## 2022-12-11 DIAGNOSIS — Z0000 Encounter for general adult medical examination without abnormal findings: Secondary | ICD-10-CM

## 2022-12-11 DIAGNOSIS — K648 Other hemorrhoids: Secondary | ICD-10-CM

## 2022-12-11 DIAGNOSIS — M545 Low back pain, unspecified: Secondary | ICD-10-CM

## 2022-12-11 DIAGNOSIS — K219 Gastro-esophageal reflux disease without esophagitis: Secondary | ICD-10-CM

## 2022-12-11 DIAGNOSIS — N401 Enlarged prostate with lower urinary tract symptoms: Secondary | ICD-10-CM

## 2022-12-11 DIAGNOSIS — D509 Iron deficiency anemia, unspecified: Secondary | ICD-10-CM

## 2022-12-11 DIAGNOSIS — M542 Cervicalgia: Secondary | ICD-10-CM

## 2022-12-11 MED ORDER — METAMUCIL SMOOTH TEXTURE 58.6 % PO POWD: 1.0000 | Freq: Every day | ORAL | Status: AC

## 2022-12-11 MED ORDER — AMLODIPINE BESYLATE 10 MG PO TABS: 10.0000 mg | ORAL_TABLET | Freq: Every day | ORAL | 4 refills | Status: DC

## 2022-12-11 MED ORDER — TRAMADOL HCL 50 MG PO TABS: ORAL_TABLET | ORAL | 0 refills | Status: DC

## 2022-12-11 MED ORDER — HYDROCHLOROTHIAZIDE 25 MG PO TABS: 25.0000 mg | ORAL_TABLET | Freq: Every day | ORAL | 4 refills | Status: DC

## 2022-12-11 NOTE — Assessment & Plan Note (Signed)
S/p several procedures last year with improvement.

## 2022-12-11 NOTE — Assessment & Plan Note (Signed)
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 Taking about 4-5 tramadol per week. Continue tramadol BID PRN, #50 per refill.

## 2022-12-11 NOTE — Assessment & Plan Note (Addendum)
Chronic, off medication. Declines medication. Low chol diet handout provided today.  The 10-year ASCVD risk score (Arnett DK, et al., 2019) is: 13.2%   Values used to calculate the score:     Age: 57 years     Sex: Male     Is Non-Hispanic African American: Yes     Diabetic: No     Tobacco smoker: No     Systolic Blood Pressure: 802 mmHg     Is BP treated: Yes     HDL Cholesterol: 59 mg/dL     Total Cholesterol: 235 mg/dL

## 2022-12-11 NOTE — Progress Notes (Signed)
Patient ID: [name] [name], male    DOB: [birth year], 57 y.o.   MRN: [id number]  This visit was conducted in person.  BP 136/84   Pulse (!) 102   Temp 97.7 F (36.5 C) (Temporal)   Ht 5' 6.75" (1.695 m)   Wt 218 lb 6 oz (99.1 kg)   SpO2 98%   BMI 34.46 kg/m    CC: CPE Subjective:   HPI: [name] [name] is a 57 y.o. male presenting on 12/11/2022 for Annual Exam   Continues caring for wife, has had several surgeries as well as stroke last year.   Ongoing knees, back, neck pain. He takes tramadol for this BID PRN, takes about 4 times a week. Requests #50 per prescription to last him several months. Also takes robaxin. No shooting pain down arms or paresthesias or numbness or weakness.   Sees Dr Havery Moros s/p hemorrhoid surgery with improvement in bleeding and anemia.   Continues oral iron replacement BID tolerating well as well as omeprazole 40mg  daily.   Preventative: COLONOSCOPY Date: 12/2012 int hem, diverticulosis ([facility])  COLONOSCOPY 05/2017 inflamed int hem possible source of IDA - offered EGD, diverticulosis, rpt 10 yrs ([facility])  UPPER GI ENDOSCOPY 07/10/2022 - 3cm HH, gastritis, neg H Pylori, empiric dilation ([facility]) Prostate - desires yearly check. Nocturia x2-3, some weakening of stream.  Lung cancer screening - not eligible Flu shot - declines  COVID vaccine - declines  Tetanus shot - 2011. Declines today  Shingrix - discussed  Seat belt use discussed  Sunscreen use discussed. No changing moles on skin.  Sleep - averaging 8 hours/night Non smoker  Alcohol - none  Dentist q6 mo  Eye exam yearly   Billing specialist - [facility] Lives with wife and son Activity: tries to walk daily for about 1 mi  Diet: good water, fruits/vegetables daily, fish 1x/wk       Relevant past medical, surgical, family and social history reviewed and updated as indicated. Interim medical history since our last visit reviewed. Allergies and medications reviewed and  updated. Outpatient Medications Prior to Visit  Medication Sig Dispense Refill   acetaminophen (TYLENOL) 500 MG tablet Take 2 tablets (1,000 mg total) by mouth 3 (three) times daily as needed for moderate pain.     diclofenac Sodium (VOLTAREN) 1 % GEL Apply 2 g topically 3 (three) times daily. (Patient taking differently: Apply 2 g topically daily as needed (pain).) 100 g 3   docusate sodium (COLACE) 100 MG capsule Take 1 capsule (100 mg total) by mouth daily. (Patient taking differently: Take 100 mg by mouth daily as needed for mild constipation or moderate constipation.)     ferrous sulfate 325 (65 FE) MG tablet Take 1 tablet (325 mg total) by mouth 2 (two) times daily with a meal.     hydrOXYzine (ATARAX) 25 MG tablet Take 0.5-1 tablets (12.5-25 mg total) by mouth 2 (two) times daily as needed for anxiety (sedation precautions). 30 tablet 1   methocarbamol (ROBAXIN) 500 MG tablet Take 1 tablet (500 mg total) by mouth 2 (two) times daily as needed for muscle spasms (sedation precautions). 30 tablet 3   omeprazole (PRILOSEC) 40 MG capsule Take 1 capsule (40 mg total) by mouth daily. 90 capsule 1   amLODipine (NORVASC) 10 MG tablet Take 1 tablet (10 mg total) by mouth daily. 90 tablet 3   hydrochlorothiazide (HYDRODIURIL) 25 MG tablet Take 1 tablet (25 mg total) by mouth daily. 90 tablet 3   HYDROcodone-acetaminophen (NORCO/VICODIN) 5-325  MG tablet Take 1 tablet by mouth every 6 (six) hours as needed for moderate pain. 20 tablet 0   traMADol (ULTRAM) 50 MG tablet TAKE 1 TABLET BY MOUTH EVERY 12 HOURS AS NEEDED FOR MODERATE PAIN 30 tablet 0   famotidine (PEPCID) 20 MG tablet Take 20 mg by mouth daily as needed for indigestion. (Patient not taking: Reported on 08/21/2022)     hydrocortisone (ANUSOL-HC) 25 MG suppository Place 1 suppository (25 mg total) rectally 2 (two) times daily. 12 suppository 0   No facility-administered medications prior to visit.     Per HPI unless specifically indicated in  ROS section below Review of Systems  Constitutional:  Negative for activity change, appetite change, chills, fatigue, fever and unexpected weight change.  HENT:  Negative for hearing loss.   Eyes:  Negative for visual disturbance.  Respiratory:  Negative for cough, chest tightness, shortness of breath and wheezing.   Cardiovascular:  Negative for chest pain, palpitations and leg swelling.  Gastrointestinal:  Negative for abdominal distention, abdominal pain, blood in stool, constipation, diarrhea, nausea and vomiting.  Genitourinary:  Negative for difficulty urinating and hematuria.  Musculoskeletal:  Negative for arthralgias, myalgias and neck pain.  Skin:  Negative for rash.  Neurological:  Negative for dizziness, seizures, syncope and headaches.  Hematological:  Negative for adenopathy. Does not bruise/bleed easily.  Psychiatric/Behavioral:  Negative for dysphoric mood. The patient is not nervous/anxious.     Objective:  BP 136/84   Pulse (!) 102   Temp 97.7 F (36.5 C) (Temporal)   Ht 5' 6.75" (1.695 m)   Wt 218 lb 6 oz (99.1 kg)   SpO2 98%   BMI 34.46 kg/m   Wt Readings from Last 3 Encounters:  12/11/22 218 lb 6 oz (99.1 kg)  08/21/22 215 lb (97.5 kg)  07/11/22 210 lb 6 oz (95.4 kg)      Physical Exam Vitals and nursing note reviewed.  Constitutional:      General: He is not in acute distress.    Appearance: Normal appearance. He is well-developed. He is not ill-appearing.  HENT:     Head: Normocephalic and atraumatic.     Right Ear: Hearing, tympanic membrane, ear canal and external ear normal.     Left Ear: Hearing, tympanic membrane, ear canal and external ear normal.  Eyes:     General: No scleral icterus.    Extraocular Movements: Extraocular movements intact.     Conjunctiva/sclera: Conjunctivae normal.     Pupils: Pupils are equal, round, and reactive to light.  Neck:     Thyroid: No thyroid mass or thyromegaly.  Cardiovascular:     Rate and Rhythm: Normal  rate and regular rhythm.     Pulses: Normal pulses.          Radial pulses are 2+ on the right side and 2+ on the left side.     Heart sounds: Normal heart sounds. No murmur heard. Pulmonary:     Effort: Pulmonary effort is normal. No respiratory distress.     Breath sounds: Normal breath sounds. No wheezing, rhonchi or rales.  Abdominal:     General: Bowel sounds are normal. There is no distension.     Palpations: Abdomen is soft. There is no mass.     Tenderness: There is no abdominal tenderness. There is no guarding or rebound.     Hernia: No hernia is present.  Musculoskeletal:        General: Normal range of motion.  Cervical back: Normal range of motion and neck supple.     Right lower leg: No edema.     Left lower leg: No edema.  Lymphadenopathy:     Cervical: No cervical adenopathy.  Skin:    General: Skin is warm and dry.     Findings: No rash.  Neurological:     General: No focal deficit present.     Mental Status: He is alert and oriented to person, place, and time.  Psychiatric:        Mood and Affect: Mood normal.        Behavior: Behavior normal.        Thought Content: Thought content normal.        Judgment: Judgment normal.       Results for orders placed or performed in visit on 10/20/22  Comprehensive metabolic panel  Result Value Ref Range   Glucose 105 (H) 70 - 99 mg/dL   BUN 13 6 - 24 mg/dL   Creatinine, Ser 1.24 0.76 - 1.27 mg/dL   eGFR 82 >58 KD/XIP/3.82   BUN/Creatinine Ratio 12 9 - 20   Sodium 136 134 - 144 mmol/L   Potassium 4.3 3.5 - 5.2 mmol/L   Chloride 99 96 - 106 mmol/L   CO2 25 20 - 29 mmol/L   Calcium 9.3 8.7 - 10.2 mg/dL   Total Protein 6.7 6.0 - 8.5 g/dL   Albumin 4.4 3.8 - 4.9 g/dL   Globulin, Total 2.3 1.5 - 4.5 g/dL   Albumin/Globulin Ratio 1.9 1.2 - 2.2   Bilirubin Total 0.7 0.0 - 1.2 mg/dL   Alkaline Phosphatase 42 (L) 44 - 121 IU/L   AST 13 0 - 40 IU/L   ALT 15 0 - 44 IU/L  Hepatitis C Antibody  Result Value Ref Range    Hep C Virus Ab Non Reactive Non Reactive  Iron, TIBC and Ferritin Panel  Result Value Ref Range   Total Iron Binding Capacity 324 250 - 450 ug/dL   UIBC 505 397 - 673 ug/dL   Iron 55 38 - 419 ug/dL   Iron Saturation 17 15 - 55 %   Ferritin 58 30 - 400 ng/mL  Lipid Panel  Result Value Ref Range   Cholesterol, Total 235 (H) 100 - 199 mg/dL   Triglycerides 93 0 - 149 mg/dL   HDL 59 >37 mg/dL   VLDL Cholesterol Cal 16 5 - 40 mg/dL   LDL Chol Calc (NIH) 902 (H) 0 - 99 mg/dL   Chol/HDL Ratio 4.0 0.0 - 5.0 ratio  PSA  Result Value Ref Range   Prostate Specific Ag, Serum 1.9 0.0 - 4.0 ng/mL  CBC with Differential/Platelet  Result Value Ref Range   WBC 5.1 3.4 - 10.8 x10E3/uL   RBC 5.04 4.14 - 5.80 x10E6/uL   Hemoglobin 12.8 (L) 13.0 - 17.7 g/dL   Hematocrit 40.9 73.5 - 51.0 %   MCV 79 79 - 97 fL   MCH 25.4 (L) 26.6 - 33.0 pg   MCHC 32.2 31.5 - 35.7 g/dL   RDW 32.9 (H) 92.4 - 26.8 %   Platelets 295 150 - 450 x10E3/uL   Neutrophils 69 Not Estab. %   Lymphs 18 Not Estab. %   Monocytes 10 Not Estab. %   Eos 2 Not Estab. %   Basos 1 Not Estab. %   Neutrophils Absolute 3.6 1.4 - 7.0 x10E3/uL   Lymphocytes Absolute 0.9 0.7 - 3.1 x10E3/uL   Monocytes Absolute 0.5  0.1 - 0.9 x10E3/uL   EOS (ABSOLUTE) 0.1 0.0 - 0.4 x10E3/uL   Basophils Absolute 0.0 0.0 - 0.2 x10E3/uL   Immature Granulocytes 0 Not Estab. %   Immature Grans (Abs) 0.0 0.0 - 0.1 x10E3/uL    Assessment & Plan:   Problem List Items Addressed This Visit     Health maintenance examination - Primary (Chronic)    Preventative protocols reviewed and updated unless pt declined. Discussed healthy diet and lifestyle.       Obesity, Class I, BMI 30-34.9    Encourage healthy diet and lifestyle choices to affect sustainable weight loss.       Iron deficiency anemia    Continued improvement in anemia with normal iron panel on oral iron BID - followed by GI.       HTN (hypertension)    Chronic, stable on current regimen -  continue      Relevant Medications   amLODipine (NORVASC) 10 MG tablet   hydrochlorothiazide (HYDRODIURIL) 25 MG tablet   HLD (hyperlipidemia)    Chronic, off medication. Declines medication. Low chol diet handout provided today.  The 10-year ASCVD risk score (Arnett DK, et al., 2019) is: 13.2%   Values used to calculate the score:     Age: 89 years     Sex: Male     Is Non-Hispanic African American: Yes     Diabetic: No     Tobacco smoker: No     Systolic Blood Pressure: 960 mmHg     Is BP treated: Yes     HDL Cholesterol: 59 mg/dL     Total Cholesterol: 235 mg/dL       Relevant Medications   amLODipine (NORVASC) 10 MG tablet   hydrochlorothiazide (HYDRODIURIL) 25 MG tablet   Lower back pain    Chronic, xrays with mild DDD L4/5, manages with tramadol and robaxin and tylenol. Taking about 4-5 tramadol per week. Continue tramadol BID PRN, #50 per refill.       Relevant Medications   traMADol (ULTRAM) 50 MG tablet   BPH (benign prostatic hyperplasia)    PSA stable. Mild nocturia, not bothersome. No fmhx prostate cancer.       Internal hemorrhoids    S/p several procedures last year with improvement.       Relevant Medications   amLODipine (NORVASC) 10 MG tablet   hydrochlorothiazide (HYDRODIURIL) 25 MG tablet   GERD (gastroesophageal reflux disease)    Stable period on omeprazole 40mg  daily.       Relevant Medications   psyllium (METAMUCIL SMOOTH TEXTURE) 58.6 % powder   Neck pain    Pain to posterior neck, with FROM. Discussed robaxin, provided with stretching exercises from [facility] pt advisor.         Meds ordered this encounter  Medications   traMADol (ULTRAM) 50 MG tablet    Sig: TAKE 1 TABLET BY MOUTH EVERY 12 HOURS AS NEEDED FOR MODERATE PAIN    Dispense:  50 tablet    Refill:  0   amLODipine (NORVASC) 10 MG tablet    Sig: Take 1 tablet (10 mg total) by mouth daily.    Dispense:  90 tablet    Refill:  4   hydrochlorothiazide (HYDRODIURIL) 25 MG tablet     Sig: Take 1 tablet (25 mg total) by mouth daily.    Dispense:  90 tablet    Refill:  4   psyllium (METAMUCIL SMOOTH TEXTURE) 58.6 % powder    Sig: Take 1 packet by  mouth daily.    No orders of the defined types were placed in this encounter.   Patient Instructions  Consider flu shot, shingles shot, tetanus shot.  Try stretching exercises for neck.  Work on low cholesterol diet to improve cholesterol levels. We will recheck this next year.  Good to see you today Return as needed or in 1 year for next physical.   Follow up plan: Return in about 1 year (around 12/12/2023) for annual exam, prior fasting for blood work.  Ria Bush, MD

## 2022-12-11 NOTE — Assessment & Plan Note (Signed)
Chronic, stable on current regimen - continue. 

## 2022-12-11 NOTE — Assessment & Plan Note (Signed)
Pain to posterior neck, with FROM. Discussed robaxin, provided with stretching exercises from [facility] pt advisor.

## 2022-12-11 NOTE — Assessment & Plan Note (Signed)
Encourage healthy diet and lifestyle choices to affect sustainable weight loss.  

## 2022-12-11 NOTE — Patient Instructions (Addendum)
Consider flu shot, shingles shot, tetanus shot.  Try stretching exercises for neck.  Work on low cholesterol diet to improve cholesterol levels. We will recheck this next year.  Good to see you today Return as needed or in 1 year for next physical.

## 2022-12-11 NOTE — Assessment & Plan Note (Addendum)
Stable period on omeprazole 40mg daily.  

## 2022-12-11 NOTE — Assessment & Plan Note (Signed)
Continued improvement in anemia with normal iron panel on oral iron BID - followed by GI.

## 2022-12-11 NOTE — Assessment & Plan Note (Signed)
Preventative protocols reviewed and updated unless pt declined. Discussed healthy diet and lifestyle.  

## 2022-12-11 NOTE — Assessment & Plan Note (Signed)
PSA stable. Mild nocturia, not bothersome. No fmhx prostate cancer.

## 2023-01-10 ENCOUNTER — Other Ambulatory Visit: Payer: Self-pay

## 2023-01-10 DIAGNOSIS — D509 Iron deficiency anemia, unspecified: Secondary | ICD-10-CM

## 2023-01-10 DIAGNOSIS — R1310 Dysphagia, unspecified: Secondary | ICD-10-CM

## 2023-01-10 MED ORDER — OMEPRAZOLE 40 MG PO CPDR: 40.0000 mg | DELAYED_RELEASE_CAPSULE | Freq: Every day | ORAL | 1 refills | Status: DC

## 2023-01-10 NOTE — Telephone Encounter (Signed)
Omeprazole refilled as pharmacy requested. [facility] is up to date on office visits.

## 2023-01-20 ENCOUNTER — Telehealth: Payer: Self-pay

## 2023-01-20 ENCOUNTER — Other Ambulatory Visit: Payer: Self-pay | Admitting: Gastroenterology

## 2023-01-20 DIAGNOSIS — D509 Iron deficiency anemia, unspecified: Secondary | ICD-10-CM

## 2023-01-20 NOTE — Telephone Encounter (Signed)
-----   Message from Yevette Edwards, RN sent at 10/21/2022  1:48 PM EST ----- Regarding: Labs due CBC and IBC + Ferritin - need to enter orders

## 2023-01-20 NOTE — Telephone Encounter (Signed)
Lab orders in epic. MyChart message sent to patient with lab reminder. 

## 2023-01-20 NOTE — Telephone Encounter (Signed)
Patient reviewed and responded to MyChart message. Last read by Lorn Junes at 10:54 AM on 01/20/2023.

## 2023-01-21 LAB — CBC WITH DIFFERENTIAL/PLATELET
Basophils Absolute: 0 10*3/uL (ref 0.0–0.2)
Basos: 1 %
EOS (ABSOLUTE): 0.1 10*3/uL (ref 0.0–0.4)
Eos: 1 %
Hematocrit: 39.5 % (ref 37.5–51.0)
Hemoglobin: 13 g/dL (ref 13.0–17.7)
Immature Grans (Abs): 0 10*3/uL (ref 0.0–0.1)
Immature Granulocytes: 0 %
Lymphocytes Absolute: 1.2 10*3/uL (ref 0.7–3.1)
Lymphs: 24 %
MCH: 27.2 pg (ref 26.6–33.0)
MCHC: 32.9 g/dL (ref 31.5–35.7)
MCV: 83 fL (ref 79–97)
Monocytes Absolute: 0.6 10*3/uL (ref 0.1–0.9)
Monocytes: 13 %
Neutrophils Absolute: 3 10*3/uL (ref 1.4–7.0)
Neutrophils: 61 %
Platelets: 276 10*3/uL (ref 150–450)
RBC: 4.78 x10E6/uL (ref 4.14–5.80)
RDW: 14.5 % (ref 11.6–15.4)
WBC: 4.9 10*3/uL (ref 3.4–10.8)

## 2023-01-21 LAB — IRON,TIBC AND FERRITIN PANEL
Ferritin: 68 ng/mL (ref 30–400)
Iron Saturation: 28 % (ref 15–55)
Iron: 88 ug/dL (ref 38–169)
Total Iron Binding Capacity: 315 ug/dL (ref 250–450)
UIBC: 227 ug/dL (ref 111–343)

## 2023-01-22 NOTE — Telephone Encounter (Signed)
Lab results have returned from [facility] - results printed and placed in Dr. Doyne Keel office for review.

## 2023-01-24 ENCOUNTER — Encounter: Payer: Self-pay | Admitting: Gastroenterology

## 2023-01-27 ENCOUNTER — Other Ambulatory Visit: Payer: Self-pay

## 2023-01-27 DIAGNOSIS — D509 Iron deficiency anemia, unspecified: Secondary | ICD-10-CM

## 2023-01-27 NOTE — Progress Notes (Signed)
Orders for labs in July mailed to patient so he can go to his local [facility]

## 2023-01-28 ENCOUNTER — Ambulatory Visit: Payer: Managed Care, Other (non HMO) | Admitting: Gastroenterology

## 2023-02-09 ENCOUNTER — Other Ambulatory Visit: Payer: Self-pay | Admitting: Family Medicine

## 2023-02-10 NOTE — Telephone Encounter (Signed)
Refill request Tramadol Last refill 12/11/22 #50 Last office visit 12/11/22

## 2023-02-11 MED ORDER — TRAMADOL HCL 50 MG PO TABS: ORAL_TABLET | ORAL | 0 refills | Status: DC

## 2023-02-11 NOTE — Telephone Encounter (Signed)
ERx 

## 2023-04-09 ENCOUNTER — Other Ambulatory Visit: Payer: Self-pay | Admitting: Family Medicine

## 2023-04-09 DIAGNOSIS — M545 Low back pain, unspecified: Secondary | ICD-10-CM

## 2023-04-09 NOTE — Telephone Encounter (Signed)
Name of Medication: Tramadol Name of Pharmacy: [facility] [address] Last Fill or Written Date and Quantity: 02/11/23, #50 Last Office Visit and Type: 12/11/22, CPE Next Office Visit and Type: none Last Controlled Substance Agreement Date: none Last UDS: none

## 2023-04-10 MED ORDER — TRAMADOL HCL 50 MG PO TABS: ORAL_TABLET | ORAL | 0 refills | Status: DC

## 2023-04-10 NOTE — Telephone Encounter (Signed)
ERx 

## 2023-04-22 ENCOUNTER — Encounter: Payer: Self-pay | Admitting: Family Medicine

## 2023-04-23 NOTE — Telephone Encounter (Signed)
Received faxed FMLA ppw. Placed in Dr. Timoteo Expose box.

## 2023-04-30 ENCOUNTER — Encounter: Payer: Self-pay | Admitting: Family Medicine

## 2023-05-02 DIAGNOSIS — Z0279 Encounter for issue of other medical certificate: Secondary | ICD-10-CM

## 2023-05-02 NOTE — Telephone Encounter (Addendum)
Forms filled and in Lisa's box ?

## 2023-05-02 NOTE — Telephone Encounter (Addendum)
Faxed FMLA ppw to [employer] at [phone].  [Original form is in basket on Lisa's desk pending pt's response. Made copies to be scanned and for billing.]

## 2023-05-19 ENCOUNTER — Telehealth: Payer: Self-pay

## 2023-05-19 NOTE — Telephone Encounter (Signed)
-----   Message from Cooper Render, CMA sent at 01/22/2023  9:23 AM EST ----- Regarding: due for labs CBC and TIBC / ferritin in early July

## 2023-05-19 NOTE — Telephone Encounter (Signed)
Patient was mailed printed lab orders in March to take to his local [facility] for labs in July. Reminder sent to his MyChart to go to the lab in the next couple of weeks

## 2023-05-27 LAB — CBC WITH DIFFERENTIAL/PLATELET
Basophils Absolute: 0.1 10*3/uL (ref 0.0–0.2)
Basos: 1 %
EOS (ABSOLUTE): 0.1 10*3/uL (ref 0.0–0.4)
Eos: 1 %
Hematocrit: 39 % (ref 37.5–51.0)
Hemoglobin: 12.6 g/dL — ABNORMAL LOW (ref 13.0–17.7)
Immature Grans (Abs): 0 10*3/uL (ref 0.0–0.1)
Immature Granulocytes: 1 %
Lymphocytes Absolute: 1.1 10*3/uL (ref 0.7–3.1)
Lymphs: 17 %
MCH: 26.4 pg — ABNORMAL LOW (ref 26.6–33.0)
MCHC: 32.3 g/dL (ref 31.5–35.7)
MCV: 82 fL (ref 79–97)
Monocytes Absolute: 0.7 10*3/uL (ref 0.1–0.9)
Monocytes: 12 %
Neutrophils Absolute: 4.3 10*3/uL (ref 1.4–7.0)
Neutrophils: 68 %
Platelets: 284 10*3/uL (ref 150–450)
RBC: 4.77 x10E6/uL (ref 4.14–5.80)
RDW: 13.8 % (ref 11.6–15.4)
WBC: 6.2 10*3/uL (ref 3.4–10.8)

## 2023-05-27 LAB — IRON,TIBC AND FERRITIN PANEL
Ferritin: 87 ng/mL (ref 30–400)
Iron Saturation: 32 % (ref 15–55)
Iron: 97 ug/dL (ref 38–169)
Total Iron Binding Capacity: 306 ug/dL (ref 250–450)
UIBC: 209 ug/dL (ref 111–343)

## 2023-05-30 ENCOUNTER — Ambulatory Visit: Payer: Managed Care, Other (non HMO) | Admitting: Family Medicine

## 2023-06-06 ENCOUNTER — Encounter: Payer: Self-pay | Admitting: Gastroenterology

## 2023-06-06 ENCOUNTER — Telehealth: Payer: Self-pay | Admitting: Gastroenterology

## 2023-06-06 ENCOUNTER — Other Ambulatory Visit: Payer: Self-pay | Admitting: Family Medicine

## 2023-06-06 DIAGNOSIS — G8929 Other chronic pain: Secondary | ICD-10-CM

## 2023-06-06 MED ORDER — TRAMADOL HCL 50 MG PO TABS: ORAL_TABLET | ORAL | 0 refills | Status: DC

## 2023-06-06 NOTE — Telephone Encounter (Signed)
ERx 

## 2023-06-06 NOTE — Telephone Encounter (Signed)
This has been addressed. See 06/06/23 patient message

## 2023-06-06 NOTE — Telephone Encounter (Signed)
Received MyChart message.  Patient is requesting that you mail him another blood order to be done prior to his appointment 09/25/23 with Dr. Adela Lank.  Thank you.

## 2023-06-06 NOTE — Telephone Encounter (Signed)
Name of Medication: Tramadol Name of Pharmacy: [facility] [address] Last Fill or Written Date and Quantity: 04/10/23, #50 Last Office Visit and Type: 12/11/22, CPE Next Office Visit and Type: none Last Controlled Substance Agreement Date: none Last UDS: none

## 2023-07-27 ENCOUNTER — Encounter: Payer: Self-pay | Admitting: Family Medicine

## 2023-07-27 DIAGNOSIS — E785 Hyperlipidemia, unspecified: Secondary | ICD-10-CM

## 2023-07-27 DIAGNOSIS — N401 Enlarged prostate with lower urinary tract symptoms: Secondary | ICD-10-CM

## 2023-08-06 ENCOUNTER — Other Ambulatory Visit: Payer: Self-pay | Admitting: Family Medicine

## 2023-08-06 DIAGNOSIS — G8929 Other chronic pain: Secondary | ICD-10-CM

## 2023-08-08 MED ORDER — TRAMADOL HCL 50 MG PO TABS
ORAL_TABLET | ORAL | 0 refills | Status: DC
Start: 2023-08-08 — End: 2023-09-25

## 2023-08-08 NOTE — Telephone Encounter (Signed)
ERx 

## 2023-09-25 ENCOUNTER — Ambulatory Visit: Payer: Managed Care, Other (non HMO) | Admitting: Gastroenterology

## 2023-09-25 ENCOUNTER — Other Ambulatory Visit: Payer: Self-pay | Admitting: Family Medicine

## 2023-09-25 DIAGNOSIS — G8929 Other chronic pain: Secondary | ICD-10-CM

## 2023-09-25 MED ORDER — TRAMADOL HCL 50 MG PO TABS
ORAL_TABLET | ORAL | 0 refills | Status: DC
Start: 2023-09-25 — End: 2023-11-03

## 2023-09-25 NOTE — Telephone Encounter (Signed)
Refill request for   traMADol (ULTRAM) 50 MG tablet    LOV - 12/11/22 Next OV - 12/10/23 Last refill - 08/08/23 #50/0

## 2023-09-25 NOTE — Telephone Encounter (Signed)
ERx 

## 2023-10-20 ENCOUNTER — Encounter: Payer: Self-pay | Admitting: Family Medicine

## 2023-10-20 DIAGNOSIS — K648 Other hemorrhoids: Secondary | ICD-10-CM

## 2023-10-21 NOTE — Telephone Encounter (Signed)
Received faxed FMLA ppw. Placed ppw in Dr Timoteo Expose box.

## 2023-10-23 ENCOUNTER — Encounter: Payer: Self-pay | Admitting: Family Medicine

## 2023-10-23 NOTE — Telephone Encounter (Addendum)
Requesting FMLA intermittent leave for 6 days per month for hemorrhoids with symptomatic IDA, knee pain, back arthritis, neck strain.  Last hemorrhoid treatment by GI was 08/2022, this should be resolved.  Anticipate still has intermittent arthritis flares and has declined orthopedic evaluation - will need to verify frequency of flares.   I recommend office visit to review FMLA request for the coming year in detail.  I let him know this through MyChart.

## 2023-11-03 ENCOUNTER — Ambulatory Visit: Payer: Managed Care, Other (non HMO) | Admitting: Family Medicine

## 2023-11-03 ENCOUNTER — Ambulatory Visit (INDEPENDENT_AMBULATORY_CARE_PROVIDER_SITE_OTHER)
Admission: RE | Admit: 2023-11-03 | Discharge: 2023-11-03 | Disposition: A | Discharge status: Home / Self Care | Payer: Managed Care, Other (non HMO) | Source: Ambulatory Visit | Attending: Family Medicine | Admitting: Family Medicine

## 2023-11-03 ENCOUNTER — Encounter: Payer: Self-pay | Admitting: Family Medicine

## 2023-11-03 VITALS — BP 138/78 | HR 108 | Temp 98.5°F | Ht 66.75 in | Wt 230.5 lb

## 2023-11-03 DIAGNOSIS — G8929 Other chronic pain: Secondary | ICD-10-CM

## 2023-11-03 DIAGNOSIS — M25561 Pain in right knee: Secondary | ICD-10-CM | POA: Diagnosis not present

## 2023-11-03 DIAGNOSIS — M542 Cervicalgia: Secondary | ICD-10-CM

## 2023-11-03 DIAGNOSIS — K648 Other hemorrhoids: Secondary | ICD-10-CM

## 2023-11-03 DIAGNOSIS — Z636 Dependent relative needing care at home: Secondary | ICD-10-CM

## 2023-11-03 DIAGNOSIS — Z8719 Personal history of other diseases of the digestive system: Secondary | ICD-10-CM

## 2023-11-03 DIAGNOSIS — Z9889 Other specified postprocedural states: Secondary | ICD-10-CM

## 2023-11-03 DIAGNOSIS — M545 Low back pain, unspecified: Secondary | ICD-10-CM

## 2023-11-03 DIAGNOSIS — D500 Iron deficiency anemia secondary to blood loss (chronic): Secondary | ICD-10-CM

## 2023-11-03 DIAGNOSIS — M25562 Pain in left knee: Secondary | ICD-10-CM

## 2023-11-03 MED ORDER — TRAMADOL HCL 50 MG PO TABS
ORAL_TABLET | ORAL | 0 refills | Status: DC
Start: 2023-11-03 — End: 2023-12-04

## 2023-11-03 NOTE — Patient Instructions (Addendum)
I will work on [employer] forms.  Neck xrays today.  I have refilled tramadol chronic pain medicine - check with pharmacy about getting full supply.  I do recommend physical therapy course -referral placed [facility]).  Good to see you today.

## 2023-11-03 NOTE — Progress Notes (Unsigned)
Ph: [phone] Fax: [phone]   Patient ID: [name] [name], male    DOB: [birth year], 57 y.o.   MRN: [id number]  This visit was conducted in person.  BP 138/78   Pulse (!) 108   Temp 98.5 F (36.9 C) (Oral)   Ht 5' 6.75" (1.695 m)   Wt 230 lb 8 oz (104.6 kg)   SpO2 99%   BMI 36.37 kg/m    CC: discuss FMLA Subjective:   HPI: [name] [name] is a 57 y.o. male presenting on 11/03/2023 for Form Completion (Here for FMLA review. )   Requests FMLA forms updated.  Requesting intermittent FMLA leave for 6d/month.  Initially started for symptomatic anemic from hemorrhoids s/p banding x3 latest 08/2022 ([facility]), and inguinal hernia s/p repair 02/2022. He continues oral iron BID and omeprazole 40mg  daily. Last saw GI 06/2022, plan was 6 mo f/u in office, pt overdue.   Also on FMLA for chronic knee pain, back pain, neck pain. Hasn't been evaluated for this recently.  Takes tramadol BID PRN, takes about 4x/wk, also on robaxin PRN.   Worst pain is R knee - especially bad with stairs at home and work. Also with pain to lower back worse when getting out of bed and after prolonged immobility ie sitting at work. Only gets 2 breaks a day - one for lunch and one other one. Pain when exacerbated can limit ability to meet metrics at work.  Pain worsens as day goes on suggestive of degenerative, not inflammatory arthritis.   Last knee and spine imaging 01/2020: LUMBAR SPINE - COMPLETE 4+ VIEW IMPRESSION: Mild levoconvex scoliosis lumbar spine. Degenerative disc disease changes L4-L5, mild.  No acute abnormalities.  RIGHT KNEE; LEFT KNEE - COMPLETE 4+ VIEW IMPRESSION: Mild degenerative changes of both knees.  No redness, swelling, warmth of joints.   Also has chronic neck pain with limited ROM in bilateral rotation - this has not been evaluated.  No shooting pain down arms or paresthesias, numbness/weakness, bowel/bladder accidents or saddle anesthesia, no inciting trauma/injury.    He also  continues caring for wife - who has had stroke with residual bilateral foot drop, has had 6 surgeries. She has shower seat, wheelchair, walker, bedside commode. She does not drive so he takes her to medical appointments, about once a month.      Relevant past medical, surgical, family and social history reviewed and updated as indicated. Interim medical history since our last visit reviewed. Allergies and medications reviewed and updated. Outpatient Medications Prior to Visit  Medication Sig Dispense Refill   acetaminophen (TYLENOL) 500 MG tablet Take 2 tablets (1,000 mg total) by mouth 3 (three) times daily as needed for moderate pain.     amLODipine (NORVASC) 10 MG tablet Take 1 tablet (10 mg total) by mouth daily. 90 tablet 4   diclofenac Sodium (VOLTAREN) 1 % GEL Apply 2 g topically 3 (three) times daily. (Patient taking differently: Apply 2 g topically daily as needed (pain).) 100 g 3   docusate sodium (COLACE) 100 MG capsule Take 1 capsule (100 mg total) by mouth daily. (Patient taking differently: Take 100 mg by mouth daily as needed for mild constipation or moderate constipation.)     ferrous sulfate 325 (65 FE) MG tablet Take 1 tablet (325 mg total) by mouth 2 (two) times daily with a meal.     hydrochlorothiazide (HYDRODIURIL) 25 MG tablet Take 1 tablet (25 mg total) by mouth daily. 90 tablet 4   hydrOXYzine (ATARAX) 25 MG  tablet Take 0.5-1 tablets (12.5-25 mg total) by mouth 2 (two) times daily as needed for anxiety (sedation precautions). 30 tablet 1   methocarbamol (ROBAXIN) 500 MG tablet Take 1 tablet (500 mg total) by mouth 2 (two) times daily as needed for muscle spasms (sedation precautions). 30 tablet 3   omeprazole (PRILOSEC) 40 MG capsule Take 1 capsule (40 mg total) by mouth daily. 90 capsule 1   psyllium (METAMUCIL SMOOTH TEXTURE) 58.6 % powder Take 1 packet by mouth daily.     traMADol (ULTRAM) 50 MG tablet TAKE 1 TABLET BY MOUTH EVERY 12 HOURS AS NEEDED FOR MODERATE PAIN 50  tablet 0   No facility-administered medications prior to visit.     Per HPI unless specifically indicated in ROS section below Review of Systems  Objective:  BP 138/78   Pulse (!) 108   Temp 98.5 F (36.9 C) (Oral)   Ht 5' 6.75" (1.695 m)   Wt 230 lb 8 oz (104.6 kg)   SpO2 99%   BMI 36.37 kg/m   Wt Readings from Last 3 Encounters:  11/03/23 230 lb 8 oz (104.6 kg)  12/11/22 218 lb 6 oz (99.1 kg)  08/21/22 215 lb (97.5 kg)      Physical Exam Vitals and nursing note reviewed.  Constitutional:      Appearance: Normal appearance. He is not ill-appearing.  Neck:     Comments:  Midline cervical spine tenderness at C3/4 > C7 Mild paracervical mm tenderness Limited ROM predominantly to bilateral lateral rotation  Cardiovascular:     Rate and Rhythm: Normal rate and regular rhythm.     Pulses: Normal pulses.     Heart sounds: Normal heart sounds. No murmur heard. Pulmonary:     Effort: Pulmonary effort is normal. No respiratory distress.     Breath sounds: Normal breath sounds. No wheezing, rhonchi or rales.  Musculoskeletal:     Cervical back: Normal range of motion and neck supple. Tenderness present. No rigidity.     Right lower leg: No edema.     Left lower leg: No edema.     Comments:  Back exam: Tender midline spine + paraspinous mm tenderness Neg SLR bilaterally. No significant pain with int/ext rotation at hip. Neg FABER.  Discomfort at SIJ, and sciatic notch bilaterally, no pain at GTB.   Bilateral knee exam: No deformity on inspection. No pain with palpation of knee landmarks. No effusion/swelling noted. FROM in flex/extension without crepitus. No popliteal fullness. Neg drawer test. Neg mcmurray test. No pain with valgus/varus stress. No PFgrind. No abnormal patellar mobility.   Lymphadenopathy:     Cervical: No cervical adenopathy.  Skin:    General: Skin is warm and dry.     Findings: No rash.  Neurological:     Mental Status: He is alert.      Deep Tendon Reflexes:     Reflex Scores:      Patellar reflexes are 2+ on the right side and 2+ on the left side.      Achilles reflexes are 2+ on the right side and 2+ on the left side.    Comments:  5/5 strength BUE, BLE  Sensation intact Neg spurling bilaterally  Psychiatric:        Mood and Affect: Mood normal.        Behavior: Behavior normal.       Results for orders placed or performed in visit on 01/27/23  Iron, TIBC and Ferritin Panel   Collection Time: 05/26/23  9:30 AM  Result Value Ref Range   Total Iron Binding Capacity 306 250 - 450 ug/dL   UIBC 161 096 - 045 ug/dL   Iron 97 38 - 409 ug/dL   Iron Saturation 32 15 - 55 %   Ferritin 87 30 - 400 ng/mL  CBC with Differential/Platelet   Collection Time: 05/26/23  9:30 AM  Result Value Ref Range   WBC 6.2 3.4 - 10.8 x10E3/uL   RBC 4.77 4.14 - 5.80 x10E6/uL   Hemoglobin 12.6 (L) 13.0 - 17.7 g/dL   Hematocrit 81.1 91.4 - 51.0 %   MCV 82 79 - 97 fL   MCH 26.4 (L) 26.6 - 33.0 pg   MCHC 32.3 31.5 - 35.7 g/dL   RDW 78.2 95.6 - 21.3 %   Platelets 284 150 - 450 x10E3/uL   Neutrophils 68 Not Estab. %   Lymphs 17 Not Estab. %   Monocytes 12 Not Estab. %   Eos 1 Not Estab. %   Basos 1 Not Estab. %   Neutrophils Absolute 4.3 1.4 - 7.0 x10E3/uL   Lymphocytes Absolute 1.1 0.7 - 3.1 x10E3/uL   Monocytes Absolute 0.7 0.1 - 0.9 x10E3/uL   EOS (ABSOLUTE) 0.1 0.0 - 0.4 x10E3/uL   Basophils Absolute 0.1 0.0 - 0.2 x10E3/uL   Immature Granulocytes 1 Not Estab. %   Immature Grans (Abs) 0.0 0.0 - 0.1 x10E3/uL   Lab Results  Component Value Date   IRON 97 05/26/2023   TIBC 306 05/26/2023   FERRITIN 87 05/26/2023   Assessment & Plan:   Problem List Items Addressed This Visit     Severe obesity (BMI 35.0-39.9) with comorbidity (HCC)   Encouraged working on weight loss to help osteoarthritis pain      Iron deficiency anemia   Presumed from hemorrhoidal bleed s/p hemorrhoid treatment 2023 with significant improvement. He's  continued oral iron BID dosing.  Update labs through work - will forward results to GI.  Encouraged GI f/u.       Relevant Orders   Iron, TIBC and Ferritin Panel   CBC with Differential/Platelet   Iron and TIBC   Ferritin   Bilateral knee pain   Anticipate osteoarthritis related - mild OA changes on last knee imaging 2021.  Continue tylenol , tramadol, robaxin PRN.  Refer to PT. Discussed sports med vs ortho eval as next steps if desires further evaluation.       Relevant Orders   Ambulatory referral to Physical Therapy   CBC with Differential/Platelet   Iron and TIBC   Ferritin   Chronic low back pain - Primary   Chronic, previous imaging 2021 showing mild scoliosis and mild DDD at L4/5. Continue tylenol, tramadol, robaxin PRN.  Refer to PT.  Will fill out FMLA for this.  Encouraged ongoing efforts for weight loss.  To let us know if desires further eval through PM&R.       Relevant Medications   traMADol (ULTRAM) 50 MG tablet   Other Relevant Orders   Ambulatory referral to Physical Therapy   CBC with Differential/Platelet   Iron and TIBC   Ferritin   Status post right inguinal hernia repair   Internal hemorrhoids   Chronic cervical pain   Chronic issue, predominantly tender to palpation midline spine.  Anticipate cervical DDD in h/o known lumbar DDD.  No symptoms or signs of radiculopathy or myelopathy, no trauma, no red flags.  Check baseline films today.  Refer to PT.  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 Will add this to [facility] request.       Encounter for chronic pain management   [place] CSRS  reviewed.  Has been having trouble getting full tramadol  supply #50/mo - I've sent notice to pharmacy that this is for chronic pain management for presumed osteoarthritis pain to knees, lower back, and cervical neck.         Meds ordered this encounter  Medications   traMADol (ULTRAM) 50 MG tablet    Sig: TAKE 1 TABLET BY MOUTH EVERY 12 HOURS AS NEEDED FOR MODERATE PAIN - for chronic pain    Dispense:  50 tablet    Refill:  0    chronic pain medication    Orders Placed This Encounter  Procedures   DG Cervical Spine Complete    Standing Status:   Future    Number of Occurrences:   1    Expiration Date:   11/02/2024    Reason for Exam (SYMPTOM  OR DIAGNOSIS REQUIRED):   chronic midline neck pain , worse at upper cervical region    Preferred imaging location?:   [facility]   Iron, TIBC and Ferritin Panel    Standing Status:   Future    Number of Occurrences:   1    Expiration Date:   11/02/2024   CBC with Differential/Platelet   Iron and TIBC   Ferritin   Ambulatory referral to Physical Therapy    Referral Priority:   Routine    Referral Type:   Physical Medicine    Referral Reason:   Specialty Services Required    Requested Specialty:   Physical Therapy    Number of Visits Requested:   1    Patient Instructions  I will work on FMLA forms.  Neck xrays today.  I have refilled tramadol chronic pain medicine - check with pharmacy about getting full supply.  I do recommend physical therapy course -referral placed [facility]).  Good to see you today.   Follow up plan: Return if symptoms worsen or fail to improve.  Eustaquio Boyden, MD

## 2023-11-06 DIAGNOSIS — Z636 Dependent relative needing care at home: Secondary | ICD-10-CM | POA: Insufficient documentation

## 2023-11-06 DIAGNOSIS — G8929 Other chronic pain: Secondary | ICD-10-CM | POA: Insufficient documentation

## 2023-11-06 NOTE — Assessment & Plan Note (Addendum)
Chronic, previous imaging 2021 showing mild scoliosis and mild DDD at L4/5. Continue tylenol, tramadol, robaxin PRN.  Refer to PT.  Will fill out FMLA for this.  Encouraged ongoing efforts for weight loss.  To let us know if desires further eval through PM&R.

## 2023-11-06 NOTE — Assessment & Plan Note (Signed)
Chronic issue, predominantly tender to palpation midline spine.  Anticipate cervical DDD in h/o known lumbar DDD.  No symptoms or signs of radiculopathy or myelopathy, no trauma, no red flags.  Check baseline films today.  Refer to PT.

## 2023-11-06 NOTE — Assessment & Plan Note (Signed)
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 Will add this to [facility] request.

## 2023-11-06 NOTE — Assessment & Plan Note (Signed)
Encouraged working on weight loss to help osteoarthritis pain

## 2023-11-06 NOTE — Assessment & Plan Note (Addendum)
[place] CSRS  reviewed.  Has been having trouble getting full tramadol supply #50/mo - I've sent notice to pharmacy that this is for chronic pain management for presumed osteoarthritis pain to knees, lower back, and cervical neck.

## 2023-11-06 NOTE — Assessment & Plan Note (Addendum)
Presumed from hemorrhoidal bleed s/p hemorrhoid treatment 2023 with significant improvement. He's continued oral iron BID dosing.  Update labs through work - will forward results to GI.  Encouraged GI f/u.

## 2023-11-06 NOTE — Assessment & Plan Note (Addendum)
Anticipate osteoarthritis related - mild OA changes on last knee imaging 2021.  Continue tylenol , tramadol, robaxin PRN.  Refer to PT. Discussed sports med vs ortho eval as next steps if desires further evaluation.

## 2023-11-07 NOTE — Telephone Encounter (Signed)
Faxed FMLA ppw to [employer] at [phone].   Mailed original to pt and made copy to scan.

## 2023-11-22 LAB — COMPREHENSIVE METABOLIC PANEL
ALT: 15 [IU]/L (ref 0–44)
AST: 14 [IU]/L (ref 0–40)
Albumin: 4.3 g/dL (ref 3.8–4.9)
Alkaline Phosphatase: 41 [IU]/L — ABNORMAL LOW (ref 44–121)
BUN/Creatinine Ratio: 15 (ref 9–20)
BUN: 16 mg/dL (ref 6–24)
Bilirubin Total: 0.5 mg/dL (ref 0.0–1.2)
CO2: 22 mmol/L (ref 20–29)
Calcium: 9.4 mg/dL (ref 8.7–10.2)
Chloride: 101 mmol/L (ref 96–106)
Creatinine, Ser: 1.08 mg/dL (ref 0.76–1.27)
Globulin, Total: 2.1 g/dL (ref 1.5–4.5)
Glucose: 96 mg/dL (ref 70–99)
Potassium: 4.1 mmol/L (ref 3.5–5.2)
Sodium: 138 mmol/L (ref 134–144)
Total Protein: 6.4 g/dL (ref 6.0–8.5)
eGFR: 80 mL/min/{1.73_m2} (ref >59)

## 2023-11-22 LAB — CBC WITH DIFFERENTIAL/PLATELET
Basophils Absolute: 0 10*3/uL (ref 0.0–0.2)
Basos: 1 %
EOS (ABSOLUTE): 0.1 10*3/uL (ref 0.0–0.4)
Eos: 2 %
Hematocrit: 35.5 % — ABNORMAL LOW (ref 37.5–51.0)
Hemoglobin: 11.3 g/dL — ABNORMAL LOW (ref 13.0–17.7)
Immature Grans (Abs): 0 10*3/uL (ref 0.0–0.1)
Immature Granulocytes: 0 %
Lymphocytes Absolute: 1.2 10*3/uL (ref 0.7–3.1)
Lymphs: 22 %
MCH: 25.5 pg — ABNORMAL LOW (ref 26.6–33.0)
MCHC: 31.8 g/dL (ref 31.5–35.7)
MCV: 80 fL (ref 79–97)
Monocytes Absolute: 0.7 10*3/uL (ref 0.1–0.9)
Monocytes: 13 %
Neutrophils Absolute: 3.3 10*3/uL (ref 1.4–7.0)
Neutrophils: 62 %
Platelets: 287 10*3/uL (ref 150–450)
RBC: 4.44 x10E6/uL (ref 4.14–5.80)
RDW: 14.6 % (ref 11.6–15.4)
WBC: 5.2 10*3/uL (ref 3.4–10.8)

## 2023-11-22 LAB — IRON AND TIBC
Iron Saturation: 24 % (ref 15–55)
Iron: 70 ug/dL (ref 38–169)
Total Iron Binding Capacity: 292 ug/dL (ref 250–450)
UIBC: 222 ug/dL (ref 111–343)

## 2023-11-22 LAB — LIPID PANEL
Chol/HDL Ratio: 5 {ratio} (ref 0.0–5.0)
Cholesterol, Total: 234 mg/dL — ABNORMAL HIGH (ref 100–199)
HDL: 47 mg/dL (ref >39)
LDL Chol Calc (NIH): 168 mg/dL — ABNORMAL HIGH (ref 0–99)
Triglycerides: 107 mg/dL (ref 0–149)
VLDL Cholesterol Cal: 19 mg/dL (ref 5–40)

## 2023-11-22 LAB — PSA: Prostate Specific Ag, Serum: 1.5 ng/mL (ref 0.0–4.0)

## 2023-11-22 LAB — FERRITIN: Ferritin: 37 ng/mL (ref 30–400)

## 2023-11-25 ENCOUNTER — Telehealth: Payer: Self-pay | Admitting: Gastroenterology

## 2023-11-25 NOTE — Telephone Encounter (Signed)
 PT advised he was returning call to let us know that due to unfortunate circumstances, he cannot schedule a OV at the moment and will do so when it is accommodating.

## 2023-12-03 ENCOUNTER — Other Ambulatory Visit: Payer: Self-pay | Admitting: Family Medicine

## 2023-12-03 DIAGNOSIS — M545 Low back pain, unspecified: Secondary | ICD-10-CM

## 2023-12-03 NOTE — Telephone Encounter (Signed)
 Duplicate request (see other 12/03/23 refill note).

## 2023-12-03 NOTE — Telephone Encounter (Signed)
 Name of Medication: Tramadol  Name of Pharmacy: [facility] [address] Last Fill or Written Date and Quantity: 11/03/23, #50 Last Office Visit and Type: 11/11/23, FMLA review Next Office Visit and Type: 12/10/23, CPE Last Controlled Substance Agreement Date: none Last UDS: none

## 2023-12-04 NOTE — Telephone Encounter (Signed)
ERx 

## 2023-12-10 ENCOUNTER — Ambulatory Visit (INDEPENDENT_AMBULATORY_CARE_PROVIDER_SITE_OTHER): Payer: Managed Care, Other (non HMO) | Admitting: Family Medicine

## 2023-12-10 ENCOUNTER — Encounter: Payer: Self-pay | Admitting: Family Medicine

## 2023-12-10 VITALS — BP 124/84 | HR 100 | Temp 98.1°F | Ht 66.75 in | Wt 229.5 lb

## 2023-12-10 DIAGNOSIS — F419 Anxiety disorder, unspecified: Secondary | ICD-10-CM

## 2023-12-10 DIAGNOSIS — Z0001 Encounter for general adult medical examination with abnormal findings: Secondary | ICD-10-CM

## 2023-12-10 DIAGNOSIS — D500 Iron deficiency anemia secondary to blood loss (chronic): Secondary | ICD-10-CM

## 2023-12-10 DIAGNOSIS — I10 Essential (primary) hypertension: Secondary | ICD-10-CM | POA: Diagnosis not present

## 2023-12-10 DIAGNOSIS — E785 Hyperlipidemia, unspecified: Secondary | ICD-10-CM

## 2023-12-10 DIAGNOSIS — K219 Gastro-esophageal reflux disease without esophagitis: Secondary | ICD-10-CM

## 2023-12-10 DIAGNOSIS — K760 Fatty (change of) liver, not elsewhere classified: Secondary | ICD-10-CM

## 2023-12-10 MED ORDER — AMLODIPINE BESYLATE 10 MG PO TABS: 10.0000 mg | ORAL_TABLET | Freq: Every day | ORAL | 4 refills | Status: DC

## 2023-12-10 MED ORDER — FERROUS SULFATE 325 (65 FE) MG PO TABS: 325.0000 mg | ORAL_TABLET | Freq: Every day | ORAL | Status: AC

## 2023-12-10 MED ORDER — HYDROXYZINE HCL 25 MG PO TABS: 12.5000 mg | ORAL_TABLET | Freq: Two times a day (BID) | ORAL | 2 refills | Status: DC | PRN

## 2023-12-10 MED ORDER — HYDROCHLOROTHIAZIDE 25 MG PO TABS: 25.0000 mg | ORAL_TABLET | Freq: Every day | ORAL | 4 refills | Status: DC

## 2023-12-10 MED ORDER — ATORVASTATIN CALCIUM 20 MG PO TABS: 20.0000 mg | ORAL_TABLET | Freq: Every day | ORAL | 3 refills | Status: DC

## 2023-12-10 NOTE — Patient Instructions (Addendum)
Consider tetanus and shingles shots.  Trial atorvastatin 20mg  daily for cholesterol. Goal LDL <130, ideally <100.  Consider coronary cacium score for CAD screening.  Continue oral iron daily. Follow up with GI about developing iron deficiency anemia despite omeprazole and oral iron replacement.  Good to see you today Return as needed or in 1 year for next physical

## 2023-12-10 NOTE — Assessment & Plan Note (Signed)
Chronic, stable on current regimen - continue. 

## 2023-12-10 NOTE — Assessment & Plan Note (Signed)
Discussed developing IDA despite oral iron replacement, presumed from hemorrhoidal bleed, ?cameron ulcers. Encouraged GI f/u.

## 2023-12-10 NOTE — Assessment & Plan Note (Signed)
Continue omeprazole 40 mg daily

## 2023-12-10 NOTE — Assessment & Plan Note (Signed)
Preventative protocols reviewed and updated unless pt declined. Discussed healthy diet and lifestyle.  

## 2023-12-10 NOTE — Assessment & Plan Note (Addendum)
Chronic, not on statin. Reviewed ASCVD risk - and starting statin vs further CAD screen with coronary calcium score. He agrees to start statin. Will start atorvastatin 20mg  daily and recheck chol levels in 6 months at his work.  The 10-year ASCVD risk score (Arnett DK, et al., 2019) is: 12.5%   Values used to calculate the score:     Age: 58 years     Sex: Male     Is Non-Hispanic African American: Yes     Diabetic: No     Tobacco smoker: No     Systolic Blood Pressure: 124 mmHg     Is BP treated: Yes     HDL Cholesterol: 47 mg/dL     Total Cholesterol: 234 mg/dL

## 2023-12-10 NOTE — Assessment & Plan Note (Signed)
LFTs WNL except for mildly low ALP.

## 2023-12-10 NOTE — Progress Notes (Signed)
Ph: [phone] Fax: [phone]   Patient ID: [name] Odea., male    DOB: Sep 27, 1966, 58 y.o.   MRN: [id number]  This visit was conducted in person.  BP 124/84   Pulse 100   Temp 98.1 F (36.7 C) (Oral)   Ht 5' 6.75" (1.695 m)   Wt 229 lb 8 oz (104.1 kg)   SpO2 99%   BMI 36.21 kg/m    CC: CPE Subjective:   HPI: [name] Girvin. is a 58 y.o. male presenting on 12/10/2023 for Annual Exam   See recent note for details.  Presumed from hemorrhoidal bleed s/p hemorrhoid treatment 2023 with significant improvement vs cameron ulcers.  Continues oral iron replacement once daily as well as omeprazole 40mg  daily.  Has not scheduled GI f/u.   Preventative: COLONOSCOPY Date: 12/2012 int hem, diverticulosis ([facility])  COLONOSCOPY 05/2017 inflamed int hem possible source of IDA - offered EGD, diverticulosis, rpt 10 yrs ([facility])  UPPER GI ENDOSCOPY 07/10/2022 - 3cm HH, gastritis, neg H Pylori, empiric dilation ([facility]) Prostate - yearly PSA. Nocturia x2-3, some weakening of stream.  Lung cancer screening - not eligible Flu shot - declined  COVID vaccine - declined  Tetanus shot - 2011, declined.  Shingrix - discussed, declined Seat belt use discussed  Sunscreen use discussed. No changing moles on skin.  Sleep - averaging 8 hours/night Non smoker  Alcohol - none  Dentist q6 mo  Eye exam yearly  Bowel - no constipation Bladder - no incontinence   Billing specialist - [facility] Lives with wife and son Activity: tries to walk daily  Diet: good water, fruits/vegetables daily, fish 1x/wk      Relevant past medical, surgical, family and social history reviewed and updated as indicated. Interim medical history since our last visit reviewed. Allergies and medications reviewed and updated. Outpatient Medications Prior to Visit  Medication Sig Dispense Refill   acetaminophen (TYLENOL) 500 MG tablet Take 2 tablets (1,000 mg total) by mouth 3 (three) times daily as needed  for moderate pain.     diclofenac Sodium (VOLTAREN) 1 % GEL Apply 2 g topically 3 (three) times daily. (Patient taking differently: Apply 2 g topically daily as needed (pain).) 100 g 3   docusate sodium (COLACE) 100 MG capsule Take 1 capsule (100 mg total) by mouth daily. (Patient taking differently: Take 100 mg by mouth daily as needed for mild constipation or moderate constipation.)     methocarbamol (ROBAXIN) 500 MG tablet Take 1 tablet (500 mg total) by mouth 2 (two) times daily as needed for muscle spasms (sedation precautions). 30 tablet 3   omeprazole (PRILOSEC) 40 MG capsule Take 1 capsule (40 mg total) by mouth daily. 90 capsule 1   psyllium (METAMUCIL SMOOTH TEXTURE) 58.6 % powder Take 1 packet by mouth daily.     traMADol (ULTRAM) 50 MG tablet TAKE 1 TABLET BY MOUTH EVERY 12 HOURS AS NEEDED FOR MODERATE PAIN OR CHRONIC PAIN 50 tablet 0   amLODipine (NORVASC) 10 MG tablet Take 1 tablet (10 mg total) by mouth daily. 90 tablet 4   ferrous sulfate 325 (65 FE) MG tablet Take 1 tablet (325 mg total) by mouth 2 (two) times daily with a meal.     hydrochlorothiazide (HYDRODIURIL) 25 MG tablet Take 1 tablet (25 mg total) by mouth daily. 90 tablet 4   hydrOXYzine (ATARAX) 25 MG tablet Take 0.5-1 tablets (12.5-25 mg total) by mouth 2 (two) times daily as needed for anxiety (sedation precautions). 30 tablet 1  No facility-administered medications prior to visit.     Per HPI unless specifically indicated in ROS section below Review of Systems  Constitutional:  Negative for activity change, appetite change, chills, fatigue, fever and unexpected weight change.  HENT:  Negative for hearing loss.   Eyes:  Negative for visual disturbance.  Respiratory:  Negative for cough, chest tightness, shortness of breath and wheezing.   Cardiovascular:  Negative for chest pain, palpitations and leg swelling.  Gastrointestinal:  Negative for abdominal distention, abdominal pain, blood in stool, constipation,  diarrhea, nausea and vomiting.  Genitourinary:  Negative for difficulty urinating and hematuria.  Musculoskeletal:  Negative for arthralgias, myalgias and neck pain.  Skin:  Negative for rash.  Neurological:  Negative for dizziness, seizures, syncope and headaches.  Hematological:  Negative for adenopathy. Does not bruise/bleed easily.  Psychiatric/Behavioral:  Negative for dysphoric mood. The patient is not nervous/anxious.     Objective:  BP 124/84   Pulse 100   Temp 98.1 F (36.7 C) (Oral)   Ht 5' 6.75" (1.695 m)   Wt 229 lb 8 oz (104.1 kg)   SpO2 99%   BMI 36.21 kg/m   Wt Readings from Last 3 Encounters:  12/10/23 229 lb 8 oz (104.1 kg)  11/03/23 230 lb 8 oz (104.6 kg)  12/11/22 218 lb 6 oz (99.1 kg)      Physical Exam Vitals and nursing note reviewed.  Constitutional:      General: He is not in acute distress.    Appearance: Normal appearance. He is well-developed. He is not ill-appearing.  HENT:     Head: Normocephalic and atraumatic.     Right Ear: Hearing, tympanic membrane, ear canal and external ear normal.     Left Ear: Hearing, tympanic membrane, ear canal and external ear normal.     Mouth/Throat:     Mouth: Mucous membranes are moist.     Pharynx: Oropharynx is clear. No oropharyngeal exudate or posterior oropharyngeal erythema.  Eyes:     General: No scleral icterus.    Extraocular Movements: Extraocular movements intact.     Conjunctiva/sclera: Conjunctivae normal.     Pupils: Pupils are equal, round, and reactive to light.  Neck:     Thyroid: No thyroid mass or thyromegaly.  Cardiovascular:     Rate and Rhythm: Normal rate and regular rhythm.     Pulses: Normal pulses.          Radial pulses are 2+ on the right side and 2+ on the left side.     Heart sounds: Normal heart sounds. No murmur heard. Pulmonary:     Effort: Pulmonary effort is normal. No respiratory distress.     Breath sounds: Normal breath sounds. No wheezing, rhonchi or rales.   Abdominal:     General: Bowel sounds are normal. There is no distension.     Palpations: Abdomen is soft. There is no mass.     Tenderness: There is no abdominal tenderness. There is no guarding or rebound.     Hernia: No hernia is present.  Musculoskeletal:        General: Normal range of motion.     Cervical back: Normal range of motion and neck supple.     Right lower leg: No edema.     Left lower leg: No edema.  Lymphadenopathy:     Cervical: No cervical adenopathy.  Skin:    General: Skin is warm and dry.     Findings: No rash.  Neurological:  General: No focal deficit present.     Mental Status: He is alert and oriented to person, place, and time.  Psychiatric:        Mood and Affect: Mood normal.        Behavior: Behavior normal.        Thought Content: Thought content normal.        Judgment: Judgment normal.       Results for orders placed or performed in visit on 11/03/23  CBC with Differential/Platelet   Collection Time: 11/21/23  7:07 AM  Result Value Ref Range   WBC 5.2 3.4 - 10.8 x10E3/uL   RBC 4.44 4.14 - 5.80 x10E6/uL   Hemoglobin 11.3 (L) 13.0 - 17.7 g/dL   Hematocrit 28.4 (L) 13.2 - 51.0 %   MCV 80 79 - 97 fL   MCH 25.5 (L) 26.6 - 33.0 pg   MCHC 31.8 31.5 - 35.7 g/dL   RDW 44.0 10.2 - 72.5 %   Platelets 287 150 - 450 x10E3/uL   Neutrophils 62 Not Estab. %   Lymphs 22 Not Estab. %   Monocytes 13 Not Estab. %   Eos 2 Not Estab. %   Basos 1 Not Estab. %   Neutrophils Absolute 3.3 1.4 - 7.0 x10E3/uL   Lymphocytes Absolute 1.2 0.7 - 3.1 x10E3/uL   Monocytes Absolute 0.7 0.1 - 0.9 x10E3/uL   EOS (ABSOLUTE) 0.1 0.0 - 0.4 x10E3/uL   Basophils Absolute 0.0 0.0 - 0.2 x10E3/uL   Immature Granulocytes 0 Not Estab. %   Immature Grans (Abs) 0.0 0.0 - 0.1 x10E3/uL  Iron and TIBC   Collection Time: 11/21/23  7:07 AM  Result Value Ref Range   Total Iron Binding Capacity 292 250 - 450 ug/dL   UIBC 366 440 - 347 ug/dL   Iron 70 38 - 425 ug/dL   Iron  Saturation 24 15 - 55 %  Ferritin   Collection Time: 11/21/23  7:07 AM  Result Value Ref Range   Ferritin 37 30 - 400 ng/mL    Assessment & Plan:   Problem List Items Addressed This Visit     Encounter for general adult medical examination with abnormal findings - Primary (Chronic)   Preventative protocols reviewed and updated unless pt declined. Discussed healthy diet and lifestyle.       Severe obesity (BMI 35.0-39.9) with comorbidity (HCC)   Continue to encourage healthy diet and lifestyle choices to affect sustainable weight loss.       Iron deficiency anemia   Discussed developing IDA despite oral iron replacement, presumed from hemorrhoidal bleed, ?cameron ulcers. Encouraged GI f/u.       Relevant Medications   ferrous sulfate 325 (65 FE) MG tablet   HTN (hypertension)   Chronic, stable on current regimen - continue.       Relevant Medications   amLODipine (NORVASC) 10 MG tablet   hydrochlorothiazide (HYDRODIURIL) 25 MG tablet   atorvastatin (LIPITOR) 20 MG tablet   HLD (hyperlipidemia)   Chronic, not on statin. Reviewed ASCVD risk - and starting statin vs further CAD screen with coronary calcium score. He agrees to start statin. Will start atorvastatin 20mg  daily and recheck chol levels in 6 months at his work.  The 10-year ASCVD risk score (Arnett DK, et al., 2019) is: 12.5%   Values used to calculate the score:     Age: 52 years     Sex: Male     Is Non-Hispanic African American: Yes  Diabetic: No     Tobacco smoker: No     Systolic Blood Pressure: 124 mmHg     Is BP treated: Yes     HDL Cholesterol: 47 mg/dL     Total Cholesterol: 234 mg/dL       Relevant Medications   amLODipine (NORVASC) 10 MG tablet   hydrochlorothiazide (HYDRODIURIL) 25 MG tablet   atorvastatin (LIPITOR) 20 MG tablet   Other Relevant Orders   Lipid panel   Hepatic function panel   Hepatic steatosis   LFTs WNL except for mildly low ALP.       GERD (gastroesophageal reflux  disease)   Continue omeprazole 40mg  daily.       Other Visit Diagnoses       Anxiety       Relevant Medications   hydrOXYzine (ATARAX) 25 MG tablet        Meds ordered this encounter  Medications   amLODipine (NORVASC) 10 MG tablet    Sig: Take 1 tablet (10 mg total) by mouth daily.    Dispense:  90 tablet    Refill:  4   hydrochlorothiazide (HYDRODIURIL) 25 MG tablet    Sig: Take 1 tablet (25 mg total) by mouth daily.    Dispense:  90 tablet    Refill:  4   hydrOXYzine (ATARAX) 25 MG tablet    Sig: Take 0.5-1 tablets (12.5-25 mg total) by mouth 2 (two) times daily as needed for anxiety (sedation precautions).    Dispense:  30 tablet    Refill:  2   ferrous sulfate 325 (65 FE) MG tablet    Sig: Take 1 tablet (325 mg total) by mouth daily with breakfast.   atorvastatin (LIPITOR) 20 MG tablet    Sig: Take 1 tablet (20 mg total) by mouth daily.    Dispense:  90 tablet    Refill:  3    Orders Placed This Encounter  Procedures   Lipid panel    Standing Status:   Future    Number of Occurrences:   1    Expiration Date:   12/09/2024   Hepatic function panel    Standing Status:   Future    Number of Occurrences:   1    Expiration Date:   12/09/2024    Patient Instructions  Consider tetanus and shingles shots.  Trial atorvastatin 20mg  daily for cholesterol. Goal LDL <130, ideally <100.  Consider coronary cacium score for CAD screening.  Continue oral iron daily. Follow up with GI about developing iron deficiency anemia despite omeprazole and oral iron replacement.  Good to see you today Return as needed or in 1 year for next physical   Follow up plan: Return in about 1 year (around 12/09/2024), or if symptoms worsen or fail to improve, for annual exam, prior fasting for blood work.  Eustaquio Boyden, MD

## 2023-12-10 NOTE — Assessment & Plan Note (Signed)
Continue to encourage healthy diet and lifestyle choices to affect sustainable weight loss.  °

## 2024-01-18 ENCOUNTER — Other Ambulatory Visit: Payer: Self-pay | Admitting: Family Medicine

## 2024-01-18 ENCOUNTER — Encounter: Payer: Self-pay | Admitting: Family Medicine

## 2024-01-18 DIAGNOSIS — M545 Low back pain, unspecified: Secondary | ICD-10-CM

## 2024-01-19 NOTE — Telephone Encounter (Signed)
 Name of Medication: Tramadol Name of Pharmacy: [facility] [address] Last Fill or Written Date and Quantity: 12/04/23, #50 Last Office Visit and Type: 12/10/23, CPE Next Office Visit and Type: none Last Controlled Substance Agreement Date: none Last UDS: none  Robaxin last rx:  10/29/22, #30

## 2024-01-20 MED ORDER — TRAMADOL HCL 50 MG PO TABS
ORAL_TABLET | ORAL | 0 refills | Status: DC
Start: 2024-01-20 — End: 2024-03-10

## 2024-01-20 MED ORDER — METHOCARBAMOL 500 MG PO TABS: 500.0000 mg | ORAL_TABLET | Freq: Two times a day (BID) | ORAL | 3 refills | Status: DC | PRN

## 2024-01-20 NOTE — Telephone Encounter (Signed)
 ERx

## 2024-03-10 ENCOUNTER — Other Ambulatory Visit: Payer: Self-pay | Admitting: Family Medicine

## 2024-03-10 DIAGNOSIS — D509 Iron deficiency anemia, unspecified: Secondary | ICD-10-CM

## 2024-03-10 DIAGNOSIS — M545 Low back pain, unspecified: Secondary | ICD-10-CM

## 2024-03-10 DIAGNOSIS — R1310 Dysphagia, unspecified: Secondary | ICD-10-CM

## 2024-03-10 MED ORDER — TRAMADOL HCL 50 MG PO TABS: ORAL_TABLET | ORAL | 0 refills | Status: DC

## 2024-03-10 MED ORDER — OMEPRAZOLE 40 MG PO CPDR: 40.0000 mg | DELAYED_RELEASE_CAPSULE | Freq: Every day | ORAL | 2 refills | Status: DC

## 2024-03-10 NOTE — Telephone Encounter (Signed)
 ERx

## 2024-03-10 NOTE — Telephone Encounter (Signed)
 Name of Medication: Tramadol  Name of Pharmacy: [facility] [address] Last Fill or Written Date and Quantity: 01/20/24, #50 Last Office Visit and Type: 12/10/23, CPE Next Office Visit and Type: none Last Controlled Substance Agreement Date: none Last UDS: none

## 2024-04-26 ENCOUNTER — Encounter: Payer: Self-pay | Admitting: Family Medicine

## 2024-04-26 ENCOUNTER — Other Ambulatory Visit: Payer: Self-pay | Admitting: Family Medicine

## 2024-04-26 DIAGNOSIS — M545 Low back pain, unspecified: Secondary | ICD-10-CM

## 2024-04-26 NOTE — Telephone Encounter (Signed)
 See 04/26/24 refill note.

## 2024-04-26 NOTE — Telephone Encounter (Signed)
 Name of Medication: Tramadol  Name of Pharmacy: [facility] [address] Last Fill or Written Date and Quantity: 03/10/24, #50 Last Office Visit and Type: 12/10/23, CPE Next Office Visit and Type: none Last Controlled Substance Agreement Date: none Last UDS: none

## 2024-04-28 NOTE — Telephone Encounter (Signed)
 ERx

## 2024-04-29 ENCOUNTER — Encounter: Payer: Self-pay | Admitting: Family Medicine

## 2024-05-03 DIAGNOSIS — Z0279 Encounter for issue of other medical certificate: Secondary | ICD-10-CM

## 2024-05-03 NOTE — Telephone Encounter (Signed)
 Received filled in FMLA forms for extension/physical therapy Placed in providers box for review.

## 2024-05-08 NOTE — Telephone Encounter (Addendum)
 Filled and in Lisa's box.  Has not returned to GI. Has not scheduled PT previously ordered.

## 2024-05-10 NOTE — Telephone Encounter (Signed)
 Placed ppw on [name] [name]'s desk.

## 2024-05-10 NOTE — Telephone Encounter (Signed)
 Received completed forms. Faxed forms to [phone] as requested. Copy sent to scan. Reached out via Mychart notifying Pt forms have been faxed, and asked if they would like to receive their copy via mail, or pick it up at our office.

## 2024-05-18 LAB — LIPID PANEL
Chol/HDL Ratio: 3.6 ratio (ref 0.0–5.0)
Cholesterol, Total: 177 mg/dL (ref 100–199)
HDL: 49 mg/dL (ref >39)
LDL Chol Calc (NIH): 107 mg/dL — ABNORMAL HIGH (ref 0–99)
Triglycerides: 118 mg/dL (ref 0–149)
VLDL Cholesterol Cal: 21 mg/dL (ref 5–40)

## 2024-05-18 LAB — HEPATIC FUNCTION PANEL
ALT: 17 IU/L (ref 0–44)
AST: 17 IU/L (ref 0–40)
Albumin: 4.3 g/dL (ref 3.8–4.9)
Alkaline Phosphatase: 49 IU/L (ref 44–121)
Bilirubin Total: 1 mg/dL (ref 0.0–1.2)
Bilirubin, Direct: 0.29 mg/dL (ref 0.00–0.40)
Total Protein: 6.9 g/dL (ref 6.0–8.5)

## 2024-05-22 ENCOUNTER — Ambulatory Visit: Payer: Self-pay | Admitting: Family Medicine

## 2024-07-27 ENCOUNTER — Other Ambulatory Visit: Payer: Self-pay | Admitting: Family Medicine

## 2024-07-27 DIAGNOSIS — M545 Low back pain, unspecified: Secondary | ICD-10-CM

## 2024-07-27 NOTE — Telephone Encounter (Signed)
 Name of Medication: Tramadol  Name of Pharmacy: [facility] [address] Last Fill or Written Date and Quantity: 04/28/24, #50 Last Office Visit and Type: 12/10/23, CPE Next Office Visit and Type: 12/10/24, CPE Last Controlled Substance Agreement Date: none Last UDS: none

## 2024-07-28 NOTE — Telephone Encounter (Signed)
 ERx

## 2024-10-18 ENCOUNTER — Other Ambulatory Visit: Payer: Self-pay | Admitting: Family Medicine

## 2024-10-18 DIAGNOSIS — M545 Low back pain, unspecified: Secondary | ICD-10-CM

## 2024-10-18 NOTE — Telephone Encounter (Signed)
 Name of Medication: Tramadol  Name of Pharmacy: [facility] [address] Last Fill or Written Date and Quantity: 07/28/24, #50 Last Office Visit and Type: 12/10/23, CPE Next Office Visit and Type: 12/10/24, CPE Last Controlled Substance Agreement Date: none Last UDS: none

## 2024-10-18 NOTE — Telephone Encounter (Signed)
 ERx

## 2024-10-20 ENCOUNTER — Telehealth: Payer: Self-pay | Admitting: Family Medicine

## 2024-10-20 ENCOUNTER — Encounter: Payer: Self-pay | Admitting: Family Medicine

## 2024-10-20 NOTE — Telephone Encounter (Signed)
 Forms received pre-filled for FMLA  Will fax to [employer] at [phone] as requested  Forms placed in providers box for review

## 2024-10-20 NOTE — Telephone Encounter (Signed)
 Received fax for FMLA paperwork placed in [facility] specialist box

## 2024-10-22 DIAGNOSIS — Z0279 Encounter for issue of other medical certificate: Secondary | ICD-10-CM

## 2024-10-22 NOTE — Telephone Encounter (Signed)
 Completed forms received and faxed to [employer] at [phone]  Copy sent to scan  Copy left at the front desk for patient to pick up, MyChart message left for patient.

## 2024-10-22 NOTE — Telephone Encounter (Signed)
 Forms filled and returned to [facility].

## 2024-11-21 ENCOUNTER — Encounter: Payer: Self-pay | Admitting: Family Medicine

## 2024-11-21 DIAGNOSIS — N401 Enlarged prostate with lower urinary tract symptoms: Secondary | ICD-10-CM

## 2024-11-21 DIAGNOSIS — D500 Iron deficiency anemia secondary to blood loss (chronic): Secondary | ICD-10-CM

## 2024-11-21 DIAGNOSIS — E785 Hyperlipidemia, unspecified: Secondary | ICD-10-CM

## 2024-11-21 DIAGNOSIS — R5383 Other fatigue: Secondary | ICD-10-CM

## 2024-12-06 ENCOUNTER — Other Ambulatory Visit: Payer: Self-pay | Admitting: Family Medicine

## 2024-12-06 DIAGNOSIS — D500 Iron deficiency anemia secondary to blood loss (chronic): Secondary | ICD-10-CM

## 2024-12-09 ENCOUNTER — Ambulatory Visit: Payer: Self-pay | Admitting: Family Medicine

## 2024-12-09 LAB — COMPREHENSIVE METABOLIC PANEL WITH GFR
ALT: 17 IU/L (ref 0–44)
AST: 16 IU/L (ref 0–40)
Albumin: 4.2 g/dL (ref 3.8–4.9)
Alkaline Phosphatase: 50 IU/L (ref 47–123)
BUN/Creatinine Ratio: 12 (ref 9–20)
BUN: 13 mg/dL (ref 6–24)
Bilirubin Total: 0.5 mg/dL (ref 0.0–1.2)
CO2: 23 mmol/L (ref 20–29)
Calcium: 9.1 mg/dL (ref 8.7–10.2)
Chloride: 100 mmol/L (ref 96–106)
Creatinine, Ser: 1.13 mg/dL (ref 0.76–1.27)
Globulin, Total: 2.6 g/dL (ref 1.5–4.5)
Glucose: 111 mg/dL — ABNORMAL HIGH (ref 70–99)
Potassium: 4.2 mmol/L (ref 3.5–5.2)
Sodium: 138 mmol/L (ref 134–144)
Total Protein: 6.8 g/dL (ref 6.0–8.5)
eGFR: 75 mL/min/1.73

## 2024-12-09 LAB — CBC WITH DIFFERENTIAL/PLATELET
Basophils Absolute: 0.1 x10E3/uL (ref 0.0–0.2)
Basos: 1 %
EOS (ABSOLUTE): 0.2 x10E3/uL (ref 0.0–0.4)
Eos: 3 %
Hematocrit: 32.4 % — ABNORMAL LOW (ref 37.5–51.0)
Hemoglobin: 9.7 g/dL — ABNORMAL LOW (ref 13.0–17.7)
Immature Grans (Abs): 0 x10E3/uL (ref 0.0–0.1)
Immature Granulocytes: 0 %
Lymphocytes Absolute: 1.1 x10E3/uL (ref 0.7–3.1)
Lymphs: 16 %
MCH: 23 pg — ABNORMAL LOW (ref 26.6–33.0)
MCHC: 29.9 g/dL — ABNORMAL LOW (ref 31.5–35.7)
MCV: 77 fL — ABNORMAL LOW (ref 79–97)
Monocytes Absolute: 0.7 x10E3/uL (ref 0.1–0.9)
Monocytes: 10 %
Neutrophils Absolute: 5 x10E3/uL (ref 1.4–7.0)
Neutrophils: 70 %
Platelets: 363 x10E3/uL (ref 150–450)
RBC: 4.21 x10E6/uL (ref 4.14–5.80)
RDW: 16 % — ABNORMAL HIGH (ref 11.6–15.4)
WBC: 7.1 x10E3/uL (ref 3.4–10.8)

## 2024-12-09 LAB — IRON AND TIBC
Iron Saturation: 8 % — CL (ref 15–55)
Iron: 25 ug/dL — ABNORMAL LOW (ref 38–169)
Total Iron Binding Capacity: 320 ug/dL (ref 250–450)
UIBC: 295 ug/dL (ref 111–343)

## 2024-12-09 LAB — LIPID PANEL
Chol/HDL Ratio: 3.3 ratio (ref 0.0–5.0)
Cholesterol, Total: 160 mg/dL (ref 100–199)
HDL: 48 mg/dL
LDL Chol Calc (NIH): 94 mg/dL (ref 0–99)
Triglycerides: 97 mg/dL (ref 0–149)
VLDL Cholesterol Cal: 18 mg/dL (ref 5–40)

## 2024-12-09 LAB — TESTOSTERONE: Testosterone: 566 ng/dL (ref 264–916)

## 2024-12-09 LAB — FERRITIN: Ferritin: 19 ng/mL — ABNORMAL LOW (ref 30–400)

## 2024-12-09 LAB — PSA: Prostate Specific Ag, Serum: 2.1 ng/mL (ref 0.0–4.0)

## 2024-12-10 ENCOUNTER — Encounter: Payer: Self-pay | Admitting: Family Medicine

## 2024-12-10 ENCOUNTER — Ambulatory Visit: Admitting: Family Medicine

## 2024-12-10 ENCOUNTER — Other Ambulatory Visit (HOSPITAL_COMMUNITY): Payer: Self-pay | Admitting: Family Medicine

## 2024-12-10 VITALS — BP 118/80 | HR 96 | Temp 98.2°F | Ht 66.93 in | Wt 237.0 lb

## 2024-12-10 DIAGNOSIS — G8929 Other chronic pain: Secondary | ICD-10-CM

## 2024-12-10 DIAGNOSIS — I10 Essential (primary) hypertension: Secondary | ICD-10-CM

## 2024-12-10 DIAGNOSIS — D500 Iron deficiency anemia secondary to blood loss (chronic): Secondary | ICD-10-CM

## 2024-12-10 DIAGNOSIS — Z0001 Encounter for general adult medical examination with abnormal findings: Secondary | ICD-10-CM

## 2024-12-10 DIAGNOSIS — N401 Enlarged prostate with lower urinary tract symptoms: Secondary | ICD-10-CM

## 2024-12-10 DIAGNOSIS — E785 Hyperlipidemia, unspecified: Secondary | ICD-10-CM

## 2024-12-10 DIAGNOSIS — F419 Anxiety disorder, unspecified: Secondary | ICD-10-CM | POA: Insufficient documentation

## 2024-12-10 DIAGNOSIS — K760 Fatty (change of) liver, not elsewhere classified: Secondary | ICD-10-CM

## 2024-12-10 DIAGNOSIS — K648 Other hemorrhoids: Secondary | ICD-10-CM

## 2024-12-10 DIAGNOSIS — K219 Gastro-esophageal reflux disease without esophagitis: Secondary | ICD-10-CM

## 2024-12-10 MED ORDER — OMEPRAZOLE 40 MG PO CPDR: 40.0000 mg | DELAYED_RELEASE_CAPSULE | Freq: Every day | ORAL | 3 refills | Status: AC

## 2024-12-10 MED ORDER — AMLODIPINE BESYLATE 10 MG PO TABS: 10.0000 mg | ORAL_TABLET | Freq: Every day | ORAL | 3 refills | Status: AC

## 2024-12-10 MED ORDER — METHOCARBAMOL 500 MG PO TABS: 500.0000 mg | ORAL_TABLET | Freq: Two times a day (BID) | ORAL | 3 refills | Status: AC | PRN

## 2024-12-10 MED ORDER — TRAMADOL HCL 50 MG PO TABS: ORAL_TABLET | ORAL | 1 refills | Status: AC

## 2024-12-10 MED ORDER — HYDROCHLOROTHIAZIDE 25 MG PO TABS: 25.0000 mg | ORAL_TABLET | Freq: Every day | ORAL | 3 refills | Status: AC

## 2024-12-10 MED ORDER — ATORVASTATIN CALCIUM 20 MG PO TABS: 20.0000 mg | ORAL_TABLET | Freq: Every day | ORAL | 3 refills | Status: AC

## 2024-12-10 MED ORDER — HYDROXYZINE HCL 25 MG PO TABS: 12.5000 mg | ORAL_TABLET | Freq: Two times a day (BID) | ORAL | 3 refills | Status: AC | PRN

## 2024-12-10 NOTE — Progress Notes (Signed)
 Patient lives in [place]. [facility] referral changed to [facility] for site of care due to proximity to home  Sherry Pennant, PharmD, MPH, BCPS, CPP Clinical Pharmacist

## 2024-12-10 NOTE — Assessment & Plan Note (Addendum)
 Recurrent, deterioration over the past year. Thought due to cameron lesions +/- hemorrhoids last treated 2023.  Will set up with iron  infusion in [place], recommend return to GI for f/u - new order placed .

## 2024-12-10 NOTE — Assessment & Plan Note (Signed)
"  Chronic, stable.  Continue current regimen.  "

## 2024-12-10 NOTE — Assessment & Plan Note (Signed)
 PSA stable. Ongoing nocturia, declines medication for this.

## 2024-12-10 NOTE — Assessment & Plan Note (Signed)
 Chronic, stable period on atorvastatin  20mg  daily - continue. The 10-year ASCVD risk score (Arnett DK, et al., 2019) is: 10.6%   Values used to calculate the score:     Age: 59 years     Clinically relevant sex: Male     Is Non-Hispanic African American: Yes     Diabetic: No     Tobacco smoker: No     Systolic Blood Pressure: 118 mmHg     Is BP treated: Yes     HDL Cholesterol: 48 mg/dL     Total Cholesterol: 160 mg/dL

## 2024-12-10 NOTE — Progress Notes (Signed)
 " Ph: [phone] Fax: [phone]   Patient ID: [name] [name]., male    DOB: 1965-12-30, 59 y.o.   MRN: [id number]  This visit was conducted in person.  BP 118/80 (BP Location: Right Arm, Patient Position: Sitting, Cuff Size: Normal)   Pulse 96   Temp 98.2 F (36.8 C) (Oral)   Ht 5' 6.93 (1.7 m)   Wt 237 lb (107.5 kg)   SpO2 99%   BMI 37.20 kg/m    CC: CPE Subjective:   HPI: [name] is a 59 y.o. male presenting on 12/10/2024 for Annual Exam (Would like to discuss vaccines, prevnar, shingrix, tdap, hep b)   See recent note for details.  Chronic IDA presumed from hemorrhoidal bleed s/p hemorrhoid treatment 2023 with significant improvement vs cameron ulcers.  Continues oral iron  replacement every other day as well as omeprazole  40mg  daily.  Last saw GI 06/2022.   Ongoing aches from arthritis to neck, back and knees. Worse with prolonged sitting or stationary. Notes am stiffness to knees and back for a few minutes. No symptoms of active synovitis. Also helps take care of wife with her medical conditions. Has not had physical therapy - planning to go through work for virtual PT. Manages with robaxin  muscle relaxant, tylenol  as needed, tramadol  as needed for breakthrough pain - usually once daily.  Never scheduled PT last year.   Preventative: COLONOSCOPY Date: 12/2012 int hem, diverticulosis ([facility])  COLONOSCOPY 05/2017 inflamed int hem possible source of IDA - offered EGD, diverticulosis, rpt 10 yrs ([facility])  UPPER GI ENDOSCOPY 07/10/2022 - 3cm HH, gastritis, neg H Pylori, empiric dilation ([facility])  Prostate - yearly PSA. Nocturia x2-3, some weakening of stream.  Lung cancer screening - not eligible  Flu shot - declined  COVID vaccine - declined  Tetanus shot - 2011, declined.  Hep B - declined Prevnar-20 - declines Shingrix - declined Seat belt use discussed  Sunscreen use discussed. No changing moles on skin.  Sleep - averaging 8 hours/night Non  smoker  Alcohol - none  Dentist q6 mo  Eye exam yearly  Bowel - occ constipation managed with metamucil Bladder - no incontinence   Billing specialist - [facility] Lives with wife and son Activity: tries to walk daily  Diet: good water, fruits/vegetables daily, fish 1x/wk, some red meat     Relevant past medical, surgical, family and social history reviewed and updated as indicated. Interim medical history since our last visit reviewed. Allergies and medications reviewed and updated. Outpatient Medications Prior to Visit  Medication Sig Dispense Refill   acetaminophen  (TYLENOL ) 500 MG tablet Take 2 tablets (1,000 mg total) by mouth 3 (three) times daily as needed for moderate pain.     diclofenac  Sodium (VOLTAREN ) 1 % GEL Apply 2 g topically 3 (three) times daily. (Patient taking differently: Apply 2 g topically daily as needed (pain).) 100 g 3   docusate sodium  (COLACE) 100 MG capsule Take 1 capsule (100 mg total) by mouth daily. (Patient taking differently: Take 100 mg by mouth daily as needed for mild constipation or moderate constipation.)     ferrous sulfate  325 (65 FE) MG tablet Take 1 tablet (325 mg total) by mouth daily with breakfast.     psyllium (METAMUCIL SMOOTH TEXTURE) 58.6 % powder Take 1 packet by mouth daily.     amLODipine  (NORVASC ) 10 MG tablet Take 1 tablet (10 mg total) by mouth daily. 90 tablet 4   atorvastatin  (LIPITOR) 20 MG tablet Take 1 tablet (  20 mg total) by mouth daily. 90 tablet 3   hydrochlorothiazide  (HYDRODIURIL ) 25 MG tablet Take 1 tablet (25 mg total) by mouth daily. 90 tablet 4   hydrOXYzine  (ATARAX ) 25 MG tablet Take 0.5-1 tablets (12.5-25 mg total) by mouth 2 (two) times daily as needed for anxiety (sedation precautions). 30 tablet 2   methocarbamol  (ROBAXIN ) 500 MG tablet Take 1 tablet (500 mg total) by mouth 2 (two) times daily as needed for muscle spasms (sedation precautions). 30 tablet 3   omeprazole  (PRILOSEC) 40 MG capsule Take 1 capsule (40 mg  total) by mouth daily. 90 capsule 2   traMADol  (ULTRAM ) 50 MG tablet TAKE 1 TABLET BY MOUTH EVERY 12 HOURS AS NEEDED FOR MODERATE PAIN OR CHRONIC PAIN 50 tablet 0   No facility-administered medications prior to visit.     Per HPI unless specifically indicated in ROS section below Review of Systems  Constitutional:  Negative for activity change, appetite change, chills, fatigue, fever and unexpected weight change.  HENT:  Negative for hearing loss.   Eyes:  Negative for visual disturbance.  Respiratory:  Negative for cough, chest tightness, shortness of breath and wheezing.   Cardiovascular:  Negative for chest pain, palpitations and leg swelling.  Gastrointestinal:  Negative for abdominal distention, abdominal pain, blood in stool, constipation, diarrhea, nausea and vomiting.  Genitourinary:  Negative for difficulty urinating and hematuria.  Musculoskeletal:  Positive for arthralgias and back pain. Negative for myalgias and neck pain.  Skin:  Negative for rash.  Neurological:  Negative for dizziness, seizures, syncope and headaches.  Hematological:  Negative for adenopathy. Does not bruise/bleed easily.  Psychiatric/Behavioral:  Negative for dysphoric mood. The patient is nervous/anxious.     Objective:  BP 118/80 (BP Location: Right Arm, Patient Position: Sitting, Cuff Size: Normal)   Pulse 96   Temp 98.2 F (36.8 C) (Oral)   Ht 5' 6.93 (1.7 m)   Wt 237 lb (107.5 kg)   SpO2 99%   BMI 37.20 kg/m   Wt Readings from Last 3 Encounters:  12/10/24 237 lb (107.5 kg)  12/10/23 229 lb 8 oz (104.1 kg)  11/03/23 230 lb 8 oz (104.6 kg)      Physical Exam Vitals and nursing note reviewed.  Constitutional:      General: He is not in acute distress.    Appearance: Normal appearance. He is well-developed. He is not ill-appearing.  HENT:     Head: Normocephalic and atraumatic.     Right Ear: Hearing, tympanic membrane, ear canal and external ear normal.     Left Ear: Hearing, tympanic  membrane, ear canal and external ear normal.     Mouth/Throat:     Mouth: Mucous membranes are moist.     Pharynx: Oropharynx is clear. No oropharyngeal exudate or posterior oropharyngeal erythema.  Eyes:     General: No scleral icterus.    Extraocular Movements: Extraocular movements intact.     Conjunctiva/sclera: Conjunctivae normal.     Pupils: Pupils are equal, round, and reactive to light.  Neck:     Thyroid: No thyroid mass or thyromegaly.  Cardiovascular:     Rate and Rhythm: Normal rate and regular rhythm.     Pulses: Normal pulses.          Radial pulses are 2+ on the right side and 2+ on the left side.     Heart sounds: Normal heart sounds. No murmur heard. Pulmonary:     Effort: Pulmonary effort is normal. No respiratory distress.  Breath sounds: Normal breath sounds. No wheezing, rhonchi or rales.  Abdominal:     General: Bowel sounds are normal. There is no distension.     Palpations: Abdomen is soft. There is no mass.     Tenderness: There is no abdominal tenderness. There is no guarding or rebound.     Hernia: No hernia is present.  Musculoskeletal:        General: Normal range of motion.     Cervical back: Normal range of motion and neck supple.     Right lower leg: No edema.     Left lower leg: No edema.  Lymphadenopathy:     Cervical: No cervical adenopathy.  Skin:    General: Skin is warm and dry.     Findings: No rash.  Neurological:     General: No focal deficit present.     Mental Status: He is alert and oriented to person, place, and time.  Psychiatric:        Mood and Affect: Mood normal.        Behavior: Behavior normal.        Thought Content: Thought content normal.        Judgment: Judgment normal.       Results for orders placed or performed in visit on 12/06/24  Iron  and TIBC   Collection Time: 12/08/24  7:12 AM  Result Value Ref Range   Total Iron  Binding Capacity 320 250 - 450 ug/dL   UIBC 704 888 - 656 ug/dL   Iron  25 (L) 38 -  169 ug/dL   Iron  Saturation 8 (LL) 15 - 55 %  Ferritin   Collection Time: 12/08/24  7:12 AM  Result Value Ref Range   Ferritin 19 (L) 30 - 400 ng/mL   Lab Results  Component Value Date   CHOL 160 12/08/2024   HDL 48 12/08/2024   LDLCALC 94 12/08/2024   TRIG 97 12/08/2024   CHOLHDL 3.3 12/08/2024    Lab Results  Component Value Date   WBC 7.1 12/08/2024   HGB 9.7 (L) 12/08/2024   HCT 32.4 (L) 12/08/2024   MCV 77 (L) 12/08/2024   PLT 363 12/08/2024    Lab Results  Component Value Date   NA 138 12/08/2024   CL 100 12/08/2024   K 4.2 12/08/2024   CO2 23 12/08/2024   BUN 13 12/08/2024   CREATININE 1.13 12/08/2024   EGFR 75 12/08/2024   CALCIUM  9.1 12/08/2024   PHOS 4.1 12/15/2015   ALBUMIN 4.2 12/08/2024   GLUCOSE 111 (H) 12/08/2024    Lab Results  Component Value Date   ALT 17 12/08/2024   AST 16 12/08/2024   ALKPHOS 50 12/08/2024   BILITOT 0.5 12/08/2024    Lab Results  Component Value Date   PSA1 2.1 12/08/2024   PSA1 1.5 11/21/2023   PSA1 1.9 11/22/2022   PSA 1.3 10/19/2012    Lab Results  Component Value Date   TESTOSTERONE  566 12/08/2024   Lab Results  Component Value Date   TSH 1.150 12/19/2020    Assessment & Plan:   Problem List Items Addressed This Visit     Encounter for general adult medical examination with abnormal findings - Primary (Chronic)   Preventative protocols reviewed and updated unless pt declined. Discussed healthy diet and lifestyle.  He declines all vaccines.       Encounter for chronic pain management (Chronic)   [place] CSRS reviewed.       Severe obesity (BMI  35.0-39.9) with comorbidity (HCC)   Continue to encourage health ydiet and lifestyle choices to effect sustainable weight loss.       Iron  deficiency anemia   Recurrent, deterioration over the past year. Thought due to cameron lesions +/- hemorrhoids last treated 2023.  Will set up with iron  infusion in [place], recommend return to GI for f/u - new order  placed .       Relevant Orders   CBC with Differential/Platelet   Ferritin   IBC panel   Ambulatory referral to Gastroenterology   HTN (hypertension)   Chronic, stable. Continue current regimen.       Relevant Medications   amLODipine  (NORVASC ) 10 MG tablet   atorvastatin  (LIPITOR) 20 MG tablet   hydrochlorothiazide  (HYDRODIURIL ) 25 MG tablet   HLD (hyperlipidemia)   Chronic, stable period on atorvastatin  20mg  daily - continue. The 10-year ASCVD risk score (Arnett DK, et al., 2019) is: 10.6%   Values used to calculate the score:     Age: 65 years     Clinically relevant sex: Male     Is Non-Hispanic African American: Yes     Diabetic: No     Tobacco smoker: No     Systolic Blood Pressure: 118 mmHg     Is BP treated: Yes     HDL Cholesterol: 48 mg/dL     Total Cholesterol: 160 mg/dL       Relevant Medications   amLODipine  (NORVASC ) 10 MG tablet   atorvastatin  (LIPITOR) 20 MG tablet   hydrochlorothiazide  (HYDRODIURIL ) 25 MG tablet   Bilateral knee pain   Mild OA/DJD on xrays 2021. Encouraged weight loss      Chronic low back pain   Continue PRN methocarbamol , tramadol .  DDD/OA related pains.  Again recommend PT - he states he will complete this through work virtual PT program which is now being offered free to him.  Encouraged weight loss as best treatment for osteoarthritis - ideal weight <200lbs, but start with 5% weight loss (12 lbs).      Relevant Medications   methocarbamol  (ROBAXIN ) 500 MG tablet   traMADol  (ULTRAM ) 50 MG tablet   BPH (benign prostatic hyperplasia)   PSA stable. Ongoing nocturia, declines medication for this.       Internal hemorrhoids   S/p hemorrhoidal banding x3 ([facility] 2023)      Relevant Medications   amLODipine  (NORVASC ) 10 MG tablet   atorvastatin  (LIPITOR) 20 MG tablet   hydrochlorothiazide  (HYDRODIURIL ) 25 MG tablet   Other Relevant Orders   Ambulatory referral to Gastroenterology   Hepatic steatosis   LFTs  stable. Liver imaging reassuring on latest CT       GERD (gastroesophageal reflux disease)   Continue omeprazole  40mg  daily.       Relevant Medications   omeprazole  (PRILOSEC) 40 MG capsule   Anxiety   Continue PRN hydroxyzine .       Relevant Medications   hydrOXYzine  (ATARAX ) 25 MG tablet     Meds ordered this encounter  Medications   amLODipine  (NORVASC ) 10 MG tablet    Sig: Take 1 tablet (10 mg total) by mouth daily.    Dispense:  90 tablet    Refill:  3   atorvastatin  (LIPITOR) 20 MG tablet    Sig: Take 1 tablet (20 mg total) by mouth daily.    Dispense:  90 tablet    Refill:  3   hydrochlorothiazide  (HYDRODIURIL ) 25 MG tablet    Sig: Take 1 tablet (25 mg total)  by mouth daily.    Dispense:  90 tablet    Refill:  3   hydrOXYzine  (ATARAX ) 25 MG tablet    Sig: Take 0.5-1 tablets (12.5-25 mg total) by mouth 2 (two) times daily as needed for anxiety (sedation precautions).    Dispense:  30 tablet    Refill:  3   methocarbamol  (ROBAXIN ) 500 MG tablet    Sig: Take 1 tablet (500 mg total) by mouth 2 (two) times daily as needed for muscle spasms (sedation precautions).    Dispense:  30 tablet    Refill:  3   omeprazole  (PRILOSEC) 40 MG capsule    Sig: Take 1 capsule (40 mg total) by mouth daily.    Dispense:  90 capsule    Refill:  3   traMADol  (ULTRAM ) 50 MG tablet    Sig: TAKE 1 TABLET BY MOUTH EVERY 12 HOURS AS NEEDED FOR MODERATE PAIN OR CHRONIC PAIN    Dispense:  50 tablet    Refill:  1    Orders Placed This Encounter  Procedures   CBC with Differential/Platelet    Standing Status:   Future    Expiration Date:   12/10/2025   Ferritin    Standing Status:   Future    Expiration Date:   12/10/2025   IBC panel    Standing Status:   Future    Expiration Date:   12/10/2025   Ambulatory referral to Gastroenterology    Referral Priority:   Routine    Referral Type:   Consultation    Referral Reason:   Specialty Services Required    Number of Visits Requested:    1    Patient Instructions  I will order repeat iron  infusion and have placed new referral to GI - You may call [facility] to schedule an appointment at [phone].   Schedule lab visit 1 month after iron  infusion to recheck iron  levels.  Set up virtual physical therapy through work  Return as needed or in 1 year for next physical   Follow up plan: Return in about 1 year (around 12/10/2025) for annual exam, prior fasting for blood work.  Anton Blas, MD   "

## 2024-12-10 NOTE — Assessment & Plan Note (Addendum)
 Preventative protocols reviewed and updated unless pt declined. Discussed healthy diet and lifestyle.  He declines all vaccines.

## 2024-12-10 NOTE — Patient Instructions (Addendum)
 I will order repeat iron  infusion and have placed new referral to GI - You may call [facility] to schedule an appointment at [phone].   Schedule lab visit 1 month after iron  infusion to recheck iron  levels.  Set up virtual physical therapy through work  Return as needed or in 1 year for next physical

## 2024-12-10 NOTE — Assessment & Plan Note (Signed)
 Mild OA/DJD on xrays 2021. Encouraged weight loss

## 2024-12-10 NOTE — Assessment & Plan Note (Signed)
-  Continue omeprazole  40mg  daily

## 2024-12-10 NOTE — Assessment & Plan Note (Signed)
 Continue to encourage healthy diet and lifestyle choices to effect sustainable weight loss.

## 2024-12-10 NOTE — Assessment & Plan Note (Signed)
 LFTs stable. Liver imaging reassuring on latest CT

## 2024-12-10 NOTE — Assessment & Plan Note (Signed)
[place] CSRS reviewed  ?

## 2024-12-10 NOTE — Assessment & Plan Note (Signed)
 S/p hemorrhoidal banding x3 ([facility] 2023)

## 2024-12-10 NOTE — Assessment & Plan Note (Signed)
 Continue PRN hydroxyzine.

## 2024-12-10 NOTE — Assessment & Plan Note (Addendum)
 Continue PRN methocarbamol , tramadol .  DDD/OA related pains.  Again recommend PT - he states he will complete this through work virtual PT program which is now being offered free to him.  Encouraged weight loss as best treatment for osteoarthritis - ideal weight <200lbs, but start with 5% weight loss (12 lbs).

## 2024-12-20 ENCOUNTER — Telehealth (HOSPITAL_COMMUNITY): Payer: Self-pay

## 2025-01-10 ENCOUNTER — Ambulatory Visit

## 2025-01-18 ENCOUNTER — Ambulatory Visit
